# Patient Record
Sex: Female | Born: 1945 | Race: White | Hispanic: No | Marital: Married | Smoking: Never smoker
Health system: Southern US, Community
[De-identification: ages and names within clinical notes are randomized; demographics above are authoritative.]

## PROBLEM LIST (undated history)

## (undated) DIAGNOSIS — N952 Postmenopausal atrophic vaginitis: Secondary | ICD-10-CM

## (undated) DIAGNOSIS — M8580 Other specified disorders of bone density and structure, unspecified site: Secondary | ICD-10-CM

## (undated) DIAGNOSIS — I4729 Other ventricular tachycardia: Secondary | ICD-10-CM

## (undated) DIAGNOSIS — Z889 Allergy status to unspecified drugs, medicaments and biological substances status: Secondary | ICD-10-CM

## (undated) DIAGNOSIS — R87610 Atypical squamous cells of undetermined significance on cytologic smear of cervix (ASC-US): Secondary | ICD-10-CM

## (undated) DIAGNOSIS — B009 Herpesviral infection, unspecified: Secondary | ICD-10-CM

## (undated) DIAGNOSIS — I2510 Atherosclerotic heart disease of native coronary artery without angina pectoris: Secondary | ICD-10-CM

## (undated) DIAGNOSIS — S199XXA Unspecified injury of neck, initial encounter: Secondary | ICD-10-CM

## (undated) DIAGNOSIS — I10 Essential (primary) hypertension: Secondary | ICD-10-CM

## (undated) HISTORY — PX: TONSILLECTOMY: SHX5217

## (undated) HISTORY — DX: Atypical squamous cells of undetermined significance on cytologic smear of cervix (ASC-US): R87.610

## (undated) HISTORY — DX: Essential (primary) hypertension: I10

## (undated) HISTORY — PX: FOOT SURGERY: SHX648

## (undated) HISTORY — DX: Atherosclerotic heart disease of native coronary artery without angina pectoris: I25.10

## (undated) HISTORY — DX: Other ventricular tachycardia: I47.29

## (undated) HISTORY — PX: MOUTH SURGERY: SHX715

## (undated) HISTORY — DX: Unspecified injury of neck, initial encounter: S19.9XXA

## (undated) HISTORY — DX: Other specified disorders of bone density and structure, unspecified site: M85.80

## (undated) HISTORY — DX: Herpesviral infection, unspecified: B00.9

## (undated) HISTORY — DX: Allergy status to unspecified drugs, medicaments and biological substances: Z88.9

## (undated) HISTORY — DX: Postmenopausal atrophic vaginitis: N95.2

---
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
## 2000-09-08 ENCOUNTER — Encounter: Payer: Self-pay | Admitting: Internal Medicine

## 2000-09-08 ENCOUNTER — Encounter: Admission: RE | Admit: 2000-09-08 | Discharge: 2000-09-08 | Payer: Self-pay | Admitting: Internal Medicine

## 2000-10-30 ENCOUNTER — Other Ambulatory Visit: Admission: RE | Admit: 2000-10-30 | Discharge: 2000-10-30 | Payer: Self-pay | Admitting: Obstetrics and Gynecology

## 2000-10-30 ENCOUNTER — Encounter (INDEPENDENT_AMBULATORY_CARE_PROVIDER_SITE_OTHER): Payer: Self-pay | Admitting: Specialist

## 2004-11-01 ENCOUNTER — Ambulatory Visit: Payer: Self-pay | Admitting: Internal Medicine

## 2004-11-14 ENCOUNTER — Ambulatory Visit: Payer: Self-pay | Admitting: Internal Medicine

## 2004-11-21 ENCOUNTER — Ambulatory Visit: Payer: Self-pay | Admitting: Internal Medicine

## 2004-11-28 ENCOUNTER — Ambulatory Visit: Payer: Self-pay | Admitting: Internal Medicine

## 2004-12-12 ENCOUNTER — Ambulatory Visit: Payer: Self-pay | Admitting: Internal Medicine

## 2004-12-19 ENCOUNTER — Ambulatory Visit: Payer: Self-pay | Admitting: Internal Medicine

## 2005-01-07 ENCOUNTER — Ambulatory Visit: Payer: Self-pay | Admitting: Internal Medicine

## 2005-01-22 ENCOUNTER — Ambulatory Visit: Payer: Self-pay | Admitting: Internal Medicine

## 2005-02-06 ENCOUNTER — Ambulatory Visit: Payer: Self-pay | Admitting: Internal Medicine

## 2005-02-20 ENCOUNTER — Ambulatory Visit: Payer: Self-pay | Admitting: Internal Medicine

## 2005-03-06 ENCOUNTER — Ambulatory Visit: Payer: Self-pay | Admitting: Internal Medicine

## 2005-03-20 ENCOUNTER — Ambulatory Visit: Payer: Self-pay | Admitting: Internal Medicine

## 2005-04-03 ENCOUNTER — Ambulatory Visit: Payer: Self-pay | Admitting: Internal Medicine

## 2005-04-25 ENCOUNTER — Ambulatory Visit: Payer: Self-pay | Admitting: Internal Medicine

## 2005-05-10 ENCOUNTER — Ambulatory Visit: Payer: Self-pay | Admitting: Internal Medicine

## 2005-05-22 ENCOUNTER — Ambulatory Visit: Payer: Self-pay | Admitting: Internal Medicine

## 2005-06-14 ENCOUNTER — Ambulatory Visit: Payer: Self-pay | Admitting: Internal Medicine

## 2005-06-25 ENCOUNTER — Ambulatory Visit: Payer: Self-pay | Admitting: Internal Medicine

## 2005-07-10 ENCOUNTER — Ambulatory Visit: Payer: Self-pay | Admitting: Internal Medicine

## 2005-07-31 ENCOUNTER — Ambulatory Visit: Payer: Self-pay | Admitting: Internal Medicine

## 2005-08-14 ENCOUNTER — Ambulatory Visit: Payer: Self-pay | Admitting: Internal Medicine

## 2005-08-29 ENCOUNTER — Ambulatory Visit: Payer: Self-pay | Admitting: Internal Medicine

## 2005-09-04 ENCOUNTER — Ambulatory Visit: Payer: Self-pay | Admitting: Internal Medicine

## 2005-09-10 ENCOUNTER — Ambulatory Visit: Payer: Self-pay | Admitting: Internal Medicine

## 2005-09-16 ENCOUNTER — Ambulatory Visit: Payer: Self-pay | Admitting: Internal Medicine

## 2005-10-02 ENCOUNTER — Ambulatory Visit: Payer: Self-pay | Admitting: Internal Medicine

## 2005-10-16 ENCOUNTER — Ambulatory Visit: Payer: Self-pay | Admitting: Internal Medicine

## 2005-10-23 ENCOUNTER — Ambulatory Visit: Payer: Self-pay | Admitting: Internal Medicine

## 2005-11-11 ENCOUNTER — Ambulatory Visit: Payer: Self-pay | Admitting: Internal Medicine

## 2005-12-10 ENCOUNTER — Ambulatory Visit: Payer: Self-pay | Admitting: Internal Medicine

## 2005-12-19 ENCOUNTER — Ambulatory Visit: Payer: Self-pay | Admitting: Internal Medicine

## 2005-12-27 ENCOUNTER — Ambulatory Visit: Payer: Self-pay | Admitting: Internal Medicine

## 2006-01-06 ENCOUNTER — Ambulatory Visit: Payer: Self-pay | Admitting: Internal Medicine

## 2006-01-15 ENCOUNTER — Ambulatory Visit: Payer: Self-pay | Admitting: Internal Medicine

## 2006-01-21 ENCOUNTER — Ambulatory Visit: Payer: Self-pay | Admitting: Internal Medicine

## 2006-01-29 ENCOUNTER — Ambulatory Visit: Payer: Self-pay | Admitting: Internal Medicine

## 2006-02-12 ENCOUNTER — Ambulatory Visit: Payer: Self-pay | Admitting: Internal Medicine

## 2006-02-28 ENCOUNTER — Ambulatory Visit: Payer: Self-pay | Admitting: Internal Medicine

## 2006-03-17 ENCOUNTER — Ambulatory Visit: Payer: Self-pay | Admitting: Internal Medicine

## 2006-04-02 ENCOUNTER — Ambulatory Visit: Payer: Self-pay | Admitting: Internal Medicine

## 2006-04-16 ENCOUNTER — Ambulatory Visit: Payer: Self-pay | Admitting: Internal Medicine

## 2006-04-29 ENCOUNTER — Ambulatory Visit: Payer: Self-pay | Admitting: Internal Medicine

## 2006-05-14 ENCOUNTER — Ambulatory Visit: Payer: Self-pay | Admitting: Internal Medicine

## 2006-05-28 ENCOUNTER — Ambulatory Visit: Payer: Self-pay | Admitting: Internal Medicine

## 2006-05-29 ENCOUNTER — Ambulatory Visit: Payer: Self-pay | Admitting: Internal Medicine

## 2006-06-06 ENCOUNTER — Ambulatory Visit: Payer: Self-pay | Admitting: Internal Medicine

## 2006-07-04 ENCOUNTER — Ambulatory Visit: Payer: Self-pay | Admitting: Internal Medicine

## 2006-07-07 ENCOUNTER — Ambulatory Visit: Payer: Self-pay | Admitting: Internal Medicine

## 2006-07-22 ENCOUNTER — Ambulatory Visit: Payer: Self-pay | Admitting: Internal Medicine

## 2006-08-28 ENCOUNTER — Ambulatory Visit: Payer: Self-pay | Admitting: Internal Medicine

## 2006-09-12 ENCOUNTER — Ambulatory Visit: Payer: Self-pay | Admitting: Internal Medicine

## 2006-09-16 ENCOUNTER — Ambulatory Visit: Payer: Self-pay | Admitting: Internal Medicine

## 2006-09-23 ENCOUNTER — Ambulatory Visit: Payer: Self-pay | Admitting: Internal Medicine

## 2006-10-01 ENCOUNTER — Ambulatory Visit: Payer: Self-pay | Admitting: Internal Medicine

## 2006-10-10 ENCOUNTER — Ambulatory Visit: Payer: Self-pay | Admitting: Internal Medicine

## 2006-10-23 ENCOUNTER — Ambulatory Visit: Payer: Self-pay | Admitting: Internal Medicine

## 2006-11-19 ENCOUNTER — Encounter: Admission: RE | Admit: 2006-11-19 | Discharge: 2007-02-17 | Payer: Self-pay | Admitting: Emergency Medicine

## 2006-11-25 ENCOUNTER — Ambulatory Visit: Payer: Self-pay | Admitting: Internal Medicine

## 2006-12-03 ENCOUNTER — Ambulatory Visit: Payer: Self-pay | Admitting: Internal Medicine

## 2006-12-19 ENCOUNTER — Ambulatory Visit: Payer: Self-pay | Admitting: Internal Medicine

## 2006-12-25 ENCOUNTER — Ambulatory Visit: Payer: Self-pay | Admitting: Internal Medicine

## 2006-12-30 ENCOUNTER — Ambulatory Visit: Payer: Self-pay | Admitting: Internal Medicine

## 2007-01-12 ENCOUNTER — Ambulatory Visit: Payer: Self-pay | Admitting: Internal Medicine

## 2007-02-03 ENCOUNTER — Ambulatory Visit: Payer: Self-pay | Admitting: Internal Medicine

## 2007-02-18 ENCOUNTER — Ambulatory Visit: Payer: Self-pay | Admitting: Internal Medicine

## 2007-03-06 ENCOUNTER — Ambulatory Visit: Payer: Self-pay | Admitting: Internal Medicine

## 2007-03-17 ENCOUNTER — Ambulatory Visit: Payer: Self-pay | Admitting: Internal Medicine

## 2007-04-09 ENCOUNTER — Ambulatory Visit: Payer: Self-pay | Admitting: Internal Medicine

## 2007-04-24 ENCOUNTER — Ambulatory Visit: Payer: Self-pay | Admitting: Internal Medicine

## 2007-04-28 ENCOUNTER — Ambulatory Visit: Payer: Self-pay | Admitting: Internal Medicine

## 2007-05-11 ENCOUNTER — Ambulatory Visit: Payer: Self-pay | Admitting: Internal Medicine

## 2007-06-10 ENCOUNTER — Ambulatory Visit: Payer: Self-pay | Admitting: Internal Medicine

## 2007-06-24 ENCOUNTER — Ambulatory Visit: Payer: Self-pay | Admitting: Internal Medicine

## 2007-07-16 ENCOUNTER — Ambulatory Visit: Payer: Self-pay | Admitting: Internal Medicine

## 2007-07-21 ENCOUNTER — Ambulatory Visit: Payer: Self-pay | Admitting: Internal Medicine

## 2007-07-22 ENCOUNTER — Ambulatory Visit: Payer: Self-pay | Admitting: Internal Medicine

## 2007-08-05 ENCOUNTER — Ambulatory Visit: Payer: Self-pay | Admitting: Internal Medicine

## 2007-09-15 ENCOUNTER — Ambulatory Visit: Payer: Self-pay | Admitting: Internal Medicine

## 2007-09-25 ENCOUNTER — Ambulatory Visit: Payer: Self-pay | Admitting: Internal Medicine

## 2007-10-05 ENCOUNTER — Ambulatory Visit: Payer: Self-pay | Admitting: Internal Medicine

## 2007-10-07 ENCOUNTER — Ambulatory Visit: Payer: Self-pay | Admitting: Internal Medicine

## 2007-10-27 ENCOUNTER — Ambulatory Visit: Payer: Self-pay | Admitting: Internal Medicine

## 2007-11-03 ENCOUNTER — Ambulatory Visit: Payer: Self-pay | Admitting: Internal Medicine

## 2007-11-16 ENCOUNTER — Ambulatory Visit: Payer: Self-pay | Admitting: Internal Medicine

## 2007-12-02 ENCOUNTER — Ambulatory Visit: Payer: Self-pay | Admitting: Internal Medicine

## 2007-12-09 ENCOUNTER — Ambulatory Visit: Payer: Self-pay | Admitting: Internal Medicine

## 2007-12-11 ENCOUNTER — Encounter: Admission: RE | Admit: 2007-12-11 | Discharge: 2008-01-29 | Payer: Self-pay | Admitting: Emergency Medicine

## 2007-12-18 ENCOUNTER — Ambulatory Visit: Payer: Self-pay | Admitting: Internal Medicine

## 2008-01-01 ENCOUNTER — Ambulatory Visit: Payer: Self-pay | Admitting: Internal Medicine

## 2008-01-15 ENCOUNTER — Ambulatory Visit: Payer: Self-pay | Admitting: Internal Medicine

## 2008-01-18 ENCOUNTER — Ambulatory Visit: Payer: Self-pay | Admitting: Internal Medicine

## 2008-01-20 ENCOUNTER — Ambulatory Visit: Payer: Self-pay | Admitting: Internal Medicine

## 2008-01-27 ENCOUNTER — Ambulatory Visit: Payer: Self-pay | Admitting: Internal Medicine

## 2008-02-12 ENCOUNTER — Telehealth: Payer: Self-pay | Admitting: Internal Medicine

## 2008-02-24 ENCOUNTER — Ambulatory Visit: Payer: Self-pay | Admitting: Internal Medicine

## 2008-02-29 ENCOUNTER — Ambulatory Visit: Payer: Self-pay | Admitting: Internal Medicine

## 2008-03-18 ENCOUNTER — Ambulatory Visit: Payer: Self-pay | Admitting: Internal Medicine

## 2008-03-30 ENCOUNTER — Ambulatory Visit: Payer: Self-pay | Admitting: Internal Medicine

## 2008-04-05 ENCOUNTER — Telehealth: Payer: Self-pay | Admitting: Internal Medicine

## 2008-04-08 ENCOUNTER — Ambulatory Visit: Payer: Self-pay | Admitting: Internal Medicine

## 2008-04-14 DIAGNOSIS — J4520 Mild intermittent asthma, uncomplicated: Secondary | ICD-10-CM

## 2008-04-14 DIAGNOSIS — J309 Allergic rhinitis, unspecified: Secondary | ICD-10-CM | POA: Insufficient documentation

## 2008-04-20 ENCOUNTER — Ambulatory Visit: Payer: Self-pay | Admitting: Internal Medicine

## 2008-05-11 ENCOUNTER — Ambulatory Visit: Payer: Self-pay | Admitting: Internal Medicine

## 2008-06-02 ENCOUNTER — Ambulatory Visit: Payer: Self-pay | Admitting: Internal Medicine

## 2008-06-20 ENCOUNTER — Ambulatory Visit: Payer: Self-pay | Admitting: Internal Medicine

## 2008-07-06 ENCOUNTER — Ambulatory Visit: Payer: Self-pay | Admitting: Internal Medicine

## 2008-07-15 ENCOUNTER — Ambulatory Visit: Payer: Self-pay | Admitting: Internal Medicine

## 2008-07-29 ENCOUNTER — Ambulatory Visit: Payer: Self-pay | Admitting: Internal Medicine

## 2008-08-12 ENCOUNTER — Ambulatory Visit: Payer: Self-pay | Admitting: Internal Medicine

## 2008-08-19 ENCOUNTER — Ambulatory Visit: Payer: Self-pay | Admitting: Internal Medicine

## 2008-08-31 ENCOUNTER — Ambulatory Visit: Payer: Self-pay | Admitting: Internal Medicine

## 2008-09-06 ENCOUNTER — Ambulatory Visit: Payer: Self-pay | Admitting: Internal Medicine

## 2008-09-21 ENCOUNTER — Ambulatory Visit: Payer: Self-pay | Admitting: Internal Medicine

## 2008-09-26 ENCOUNTER — Ambulatory Visit: Payer: Self-pay | Admitting: Internal Medicine

## 2008-10-04 ENCOUNTER — Ambulatory Visit: Payer: Self-pay | Admitting: Internal Medicine

## 2008-10-17 ENCOUNTER — Telehealth (INDEPENDENT_AMBULATORY_CARE_PROVIDER_SITE_OTHER): Payer: Self-pay | Admitting: *Deleted

## 2008-11-14 ENCOUNTER — Telehealth: Payer: Self-pay | Admitting: Internal Medicine

## 2008-11-24 ENCOUNTER — Ambulatory Visit: Payer: Self-pay | Admitting: Internal Medicine

## 2008-11-30 ENCOUNTER — Ambulatory Visit: Payer: Self-pay | Admitting: Internal Medicine

## 2008-12-09 ENCOUNTER — Ambulatory Visit: Payer: Self-pay | Admitting: Internal Medicine

## 2008-12-22 ENCOUNTER — Ambulatory Visit: Payer: Self-pay | Admitting: Internal Medicine

## 2008-12-27 ENCOUNTER — Ambulatory Visit: Payer: Self-pay | Admitting: Internal Medicine

## 2009-01-03 ENCOUNTER — Telehealth (INDEPENDENT_AMBULATORY_CARE_PROVIDER_SITE_OTHER): Payer: Self-pay | Admitting: *Deleted

## 2009-01-13 ENCOUNTER — Ambulatory Visit: Payer: Self-pay | Admitting: Internal Medicine

## 2009-01-23 ENCOUNTER — Ambulatory Visit: Payer: Self-pay | Admitting: Internal Medicine

## 2009-01-31 ENCOUNTER — Ambulatory Visit: Payer: Self-pay | Admitting: Internal Medicine

## 2009-02-02 ENCOUNTER — Ambulatory Visit: Payer: Self-pay | Admitting: Internal Medicine

## 2009-02-06 ENCOUNTER — Ambulatory Visit: Payer: Self-pay | Admitting: Internal Medicine

## 2009-02-28 ENCOUNTER — Ambulatory Visit: Payer: Self-pay | Admitting: Internal Medicine

## 2009-03-09 ENCOUNTER — Ambulatory Visit: Payer: Self-pay | Admitting: Internal Medicine

## 2009-03-14 ENCOUNTER — Ambulatory Visit: Payer: Self-pay | Admitting: Internal Medicine

## 2009-03-21 ENCOUNTER — Ambulatory Visit: Payer: Self-pay | Admitting: Internal Medicine

## 2009-03-28 ENCOUNTER — Ambulatory Visit: Payer: Self-pay | Admitting: Internal Medicine

## 2009-04-04 ENCOUNTER — Ambulatory Visit: Payer: Self-pay | Admitting: Internal Medicine

## 2009-04-13 ENCOUNTER — Ambulatory Visit: Payer: Self-pay | Admitting: Internal Medicine

## 2009-04-19 ENCOUNTER — Ambulatory Visit: Payer: Self-pay | Admitting: Internal Medicine

## 2009-04-25 ENCOUNTER — Ambulatory Visit: Payer: Self-pay | Admitting: Internal Medicine

## 2009-05-05 ENCOUNTER — Ambulatory Visit: Payer: Self-pay | Admitting: Internal Medicine

## 2009-05-10 ENCOUNTER — Ambulatory Visit: Payer: Self-pay | Admitting: Internal Medicine

## 2009-05-23 ENCOUNTER — Ambulatory Visit: Payer: Self-pay | Admitting: Internal Medicine

## 2009-06-07 ENCOUNTER — Ambulatory Visit: Payer: Self-pay | Admitting: Internal Medicine

## 2009-06-16 ENCOUNTER — Ambulatory Visit: Payer: Self-pay | Admitting: Internal Medicine

## 2009-06-21 ENCOUNTER — Ambulatory Visit: Payer: Self-pay | Admitting: Internal Medicine

## 2009-06-28 ENCOUNTER — Ambulatory Visit: Payer: Self-pay | Admitting: Internal Medicine

## 2009-07-04 ENCOUNTER — Ambulatory Visit: Payer: Self-pay | Admitting: Internal Medicine

## 2009-07-24 ENCOUNTER — Ambulatory Visit: Payer: Self-pay | Admitting: Internal Medicine

## 2009-08-08 ENCOUNTER — Encounter: Admission: RE | Admit: 2009-08-08 | Discharge: 2009-10-02 | Payer: Self-pay | Admitting: Emergency Medicine

## 2009-08-14 ENCOUNTER — Ambulatory Visit: Payer: Self-pay | Admitting: Internal Medicine

## 2009-08-21 ENCOUNTER — Ambulatory Visit: Payer: Self-pay | Admitting: Internal Medicine

## 2009-08-31 ENCOUNTER — Ambulatory Visit: Payer: Self-pay | Admitting: Internal Medicine

## 2009-09-11 ENCOUNTER — Ambulatory Visit: Payer: Self-pay | Admitting: Internal Medicine

## 2009-09-27 ENCOUNTER — Ambulatory Visit: Payer: Self-pay | Admitting: Internal Medicine

## 2009-10-05 ENCOUNTER — Ambulatory Visit: Payer: Self-pay | Admitting: Internal Medicine

## 2009-10-17 ENCOUNTER — Ambulatory Visit: Payer: Self-pay | Admitting: Internal Medicine

## 2009-10-19 ENCOUNTER — Ambulatory Visit: Payer: Self-pay | Admitting: Internal Medicine

## 2009-10-25 ENCOUNTER — Ambulatory Visit: Payer: Self-pay | Admitting: Internal Medicine

## 2009-10-31 ENCOUNTER — Ambulatory Visit: Payer: Self-pay | Admitting: Internal Medicine

## 2009-11-13 ENCOUNTER — Ambulatory Visit: Payer: Self-pay | Admitting: Internal Medicine

## 2009-11-23 ENCOUNTER — Ambulatory Visit: Payer: Self-pay | Admitting: Internal Medicine

## 2009-12-04 ENCOUNTER — Ambulatory Visit: Payer: Self-pay | Admitting: Internal Medicine

## 2009-12-13 ENCOUNTER — Telehealth (INDEPENDENT_AMBULATORY_CARE_PROVIDER_SITE_OTHER): Payer: Self-pay | Admitting: *Deleted

## 2009-12-13 ENCOUNTER — Ambulatory Visit: Payer: Self-pay | Admitting: Internal Medicine

## 2009-12-14 ENCOUNTER — Ambulatory Visit: Payer: Self-pay | Admitting: Internal Medicine

## 2009-12-19 ENCOUNTER — Ambulatory Visit: Payer: Self-pay | Admitting: Internal Medicine

## 2009-12-26 ENCOUNTER — Ambulatory Visit: Payer: Self-pay | Admitting: Internal Medicine

## 2010-01-08 ENCOUNTER — Ambulatory Visit: Payer: Self-pay | Admitting: Internal Medicine

## 2010-01-17 ENCOUNTER — Ambulatory Visit: Payer: Self-pay | Admitting: Internal Medicine

## 2010-01-26 ENCOUNTER — Ambulatory Visit: Payer: Self-pay | Admitting: Internal Medicine

## 2010-01-31 ENCOUNTER — Ambulatory Visit: Payer: Self-pay | Admitting: Internal Medicine

## 2010-02-05 ENCOUNTER — Encounter: Admission: RE | Admit: 2010-02-05 | Discharge: 2010-02-05 | Payer: Self-pay | Admitting: Obstetrics and Gynecology

## 2010-02-06 ENCOUNTER — Ambulatory Visit: Payer: Self-pay | Admitting: Internal Medicine

## 2010-02-13 ENCOUNTER — Ambulatory Visit: Payer: Self-pay | Admitting: Internal Medicine

## 2010-02-21 ENCOUNTER — Ambulatory Visit: Payer: Self-pay | Admitting: Internal Medicine

## 2010-02-28 ENCOUNTER — Ambulatory Visit: Payer: Self-pay | Admitting: Internal Medicine

## 2010-03-06 ENCOUNTER — Ambulatory Visit: Payer: Self-pay | Admitting: Internal Medicine

## 2010-03-21 ENCOUNTER — Ambulatory Visit: Payer: Self-pay | Admitting: Internal Medicine

## 2010-03-30 ENCOUNTER — Ambulatory Visit: Payer: Self-pay | Admitting: Internal Medicine

## 2010-04-09 ENCOUNTER — Ambulatory Visit: Payer: Self-pay | Admitting: Internal Medicine

## 2010-04-26 ENCOUNTER — Ambulatory Visit: Payer: Self-pay | Admitting: Internal Medicine

## 2010-05-09 ENCOUNTER — Ambulatory Visit: Payer: Self-pay | Admitting: Internal Medicine

## 2010-05-18 ENCOUNTER — Ambulatory Visit: Payer: Self-pay | Admitting: Internal Medicine

## 2010-05-25 ENCOUNTER — Ambulatory Visit: Payer: Self-pay | Admitting: Internal Medicine

## 2010-05-31 ENCOUNTER — Ambulatory Visit: Payer: Self-pay | Admitting: Internal Medicine

## 2010-06-21 ENCOUNTER — Ambulatory Visit: Payer: Self-pay | Admitting: Internal Medicine

## 2010-06-27 ENCOUNTER — Telehealth (INDEPENDENT_AMBULATORY_CARE_PROVIDER_SITE_OTHER): Payer: Self-pay | Admitting: *Deleted

## 2010-06-29 ENCOUNTER — Ambulatory Visit: Payer: Self-pay | Admitting: Internal Medicine

## 2010-06-29 LAB — CONVERTED CEMR LAB: Tissue Transglutaminase Ab, IgA: 16.3 units (ref <20)

## 2010-07-06 ENCOUNTER — Telehealth: Payer: Self-pay | Admitting: Internal Medicine

## 2010-07-06 ENCOUNTER — Ambulatory Visit: Payer: Self-pay | Admitting: Internal Medicine

## 2010-07-09 ENCOUNTER — Ambulatory Visit: Payer: Self-pay | Admitting: Internal Medicine

## 2010-07-23 ENCOUNTER — Ambulatory Visit: Payer: Self-pay | Admitting: Internal Medicine

## 2010-07-26 ENCOUNTER — Ambulatory Visit: Payer: Self-pay | Admitting: Gynecology

## 2010-07-26 ENCOUNTER — Other Ambulatory Visit: Admission: RE | Admit: 2010-07-26 | Discharge: 2010-07-26 | Payer: Self-pay | Admitting: Gynecology

## 2010-08-08 ENCOUNTER — Ambulatory Visit: Payer: Self-pay | Admitting: Internal Medicine

## 2010-08-23 ENCOUNTER — Ambulatory Visit: Payer: Self-pay | Admitting: Internal Medicine

## 2010-09-04 ENCOUNTER — Ambulatory Visit: Payer: Self-pay | Admitting: Internal Medicine

## 2010-09-17 ENCOUNTER — Ambulatory Visit: Payer: Self-pay | Admitting: Internal Medicine

## 2010-09-27 ENCOUNTER — Ambulatory Visit: Payer: Self-pay | Admitting: Internal Medicine

## 2010-10-15 ENCOUNTER — Ambulatory Visit: Payer: Self-pay | Admitting: Internal Medicine

## 2010-11-12 ENCOUNTER — Ambulatory Visit: Payer: Self-pay | Admitting: Internal Medicine

## 2010-11-16 ENCOUNTER — Encounter: Payer: Self-pay | Admitting: Internal Medicine

## 2010-11-22 ENCOUNTER — Ambulatory Visit: Payer: Self-pay | Admitting: Internal Medicine

## 2010-11-28 DIAGNOSIS — J301 Allergic rhinitis due to pollen: Secondary | ICD-10-CM

## 2010-11-29 NOTE — Assessment & Plan Note (Signed)
Summary: yearly appt/cb   Visit Type:  Follow-up Primary Provider/Referring Provider:  Lajean Manes  CC:  1 year follow up.Carrie Cox  History of Present Illness: October 19, 2009- Allergic rhinits, Asthma, Hx yellow jacket hypersensitivity Had Flu  vax. Has had at least one pneumovax- paper chart. She has done very well. Asthma not active, butr she coughs regularly at work- treated with hot tea. Building is clean. Little cough at home. She continues allergy vaccine. Dog serum is still given separately since we added it on- discussed. She is satisfied with present care. Allergy lab is set to merge dog vaccine with other vials as it reaches same concentration.  October 15, 2010- Allergic rhinits, Asthma, Hx yellow jacket hypersensitivity Nurse-CC: 1 year follow up. Since last here she had called with concern, leading to negative screening labs looking for celiac/ gluten intolerance. She continues to find foods she associates with gluten cause GI upset and that she does better avoiding gluten.  She has done well with allergy vaccinea nd asks now about increasing the interval.  More easily winded on stairs since not exercising after foot surgery.    Asthma History    Initial Asthma Severity Rating:    Age range: 12+ years    Symptoms: 0-2 days/week    Nighttime Awakenings: 0-2/month    Interferes w/ normal activity: no limitations    SABA use (not for EIB): 0-2 days/week    Asthma Severity Assessment: Intermittent   Preventive Screening-Counseling & Management  Alcohol-Tobacco     Smoking Status: never  Current Medications (verified): 1)  Asmanex 120 Metered Doses 220 Mcg/inh Aepb (Mometasone Furoate) .Carrie Cox.. 1 Puff Once Daily 2)  Astelin 137 Mcg/spray  Soln (Azelastine Hcl) .... 2 Sprays Once Daily 3)  Epipen 0.3 Mg/0.33ml (1:1000)  Devi (Epinephrine Hcl (Anaphylaxis)) .... As Needed 4)  Singulair 10 Mg Tabs (Montelukast Sodium) .... Take 1 By Mouth Once Daily 5)  Flonase 50 Mcg/act Susp  (Fluticasone Propionate) .... Use As Directed As Needed 6)  Proair Hfa 108 (90 Base) Mcg/act Aers (Albuterol Sulfate) .... 2 Puffs Four Times A Day As Needed 7)  Allergy Vaccine  G.h. .... Dog: 1:50 Left and Right: 1:10  Allergies (verified): 1)  ! Codeine 2)  ! Erythromycin  Past History:  Past Medical History: Last updated: 10/08/2008  ASTHMA (ICD-493.90) ALLERGIC RHINITIS (ICD-477.9) Insect venom sensitivity- yellow jacket  Family History: Last updated: November 21, 2008 Mother-deceased age 11; CHF, DM, asthma;allergies Father- living age 55; MD, Pulmonary hypertension, CHF;allergies Sibling 1- living age 100; clotting disorder, MVP, Migraines; allergies Sibling 2- living age 62; DM, allergies Sibling 3-living age50  Social History: Last updated: 10/19/2009 Patient never smoked.  Positive history of passive tobacco smoke exposure.  Exercises 5 times week ETOH-occasional Married; no children. 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  Risk Factors: Smoking Status: never (10/15/2010) Passive Smoke Exposure: yes (2008/11/21)  Past Surgical History: Tonsillectomy Foot surgery 2011  Review of Systems      See HPI       The patient complains of shortness of breath with activity.  The patient denies shortness of breath at rest, productive cough, non-productive cough, coughing up blood, chest pain, irregular heartbeats, acid heartburn, indigestion, loss of appetite, weight change, abdominal pain, difficulty swallowing, sore throat, tooth/dental problems, headaches, nasal congestion/difficulty breathing through nose, sneezing, itching, ear ache, anxiety, rash, change in color of mucus, and fever.    Vital Signs:  Patient profile:   65 year old  female Height:      64 inches Weight:      142 pounds BMI:     24.46 O2 Sat:      96 % on Room air Pulse rate:   78 / minute BP sitting:   134 / 76  (left arm) Cuff size:   regular  Vitals  Entered By: Carver Fila (October 15, 2010 9:16 AM)  O2 Flow:  Room air  Physical Exam  Additional Exam:  General: A/Ox3; pleasant and cooperative, NAD, fit-appearing SKIN: no rash, lesions NODES: no lymphadenopathy HEENT: [place]/AT, EOM- WNL, Conjuctivae- clear, PERRLA, TM-WNL, Nose- clear, Throat- clear and wnl, Mallampati  II NECK: Supple w/ fair ROM, JVD- none, normal carotid impulses w/o bruits Thyroid-  CHEST: Clear to P&A HEART: RRR, no m/g/r heard ABDOMEN: Soft and nl; ZOX:WRUE, nl pulses, no edema  NEURO: Grossly intact to observation      Impression & Recommendations:  Problem # 1:  ALLERGIC RHINITIS (ICD-477.9)  She has done very well at 1:10 on her vaccine. We wil have her increase the interval to every 2 weeks now.  Her updated medication list for this problem includes:    Astelin 137 Mcg/spray Soln (Azelastine hcl) .Carrie Cox... 2 sprays once daily    Flonase 50 Mcg/act Susp (Fluticasone propionate) ..... Use as directed as needed  Problem # 2:  ASTHMA (ICD-493.90) Good control She is noting a little deconditioning effect but that should clear as she is able to resume dancing.   Medications Added to Medication List This Visit: 1)  Asmanex 120 Metered Doses 220 Mcg/inh Aepb (Mometasone furoate) .Carrie Cox.. 1 puff once daily 2)  Asmanex 30 Metered Doses 110 Mcg/inh Aepb (Mometasone furoate) .Carrie Cox.. 1 puff and rinse mouth once daily or as directed. 3)  Astelin 137 Mcg/spray Soln (Azelastine hcl) .... 2 sprays once daily 4)  Zithromax Z-pak 250 Mg Tabs (Azithromycin) .... 2 today then one daily  Other Orders: Est. Patient Level III (45409)   Patient Instructions: 1)  Please schedule a follow-up appointment in 1 year.  Please call sooner as needed.  2)  OK to increase your allergy vaccine interval to every 2 weeks.  3)  Ok to try taking Asmanex overy other day, especially in "quiet" seasons. Be quick to go back up to twice daily if you feel you are losing control.   Prescriptions: ASMANEX 30 METERED DOSES 110 MCG/INH AEPB (MOMETASONE FUROATE) 1 puff and rinse mouth once daily or as directed.  #3 x 3   Entered and Authorized by:   Waymon Budge MD   Signed by:   Waymon Budge MD on 10/15/2010   Method used:   Print then Give to Patient   RxID:   [id number] ZITHROMAX Z-PAK 250 MG TABS (AZITHROMYCIN) 2 today then one daily  #1 pak x 3   Entered and Authorized by:   Waymon Budge MD   Signed by:   Waymon Budge MD on 10/15/2010   Method used:   Print then Give to Patient   RxID:   [id number] PROAIR HFA 108 (90 BASE) MCG/ACT AERS (ALBUTEROL SULFATE) 2 puffs four times a day as needed  #1 x prn   Entered and Authorized by:   Waymon Budge MD   Signed by:   Waymon Budge MD on 10/15/2010   Method used:   Electronically to        [facility]* (retail)             ,  Ph: [phone]       Fax: [phone]   RxID:   [id number] FLONASE 50 MCG/ACT SUSP (FLUTICASONE PROPIONATE) Use as directed as needed  #3 x 3   Entered and Authorized by:   Waymon Budge MD   Signed by:   Waymon Budge MD on 10/15/2010   Method used:   Electronically to        [facility]* (retail)             ,          Ph: [phone]       Fax: [phone]   RxID:   [id number] SINGULAIR 10 MG TABS (MONTELUKAST SODIUM) take 1 by mouth once daily  #90 x 3   Entered and Authorized by:   Waymon Budge MD   Signed by:   Waymon Budge MD on 10/15/2010   Method used:   Electronically to        [facility]* (retail)             ,          Ph: [phone]       Fax: [phone]   RxID:   [id number] EPIPEN 0.3 MG/0.3ML (1:1000)  DEVI (EPINEPHRINE HCL (ANAPHYLAXIS)) as needed  #1 x prn   Entered and Authorized by:   Waymon Budge MD   Signed by:   Waymon Budge MD on 10/15/2010   Method used:   Electronically to        [facility]* (retail)             ,          Ph: [phone]       Fax: [phone]    RxID:   [id number] ASTELIN 137 MCG/SPRAY  SOLN (AZELASTINE HCL) 2 sprays once daily  #3 x 3   Entered and Authorized by:   Waymon Budge MD   Signed by:   Waymon Budge MD on 10/15/2010   Method used:   Electronically to        [facility]* (retail)             ,          Ph: [phone]       Fax: [phone]   RxID:   [id number]    Immunization History:  Influenza Immunization History:    Influenza:  historical (06/28/2010)

## 2010-11-29 NOTE — Progress Notes (Signed)
Summary: gluten allergy testing - Pt to come in 06/29/10  Phone Note Call from Patient Call back at Work Phone [phone]   Caller: Patient Call For: YOUNG Summary of Call: does dr young do a blood test for "gluten intollerance"?  Initial call taken by: Tivis Ringer, CNA,  June 27, 2010 11:58 AM  Follow-up for Phone Call        Spoke with pt.  She states that her nutritionist told her that she should be tested to gluten allergy.  She states that she thinks this may have already been done when she had blood test for food allergies.  This was done approx 10 yrs ago- nothing was scanned in EMR regarding this so requested paper chart Vernie Murders  June 27, 2010 12:04 PM   paper chart received.  i see no clear indication that a gluten allergy was tested.  will forward to CDY.   Boone Master CNA/MA  June 27, 2010 4:57 PM   Additional Follow-up for Phone Call Additional follow up Details #1::        Very old skin tests were positive for several foods, including wheat, but that is not the same as Celiac disease or gluten sensitivity.  We can send blood work if she would like [place] to. LMK and i will order. Additional Follow-up by: Waymon Budge MD,  June 28, 2010 8:47 AM    Additional Follow-up for Phone Call Additional follow up Details #2::    LMTCBx1. Carron Curie CMA  June 28, 2010 9:21 AM  Spoke with pt and advised of the above recs per CDY.  She states that she is interested in having blood work done for this.  Would like to come to lab 06/29/10 am.  Pls advise what test to order exactly and I can put order in.  Thank you! Follow-up by: Vernie Murders,  June 28, 2010 3:03 PM  Additional Follow-up for Phone Call Additional follow up Details #3:: Details for Additional Follow-up Action Taken: Left message on named Machine that pt may come in any time tomorrow for blood work. Labs already put in emr and idx  [facility]  June 28, 2010 3:26 PM

## 2010-11-29 NOTE — Miscellaneous (Signed)
Summary: Injection Record / [facility]    Injection Record /  Allergy    Imported By: Lennie Odor 07/03/2010 15:55:13  _____________________________________________________________________  External Attachment:    Type:   Image     Comment:   External Document

## 2010-11-29 NOTE — Miscellaneous (Signed)
Summary: Injection Record/[facility]  Injection Record/[facility]   Imported By: Sherian Rein 03/06/2010 07:42:04  _____________________________________________________________________  External Attachment:    Type:   Image     Comment:   External Document

## 2010-11-29 NOTE — Progress Notes (Signed)
Summary: ALLERGY Merging Dog into maintenance vaccine mix.  Phone Note Other Incoming   Caller: Pt. came in for all.shot. Details for Reason: Time to combine dog. Summary of Call: We are ready to combine dog into Mrs. Punches's vac.Marland KitchenWhich vial do you want it in?  Vial A:grass,ragweed,oak,hickory,cat. Vial B: dust,dust mite,cedar,sweet gum,sycamore.  Initial call taken by: Dimas Millin,  December 13, 2009 1:53 PM  Follow-up for Phone Call        Put dog into B vial Follow-up by: Waymon Budge MD,  December 13, 2009 1:55 PM

## 2010-11-29 NOTE — Miscellaneous (Signed)
Summary: Injection Record/[facility]  Injection Record/[facility]   Imported By: Sherian Rein 03/20/2010 12:45:00  _____________________________________________________________________  External Attachment:    Type:   Image     Comment:   External Document

## 2010-11-29 NOTE — Miscellaneous (Signed)
Summary: Injection record/[facility]  Injection record/[facility]   Imported By: Sherian Rein 03/20/2010 12:10:38  _____________________________________________________________________  External Attachment:    Type:   Image     Comment:   External Document

## 2010-11-29 NOTE — Miscellaneous (Signed)
Summary: Injection Record/[facility]  Injection Record/ Allergy   Imported By: Sherian Rein 03/20/2010 07:37:18  _____________________________________________________________________  External Attachment:    Type:   Image     Comment:   External Document

## 2010-11-29 NOTE — Miscellaneous (Signed)
Summary: Injection record/[facility]  Injection record/[facility]   Imported By: Lester [place] 05/23/2010 09:34:43  _____________________________________________________________________  External Attachment:    Type:   Image     Comment:   External Document

## 2010-11-29 NOTE — Progress Notes (Signed)
Summary: results  Phone Note Call from Patient Call back at Work Phone [phone]   Caller: Patient Call For: young Reason for Call: Talk to Nurse Summary of Call: pt would like results of bloodwork done on 09/02 called to her and also a copy sent to her. Initial call taken by: Eugene Gavia,  July 06, 2010 1:08 PM  Follow-up for Phone Call        Dr. Maple Hudson, please advise results of labs from 06/29/10.  I have printed these for your review. Follow-up by: Vernie Murders,  July 06, 2010 1:11 PM     Appended Document: results Lab results signed by CY -- LMOMTCB to inform pt of these results.   Appended Document: results Pt aware of results.

## 2010-12-12 ENCOUNTER — Ambulatory Visit (INDEPENDENT_AMBULATORY_CARE_PROVIDER_SITE_OTHER): Payer: 59

## 2010-12-12 DIAGNOSIS — J301 Allergic rhinitis due to pollen: Secondary | ICD-10-CM

## 2010-12-19 NOTE — Miscellaneous (Signed)
Summary: Injection Financial risk analyst   Imported By: Sherian Rein 12/12/2010 14:59:52  _____________________________________________________________________  External Attachment:    Type:   Image     Comment:   External Document

## 2010-12-26 ENCOUNTER — Ambulatory Visit (INDEPENDENT_AMBULATORY_CARE_PROVIDER_SITE_OTHER): Payer: 59

## 2010-12-26 DIAGNOSIS — J301 Allergic rhinitis due to pollen: Secondary | ICD-10-CM

## 2011-01-09 ENCOUNTER — Encounter: Payer: Self-pay | Admitting: Internal Medicine

## 2011-01-09 ENCOUNTER — Ambulatory Visit (INDEPENDENT_AMBULATORY_CARE_PROVIDER_SITE_OTHER): Payer: 59

## 2011-01-09 DIAGNOSIS — J301 Allergic rhinitis due to pollen: Secondary | ICD-10-CM

## 2011-01-09 DIAGNOSIS — J302 Other seasonal allergic rhinitis: Secondary | ICD-10-CM

## 2011-01-09 DIAGNOSIS — J3089 Other allergic rhinitis: Secondary | ICD-10-CM | POA: Insufficient documentation

## 2011-01-15 NOTE — Assessment & Plan Note (Signed)
Summary: allergy/cb  Nurse Visit   Allergies: 1)  ! Codeine 2)  ! Erythromycin  Orders Added: 1)  Allergy Injection (1) [16109]

## 2011-01-25 ENCOUNTER — Ambulatory Visit (INDEPENDENT_AMBULATORY_CARE_PROVIDER_SITE_OTHER): Payer: 59

## 2011-01-25 DIAGNOSIS — J301 Allergic rhinitis due to pollen: Secondary | ICD-10-CM

## 2011-02-07 ENCOUNTER — Ambulatory Visit (INDEPENDENT_AMBULATORY_CARE_PROVIDER_SITE_OTHER): Payer: 59

## 2011-02-07 DIAGNOSIS — J309 Allergic rhinitis, unspecified: Secondary | ICD-10-CM

## 2011-02-07 DIAGNOSIS — M62838 Other muscle spasm: Secondary | ICD-10-CM

## 2011-02-13 MED ORDER — CARISOPRODOL 350 MG PO TABS: 350.0000 mg | ORAL_TABLET | Freq: Four times a day (QID) | ORAL | Status: AC | PRN

## 2011-02-15 ENCOUNTER — Ambulatory Visit (INDEPENDENT_AMBULATORY_CARE_PROVIDER_SITE_OTHER): Payer: 59

## 2011-02-15 DIAGNOSIS — J309 Allergic rhinitis, unspecified: Secondary | ICD-10-CM

## 2011-03-05 ENCOUNTER — Ambulatory Visit (INDEPENDENT_AMBULATORY_CARE_PROVIDER_SITE_OTHER): Payer: 59

## 2011-03-05 DIAGNOSIS — J309 Allergic rhinitis, unspecified: Secondary | ICD-10-CM

## 2011-03-12 NOTE — Assessment & Plan Note (Signed)
[facility]                             PULMONARY OFFICE NOTE   NAME:Carrie Cox, Carrie Cox                      MRN:          [id number]  DATE:09/25/2007                            DOB:          [birth year]    PROBLEM:  1. Allergic rhinitis.  2. Asthma.  3. History of yellow jacket hypersensitivity.   HISTORY:  A 1-year followup.  Has been doing very well.  Notices Foradil  once a day still causes some rapid heartbeat, and she uses it at  intervals p.r.n.  Got a new dog and feels it is aggravating her  rhinitis, although she tolerates her other dogs.  She asks to have dog  built into her vaccine, and I discussed that process, environmental  precautions and standard recommendations.   MEDICATIONS:  1. Allergy vaccine.  2. Flonase.  3. Singulair.  4. Astelin.  5. Lexapro 25 mg.  6. Foradil.  7. Asmanex 110 one puff daily.  8. Albuterol rescue inhaler.  9. Epipen.   She asks Z-Pak to hold for the winter.   Drug intolerant to CODEINE and ERYTHROMYCIN.   OBJECTIVE:  Weight 131 pounds, BP 120/72, pulse 72, room air saturation  98%.  Conjunctivae are clear.  Nasal mucosa looks normal with some clear  mucous bridging.  Pharynx is not irritated.  Breathing is unlabored.  Chest sounds clear.  Heart sounds are regular without murmur or gallop.  No evident rash.   IMPRESSION:  Allergic rhinitis, minimal intermittent asthma, remote  history of yellow jacket sensitivity.  She has had flu shot.   PLAN:  1. Discussed Foradil use, may continue as she is doing.  2. Pneumococcal vaccine booster.  3. Refill Epipen and her other meds with discussion.  4. Z-Pak to hold.  5. We will build dog vaccine as a separate injection until it can be      merged with her current      myocardial infarction.  6. Schedule return 1 year, earlier p.r.n.     Clinton D. Maple Hudson, MD, Tonny Bollman, FACP  Electronically Signed    CDY/MedQ  DD: 09/26/2007  DT: 09/26/2007  Job #:  [id number]   cc:   Oley Balm. Georgina Pillion, M.D.

## 2011-03-15 NOTE — Assessment & Plan Note (Signed)
 [facility]                               PULMONARY OFFICE NOTE   NAME:Carrie Cox, Carrie Cox                      MRN:          [id number]  DATE:09/16/2006                            DOB:          [birth year]    PROBLEMS:  1. Allergic rhinitis.  2. Asthma.  3. History of yellow jacket hypersensitivity.   HISTORY:  This has been a good year.  She finds she does need to stay on her  Advair, because without it she begins to wheeze.  She continues all of her  routine meds, and uses occasional Sudafed appropriately.  She credits an  effect on her brain by her Lexapro for improving her immune system and  reducing the frequency of infections.  I did not try to argue this point.  She has had flu vaccine.  She continues allergy vaccine at 1:10, and has  noted much better toleration with incidental cat exposure.   MEDICATIONS:  1. Allergy vaccine.  2. Flonase.  3. Singulair.  4. Astelin.  5. Advair 100/50.  6. Lexapro 25 mg.  7. Albuterol rescue inhaler.  8. EpiPen.  9. Sudafed.  10.She keeps a Z-pack on hand for trips.   ALLERGIES:  DRUG INTOLERANT OF CODEINE AND ERYTHROMYCIN.   OBJECTIVE:  Weight 130 pounds, BP 126/62, pulse regular 71, room air  saturation 96%.  Eyes and nose are clear.  Lungs are clear to P&A.  Heart sounds regular without murmur.   IMPRESSION:  Allergic rhinitis and mild asthma are well controlled.   PLAN:  1. She wanted pneumococcal vaccine booster, and this was discussed.  She      had an original years ago.  2. She is going to try reducing Advair to once daily.  3. Schedule return in 1 year, earlier p.r.n.     Clinton D. Maple Hudson, MD, Carrie Cox, FACP  Electronically Signed    CDY/MedQ  DD: 09/17/2006  DT: 09/18/2006  Job #: [id number]   cc:   Oley Balm. Georgina Pillion, M.D.

## 2011-03-18 ENCOUNTER — Ambulatory Visit (INDEPENDENT_AMBULATORY_CARE_PROVIDER_SITE_OTHER): Payer: 59

## 2011-03-18 DIAGNOSIS — J309 Allergic rhinitis, unspecified: Secondary | ICD-10-CM

## 2011-04-02 ENCOUNTER — Ambulatory Visit (INDEPENDENT_AMBULATORY_CARE_PROVIDER_SITE_OTHER): Payer: 59

## 2011-04-02 DIAGNOSIS — J309 Allergic rhinitis, unspecified: Secondary | ICD-10-CM

## 2011-04-19 ENCOUNTER — Ambulatory Visit (INDEPENDENT_AMBULATORY_CARE_PROVIDER_SITE_OTHER): Payer: 59

## 2011-04-19 DIAGNOSIS — J309 Allergic rhinitis, unspecified: Secondary | ICD-10-CM

## 2011-04-29 ENCOUNTER — Ambulatory Visit (INDEPENDENT_AMBULATORY_CARE_PROVIDER_SITE_OTHER): Payer: 59

## 2011-04-29 DIAGNOSIS — J309 Allergic rhinitis, unspecified: Secondary | ICD-10-CM

## 2011-05-15 ENCOUNTER — Ambulatory Visit (INDEPENDENT_AMBULATORY_CARE_PROVIDER_SITE_OTHER): Payer: 59

## 2011-05-15 DIAGNOSIS — J309 Allergic rhinitis, unspecified: Secondary | ICD-10-CM

## 2011-05-31 ENCOUNTER — Ambulatory Visit (INDEPENDENT_AMBULATORY_CARE_PROVIDER_SITE_OTHER): Payer: 59

## 2011-05-31 DIAGNOSIS — J309 Allergic rhinitis, unspecified: Secondary | ICD-10-CM

## 2011-06-11 ENCOUNTER — Ambulatory Visit (INDEPENDENT_AMBULATORY_CARE_PROVIDER_SITE_OTHER): Payer: 59

## 2011-06-11 DIAGNOSIS — J309 Allergic rhinitis, unspecified: Secondary | ICD-10-CM

## 2011-06-18 ENCOUNTER — Telehealth: Payer: Self-pay | Admitting: Internal Medicine

## 2011-06-18 MED ORDER — FLUTICASONE PROPIONATE 50 MCG/ACT NA SUSP: 2.0000 | Freq: Every day | NASAL | Status: DC

## 2011-06-18 NOTE — Telephone Encounter (Signed)
PT RETURNED CALL FROM TRIAGE.  °

## 2011-06-18 NOTE — Telephone Encounter (Signed)
rx printed and mailed to pt. Carron Curie, CMA

## 2011-06-18 NOTE — Telephone Encounter (Signed)
Pt is requesting generic Flonase to be mailed to her home for a 3 month supply. Address in our system verified with pt. I will print phone note to side B and will need for someone to print out for CDY to sign and mail to pt. ATC side B and triage to make aware at [facility] office, no answer.

## 2011-06-18 NOTE — Telephone Encounter (Signed)
lmomtcb x1 

## 2011-06-27 ENCOUNTER — Ambulatory Visit (INDEPENDENT_AMBULATORY_CARE_PROVIDER_SITE_OTHER): Payer: 59

## 2011-06-27 DIAGNOSIS — J309 Allergic rhinitis, unspecified: Secondary | ICD-10-CM

## 2011-06-28 ENCOUNTER — Other Ambulatory Visit: Payer: Self-pay | Admitting: Gynecology

## 2011-07-02 DIAGNOSIS — B009 Herpesviral infection, unspecified: Secondary | ICD-10-CM | POA: Insufficient documentation

## 2011-07-02 DIAGNOSIS — J45909 Unspecified asthma, uncomplicated: Secondary | ICD-10-CM | POA: Insufficient documentation

## 2011-07-02 DIAGNOSIS — Z889 Allergy status to unspecified drugs, medicaments and biological substances status: Secondary | ICD-10-CM | POA: Insufficient documentation

## 2011-07-08 ENCOUNTER — Encounter: Payer: Self-pay | Admitting: Internal Medicine

## 2011-07-17 ENCOUNTER — Ambulatory Visit (INDEPENDENT_AMBULATORY_CARE_PROVIDER_SITE_OTHER): Payer: 59

## 2011-07-17 DIAGNOSIS — J309 Allergic rhinitis, unspecified: Secondary | ICD-10-CM

## 2011-07-29 ENCOUNTER — Ambulatory Visit (INDEPENDENT_AMBULATORY_CARE_PROVIDER_SITE_OTHER): Payer: 59

## 2011-07-29 ENCOUNTER — Other Ambulatory Visit (HOSPITAL_COMMUNITY)
Admission: RE | Admit: 2011-07-29 | Discharge: 2011-07-29 | Disposition: A | Discharge status: Home / Self Care | Payer: 59 | Source: Ambulatory Visit | Attending: Gynecology | Admitting: Gynecology

## 2011-07-29 ENCOUNTER — Telehealth: Payer: Self-pay | Admitting: *Deleted

## 2011-07-29 ENCOUNTER — Ambulatory Visit (INDEPENDENT_AMBULATORY_CARE_PROVIDER_SITE_OTHER): Payer: 59 | Admitting: Gynecology

## 2011-07-29 ENCOUNTER — Encounter: Payer: Self-pay | Admitting: Gynecology

## 2011-07-29 VITALS — BP 124/74 | Ht 64.0 in | Wt 137.0 lb

## 2011-07-29 DIAGNOSIS — B373 Candidiasis of vulva and vagina: Secondary | ICD-10-CM

## 2011-07-29 DIAGNOSIS — B3731 Acute candidiasis of vulva and vagina: Secondary | ICD-10-CM

## 2011-07-29 DIAGNOSIS — Z01419 Encounter for gynecological examination (general) (routine) without abnormal findings: Secondary | ICD-10-CM | POA: Insufficient documentation

## 2011-07-29 DIAGNOSIS — N898 Other specified noninflammatory disorders of vagina: Secondary | ICD-10-CM

## 2011-07-29 DIAGNOSIS — N952 Postmenopausal atrophic vaginitis: Secondary | ICD-10-CM | POA: Insufficient documentation

## 2011-07-29 DIAGNOSIS — J309 Allergic rhinitis, unspecified: Secondary | ICD-10-CM

## 2011-07-29 NOTE — Telephone Encounter (Signed)
Pt was seen today called wanting prescription wrote for her valtrex and vagifem,. Pt would like a hand written prescription mailed to her house rather than Rx sent to the pharmacy so she wont have to make a co pay. She has some medication left over from before. Please advise.

## 2011-07-29 NOTE — Progress Notes (Addendum)
Carrie Cox 1946/09/28 [id number]        65 y.o.  for annual exam.  Doing well without complaints recently had checkup at her primary where they did her blood work.  Past medical history,surgical history, medications, allergies, family history and social history were all reviewed and documented in the EPIC chart. ROS:  Was performed and pertinent positives and negatives are included in the history.  Exam: chaperone present Filed Vitals:   07/29/11 1131  BP: 124/74   General appearance  Normal Skin grossly normal Head/Neck normal with no cervical or supraclavicular adenopathy thyroid normal Lungs  clear Cardiac RR, without RMG Abdominal  soft, nontender, without masses, organomegaly or hernia Breasts  examined lying and sitting without masses, retractions, discharge or axillary adenopathy. Pelvic  Ext/BUS/vagina  normal with atrophic changes noted, white discharge KOH wet prep done  Cervix  normal  Pap done  Uterus  anteverted, normal size, shape and contour, midline and mobile nontender   Adnexa  Without masses or tenderness    Anus and perineum  normal   Rectovaginal  normal sphincter tone without palpated masses or tenderness.    Assessment/Plan:  65 y.o. female for annual exam.    1. White discharge. KOH wet prep is positive for yeast. Patient actually has Diflucan at home and I asked her to take 150 mg dose follow up if symptoms persist or recur. 2. Health maintenance. Self breast exams, the bases discussed of hers. She had her mammogram in June we'll continue with annual mammography. DEXA approximately 2 years ago through her primary and she'll follow up with them reference to this. Colonoscopy was 3 years ago and again she'll follow up with them in reference to this. No blood work was done is done to her primary. I did order a urinalysis is a historically this was not done. Assuming she continues well from a gynecologic standpoint she'll see [place] in a year sooner as  needed. 3. Vagifem/Valtrex. After patient left I failed to discuss the Vagifem and Valtrex refills. I called her on the phone and discussed.  She continues to use Vagifem 3 times a week for vaginal support. She wants to continue. I again discussed with her the risks of absorption, achieving estrogen levels and the issues of stroke heart attack DVT and uterine stimulation. She's comfortable with the risks wants to continue and I wrote her a written refill that she wanted to have mailed to her house of Vagifem 10 mcg #36 one per vagina 3 times a week with 4 refills. She does use Valtrex 500 mg twice a day for 10 days when she has buttocks HSV outbreaks this seems to work for her and I wrote her for Valtrex 500 #60 one by mouth twice a day x10 days when necessary outbreak with 3 refills.    Dara Lords MD, 12:06 PM 07/29/2011

## 2011-07-30 NOTE — Telephone Encounter (Signed)
I called the patient and the prescritions were mailed.

## 2011-08-07 ENCOUNTER — Telehealth: Payer: Self-pay | Admitting: *Deleted

## 2011-08-07 NOTE — Telephone Encounter (Signed)
Pap and u/a results given to pt as requested.

## 2011-08-07 NOTE — Telephone Encounter (Signed)
Pt called wanting pap and u/a results from last office visit.

## 2011-08-08 ENCOUNTER — Ambulatory Visit (INDEPENDENT_AMBULATORY_CARE_PROVIDER_SITE_OTHER): Payer: 59

## 2011-08-08 DIAGNOSIS — J309 Allergic rhinitis, unspecified: Secondary | ICD-10-CM

## 2011-08-21 ENCOUNTER — Ambulatory Visit (INDEPENDENT_AMBULATORY_CARE_PROVIDER_SITE_OTHER): Payer: 59

## 2011-08-21 DIAGNOSIS — J309 Allergic rhinitis, unspecified: Secondary | ICD-10-CM

## 2011-08-30 ENCOUNTER — Ambulatory Visit (INDEPENDENT_AMBULATORY_CARE_PROVIDER_SITE_OTHER): Payer: 59

## 2011-08-30 DIAGNOSIS — J309 Allergic rhinitis, unspecified: Secondary | ICD-10-CM

## 2011-09-17 ENCOUNTER — Other Ambulatory Visit: Payer: Self-pay | Admitting: Gynecology

## 2011-09-17 ENCOUNTER — Ambulatory Visit (INDEPENDENT_AMBULATORY_CARE_PROVIDER_SITE_OTHER): Payer: 59

## 2011-09-17 DIAGNOSIS — J309 Allergic rhinitis, unspecified: Secondary | ICD-10-CM

## 2011-09-30 ENCOUNTER — Ambulatory Visit (INDEPENDENT_AMBULATORY_CARE_PROVIDER_SITE_OTHER): Payer: 59

## 2011-09-30 DIAGNOSIS — J309 Allergic rhinitis, unspecified: Secondary | ICD-10-CM

## 2011-10-16 ENCOUNTER — Encounter: Payer: Self-pay | Admitting: Internal Medicine

## 2011-10-16 ENCOUNTER — Ambulatory Visit (INDEPENDENT_AMBULATORY_CARE_PROVIDER_SITE_OTHER): Payer: 59

## 2011-10-16 ENCOUNTER — Other Ambulatory Visit (INDEPENDENT_AMBULATORY_CARE_PROVIDER_SITE_OTHER): Payer: 59

## 2011-10-16 ENCOUNTER — Ambulatory Visit (INDEPENDENT_AMBULATORY_CARE_PROVIDER_SITE_OTHER): Payer: 59 | Admitting: Internal Medicine

## 2011-10-16 VITALS — BP 138/72 | HR 112 | Ht 64.0 in | Wt 138.0 lb

## 2011-10-16 DIAGNOSIS — J301 Allergic rhinitis due to pollen: Secondary | ICD-10-CM

## 2011-10-16 DIAGNOSIS — H109 Unspecified conjunctivitis: Secondary | ICD-10-CM

## 2011-10-16 DIAGNOSIS — J45909 Unspecified asthma, uncomplicated: Secondary | ICD-10-CM

## 2011-10-16 DIAGNOSIS — J309 Allergic rhinitis, unspecified: Secondary | ICD-10-CM

## 2011-10-16 DIAGNOSIS — H1030 Unspecified acute conjunctivitis, unspecified eye: Secondary | ICD-10-CM

## 2011-10-16 DIAGNOSIS — H1032 Unspecified acute conjunctivitis, left eye: Secondary | ICD-10-CM

## 2011-10-16 LAB — IGE: IgE (Immunoglobulin E), Serum: 13.4 IU/mL (ref 0.0–180.0)

## 2011-10-16 LAB — CBC WITH DIFFERENTIAL/PLATELET
Basophils Relative: 0.3 % (ref 0.0–3.0)
Eosinophils Relative: 2.1 % (ref 0.0–5.0)
Lymphocytes Relative: 35.6 % (ref 12.0–46.0)
MCV: 92.8 fl (ref 78.0–100.0)
Neutrophils Relative %: 52.3 % (ref 43.0–77.0)
RBC: 4.43 Mil/uL (ref 3.87–5.11)
WBC: 5.1 10*3/uL (ref 4.5–10.5)

## 2011-10-16 MED ORDER — AZELASTINE HCL 0.1 % NA SOLN: 1.0000 | Freq: Two times a day (BID) | NASAL | Status: DC

## 2011-10-16 MED ORDER — AZITHROMYCIN 250 MG PO TABS: ORAL_TABLET | ORAL | Status: AC

## 2011-10-16 MED ORDER — MONTELUKAST SODIUM 10 MG PO TABS: 10.0000 mg | ORAL_TABLET | Freq: Every day | ORAL | Status: DC

## 2011-10-16 MED ORDER — ALBUTEROL SULFATE HFA 108 (90 BASE) MCG/ACT IN AERS: 2.0000 | INHALATION_SPRAY | Freq: Four times a day (QID) | RESPIRATORY_TRACT | Status: DC | PRN

## 2011-10-16 MED ORDER — FLUTICASONE PROPIONATE 50 MCG/ACT NA SUSP: 2.0000 | Freq: Every day | NASAL | Status: DC

## 2011-10-16 NOTE — Progress Notes (Signed)
10/16/11- 65 yoF never smoker, followed for allergic rhinitis, mild intermittent asthma. LOV-10/15/10 Has had flu vax. Scratched her left conjunctiva w/ metal handle of a make-up brush in August. Persistent conjuctivitis since. Her opthalmologist is treating w/ Tobra/ dexa drops. Seemed to clear transiently w/ valtrex, but not sustained. Eye itches and tears. Now on keterolac drops.  Much stress, can't sleep, may need xanax.  Rhinitis and wheezing controled w/ flonase, astelin, singulair, recent Zpak and rescue inhaler. Needs refills- discussed. Continues allergy vaccine.   ROS-see HPI Constitutional:   No-   weight loss, night sweats, fevers, chills, fatigue, lassitude. HEENT:   No-  headaches, difficulty swallowing, tooth/dental problems, sore throat,       Controlled sneezing, itching, ear ache, nasal congestion, post nasal drip,  CV:  No-   chest pain, orthopnea, PND, swelling in lower extremities, anasarca, dizziness, palpitations Resp: No-   shortness of breath with exertion or at rest.              No-   productive cough,  No non-productive cough,  No- coughing up of blood.              No-   change in color of mucus.  No- wheezing.   Skin: No-   rash or lesions. GI:  No-   heartburn, indigestion, abdominal pain, nausea, vomiting, diarrhea,                 change in bowel habits, loss of appetite GU:  MS:  No-   joint pain or swelling.  No- decreased range of motion.  No- back pain. Neuro-     nothing unusual Psych:  No- change in mood or affect. No depression or anxiety.  No memory loss.  OBJ General- Alert, Oriented, Affect-appropriate, Distress- none acute Skin- rash-none, lesions- none, excoriation- none Lymphadenopathy- none Head- atraumatic            Eyes- Gross vision intact, PERRLA, Left conjunctiva is red, sparing anterior chamber            Ears- Hearing, canals-normal            Nose- Clear, no-Septal dev, mucus, polyps, erosion, perforation             Throat-  Mallampati II , mucosa clear , drainage- none, tonsils- atrophic Neck- flexible , trachea midline, no stridor , thyroid nl, carotid no bruit Chest - symmetrical excursion , unlabored           Heart/CV- RRR , no murmur , no gallop  , no rub, nl s1 s2                           - JVD- none , edema- none, stasis changes- none, varices- none           Lung- clear to P&A, wheeze- none, cough- none , dullness-none, rub- none           Chest wall-  Abd- tender-no, distended-no, bowel sounds-present, HSM- no Br/ Gen/ Rectal- Not done, not indicated Extrem- cyanosis- none, clubbing, none, atrophy- none, strength- nl Neuro- grossly intact to observation

## 2011-10-16 NOTE — Patient Instructions (Addendum)
Order lab CBC w/ diff, sed rate, ANA, IgE     Dx conjunctivitis  Continue routine treatment  meds refilled  Please call as needed

## 2011-10-17 ENCOUNTER — Telehealth: Payer: Self-pay | Admitting: Internal Medicine

## 2011-10-17 LAB — ANA: Anti Nuclear Antibody(ANA): NEGATIVE

## 2011-10-17 NOTE — Telephone Encounter (Signed)
Called and spoke with pt.  Pt aware of lab results.  

## 2011-10-20 DIAGNOSIS — H1032 Unspecified acute conjunctivitis, left eye: Secondary | ICD-10-CM | POA: Insufficient documentation

## 2011-10-20 NOTE — Assessment & Plan Note (Signed)
Rhinitis has been controlled

## 2011-10-20 NOTE — Assessment & Plan Note (Signed)
Controlled adequately. Meds discussed.

## 2011-10-20 NOTE — Assessment & Plan Note (Signed)
Likely there is some embedded foreign material in the conjunctiva. I can check some labs looking for a systemic inflammatory process, but she needs to have this managed by her eye doctor. I don't think this is related to her allergic rhinitis. Plan- IgE, ANA, Sed rate

## 2011-10-25 ENCOUNTER — Ambulatory Visit (INDEPENDENT_AMBULATORY_CARE_PROVIDER_SITE_OTHER): Payer: 59

## 2011-10-25 DIAGNOSIS — J309 Allergic rhinitis, unspecified: Secondary | ICD-10-CM

## 2011-10-28 ENCOUNTER — Ambulatory Visit (INDEPENDENT_AMBULATORY_CARE_PROVIDER_SITE_OTHER): Payer: 59

## 2011-10-28 DIAGNOSIS — J309 Allergic rhinitis, unspecified: Secondary | ICD-10-CM

## 2011-11-04 ENCOUNTER — Telehealth: Payer: Self-pay | Admitting: Internal Medicine

## 2011-11-04 DIAGNOSIS — Z72820 Sleep deprivation: Secondary | ICD-10-CM

## 2011-11-04 MED ORDER — CLARITHROMYCIN 500 MG PO TABS: ORAL_TABLET | ORAL | Status: AC

## 2011-11-04 NOTE — Telephone Encounter (Signed)
I spoke with pt and is aware of directions for biaxin. She voiced her understanding and rx was sent [facility] [address]

## 2011-11-04 NOTE — Telephone Encounter (Signed)
Per CY-okay to give Biaxin 500 mg #14 take 1 po bid after meal no refills.

## 2011-11-04 NOTE — Telephone Encounter (Signed)
Has completed zpak yesterday. C/o wheezing, productive cough with thick yellow/green mucus, low grade fever, x 6 days worse x 4 days. Has tried Biaxin in the past with relief. Allergies  Allergen Reactions  . Codeine   . Doxycycline     Stomach pain  . Erythromycin    [facility]

## 2011-11-18 ENCOUNTER — Ambulatory Visit (INDEPENDENT_AMBULATORY_CARE_PROVIDER_SITE_OTHER): Payer: 59

## 2011-11-18 DIAGNOSIS — J309 Allergic rhinitis, unspecified: Secondary | ICD-10-CM

## 2011-11-27 DIAGNOSIS — IMO0002 Reserved for concepts with insufficient information to code with codable children: Secondary | ICD-10-CM | POA: Insufficient documentation

## 2011-11-27 DIAGNOSIS — H04229 Epiphora due to insufficient drainage, unspecified lacrimal gland: Secondary | ICD-10-CM | POA: Insufficient documentation

## 2011-12-04 ENCOUNTER — Ambulatory Visit (INDEPENDENT_AMBULATORY_CARE_PROVIDER_SITE_OTHER): Payer: 59

## 2011-12-04 DIAGNOSIS — J309 Allergic rhinitis, unspecified: Secondary | ICD-10-CM

## 2011-12-06 ENCOUNTER — Other Ambulatory Visit: Payer: Self-pay | Admitting: Internal Medicine

## 2011-12-16 ENCOUNTER — Ambulatory Visit (INDEPENDENT_AMBULATORY_CARE_PROVIDER_SITE_OTHER): Payer: 59

## 2011-12-16 DIAGNOSIS — J309 Allergic rhinitis, unspecified: Secondary | ICD-10-CM

## 2011-12-19 ENCOUNTER — Telehealth: Payer: Self-pay | Admitting: Internal Medicine

## 2011-12-19 MED ORDER — OSELTAMIVIR PHOSPHATE 75 MG PO CAPS: 75.0000 mg | ORAL_CAPSULE | Freq: Every day | ORAL | Status: AC

## 2011-12-19 NOTE — Telephone Encounter (Signed)
Tamiflu 75 mg   # 14,   1 daily for prevention.

## 2011-12-19 NOTE — Telephone Encounter (Signed)
Pt aware of RX for Tamiflu and to take once daily for 14 days for prevention. RX [facility] to [facility] [address]

## 2011-12-19 NOTE — Telephone Encounter (Signed)
Spoke with pt. She states that she will be traveling soon, and is going to be around people who have recently had the flu despite having the flu shot. She had her flu shot, but fears getting sick while on her trip and she read where tamiflu can be taken for preventative measures, so now she wants rx called in for this so she can go ahead and start on it before she leaves. Please advise if this is okay, thanks! Allergies  Allergen Reactions  . Codeine   . Doxycycline     Stomach pain  . Erythromycin

## 2011-12-31 ENCOUNTER — Ambulatory Visit (INDEPENDENT_AMBULATORY_CARE_PROVIDER_SITE_OTHER): Payer: 59

## 2011-12-31 DIAGNOSIS — J309 Allergic rhinitis, unspecified: Secondary | ICD-10-CM

## 2012-01-13 ENCOUNTER — Ambulatory Visit (INDEPENDENT_AMBULATORY_CARE_PROVIDER_SITE_OTHER): Payer: 59

## 2012-01-13 DIAGNOSIS — J309 Allergic rhinitis, unspecified: Secondary | ICD-10-CM

## 2012-01-14 ENCOUNTER — Encounter: Payer: Self-pay | Admitting: Internal Medicine

## 2012-01-23 ENCOUNTER — Ambulatory Visit (INDEPENDENT_AMBULATORY_CARE_PROVIDER_SITE_OTHER): Payer: 59

## 2012-01-23 DIAGNOSIS — J309 Allergic rhinitis, unspecified: Secondary | ICD-10-CM

## 2012-02-03 ENCOUNTER — Ambulatory Visit (INDEPENDENT_AMBULATORY_CARE_PROVIDER_SITE_OTHER): Payer: 59 | Admitting: Internal Medicine

## 2012-02-03 ENCOUNTER — Telehealth: Payer: Self-pay | Admitting: Internal Medicine

## 2012-02-03 ENCOUNTER — Encounter: Payer: Self-pay | Admitting: Internal Medicine

## 2012-02-03 VITALS — BP 150/62 | HR 112 | Temp 98.7°F | Ht 64.0 in | Wt 139.2 lb

## 2012-02-03 DIAGNOSIS — J45909 Unspecified asthma, uncomplicated: Secondary | ICD-10-CM

## 2012-02-03 DIAGNOSIS — J069 Acute upper respiratory infection, unspecified: Secondary | ICD-10-CM

## 2012-02-03 MED ORDER — AMOXICILLIN-POT CLAVULANATE 875-125 MG PO TABS: 1.0000 | ORAL_TABLET | Freq: Two times a day (BID) | ORAL | Status: AC

## 2012-02-03 MED ORDER — METHYLPREDNISOLONE ACETATE 80 MG/ML IJ SUSP
120.0000 mg | Freq: Once | INTRAMUSCULAR | Status: AC
  Administered 2012-02-03: 120 mg via INTRAMUSCULAR

## 2012-02-03 NOTE — Telephone Encounter (Signed)
I spoke with the pt and she states Dr. Maple Hudson had given her a zpak to have on hold if she gets sick. She states she took this last week and her symptoms have not improved at all. She is c/o sore throat. Ear pain, productive cough with green phlegm and overall feels bad. She requests an appt. Appt set today at 10:15 with Dr. Sherene Sires. Carron Curie, CMA

## 2012-02-03 NOTE — Patient Instructions (Addendum)
Mucinex dm 1200mg  every 12 hours as needed for cough  Depomedrol 120 mg injection today and hold asthmanex until the cough is gone  Augmentin 875 mg twice daily x 10 days  Try prilosec 20mg   Take 30-60 min before first meal of the day and Pepcid 20 mg one bedtime until cough is completely gone for at least a week without the need for cough suppression  I think of reflux for chronic cough like I do oxygen for fire (doesn't cause the fire but once you get the oxygen suppressed it usually goes away regardless of the exact cause).   GERD (REFLUX)  is an extremely common cause of respiratory symptoms, many times with no significant heartburn at all.    It can be treated with medication, but also with lifestyle changes including avoidance of late meals, excessive alcohol, smoking cessation, and avoid fatty foods, chocolate, peppermint, colas, red wine, and acidic juices such as orange juice.  NO MINT OR MENTHOL PRODUCTS SO NO COUGH DROPS  USE SUGARLESS CANDY INSTEAD (jolley ranchers or Stover's)  NO OIL BASED VITAMINS - use powdered substitutes.  Call us if not improving by end of week

## 2012-02-03 NOTE — Progress Notes (Signed)
Subjective:     Patient ID: Carrie Cox, female   DOB: 05-Nov-1945, 66 y.o.   MRN: [id number]  HPI  67 yobm never smoker followed by Dr Maple Hudson on asthmanex for chronic rhinitis and asthma on allergy vaccines  4/8/2013acute w/in ov/Draper Gallon cc Pt prod cough with minimal amount of green to yellow sputum x 1 wk. Also has had increased SOB with exertion and wheeze for 1 wk.  Denies problems with chronic or recurrent cough. Can't take any codeine derivatives, can't take pred but ok with shot. Min improvement p saba   In general Sleeping ok without nocturnal  or early am exacerbation  of respiratory  c/o's or need for noct saba. Also denies any obvious fluctuation of symptoms with weather or environmental changes or other aggravating or alleviating factors except as outlined above   ROS  At present neg for  any significant sore throat, dysphagia, dental problems, itching, sneezing,  nasal congestion or excess/ purulent secretions, ear ache,   fever, chills, sweats, unintended wt loss, pleuritic or exertional cp, hemoptysis, palpitations, orthopnea pnd or leg swelling.  Also denies presyncope, palpitations, heartburn, abdominal pain, anorexia, nausea, vomiting, diarrhea  or change in bowel or urinary habits, change in stools or urine, dysuria,hematuria,  rash, arthralgias, visual complaints, headache, numbness weakness or ataxia or problems with walking or coordination. No noted change in mood/affect or memory.                   Review of Systems     Objective:   Physical Exam    amb wf with classic pseudowheeze and severe tracheal cough No increased wob HEENT: nl dentition, turbinates, and orophanx. Nl external ear canals without cough reflex   NECK :  without JVD/Nodes/TM/ nl carotid upstrokes bilaterally   LUNGS: no acc muscle use, clear to A and P bilaterally without cough on insp or exp maneuvers   CV:  RRR  no s3 or murmur or increase in P2, no edema   ABD:  soft and nontender  with nl excursion in the supine position. No bruits or organomegaly, bowel sounds nl  MS:  warm without deformities, calf tenderness, cyanosis or clubbing  SKIN: warm and dry without lesions    NEURO:  alert, approp, no deficits  . Assessment:          Plan:

## 2012-02-03 NOTE — Progress Notes (Deleted)
10/16/11- 65 yoF never smoker, followed for allergic rhinitis, mild intermittent asthma. LOV-10/15/10 Has had flu vax. Scratched her left conjunctiva w/ metal handle of a make-up brush in August. Persistent conjuctivitis since. Her opthalmologist is treating w/ Tobra/ dexa drops. Seemed to clear transiently w/ valtrex, but not sustained. Eye itches and tears. Now on keterolac drops.  Much stress, can't sleep, may need xanax.  Rhinitis and wheezing controled w/ flonase, astelin, singulair, recent Zpak and rescue inhaler. Needs refills- discussed. Continues allergy vaccine.   ROS-see HPI Constitutional:   No-   weight loss, night sweats, fevers, chills, fatigue, lassitude. HEENT:   No-  headaches, difficulty swallowing, tooth/dental problems, sore throat,       Controlled sneezing, itching, ear ache, nasal congestion, post nasal drip,  CV:  No-   chest pain, orthopnea, PND, swelling in lower extremities, anasarca, dizziness, palpitations Resp: No-   shortness of breath with exertion or at rest.              No-   productive cough,  No non-productive cough,  No- coughing up of blood.              No-   change in color of mucus.  No- wheezing.   Skin: No-   rash or lesions. GI:  No-   heartburn, indigestion, abdominal pain, nausea, vomiting, diarrhea,                 change in bowel habits, loss of appetite GU:  MS:  No-   joint pain or swelling.  No- decreased range of motion.  No- back pain. Neuro-     nothing unusual Psych:  No- change in mood or affect. No depression or anxiety.  No memory loss.  OBJ General- Alert, Oriented, Affect-appropriate, Distress- none acute Skin- rash-none, lesions- none, excoriation- none Lymphadenopathy- none Head- atraumatic            Eyes- Gross vision intact, PERRLA, Left conjunctiva is red, sparing anterior chamber            Ears- Hearing, canals-normal            Nose- Clear, no-Septal dev, mucus, polyps, erosion, perforation             Throat-  Mallampati II , mucosa clear , drainage- none, tonsils- atrophic Neck- flexible , trachea midline, no stridor , thyroid nl, carotid no bruit Chest - symmetrical excursion , unlabored           Heart/CV- RRR , no murmur , no gallop  , no rub, nl s1 s2                           - JVD- none , edema- none, stasis changes- none, varices- none           Lung- clear to P&A, wheeze- none, cough- none , dullness-none, rub- none           Chest wall-  Abd- tender-no, distended-no, bowel sounds-present, HSM- no Br/ Gen/ Rectal- Not done, not indicated Extrem- cyanosis- none, clubbing, none, atrophy- none, strength- nl Neuro- grossly intact to observation       

## 2012-02-06 DIAGNOSIS — J069 Acute upper respiratory infection, unspecified: Secondary | ICD-10-CM | POA: Insufficient documentation

## 2012-02-06 NOTE — Assessment & Plan Note (Signed)
No wheezing at all on exam today  And  Symptoms are markedly disproportionate to objective findings and not clear this is a lung problem but pt does appear to have difficult airway management issues acutely - will ask her to hold the asthmanex until cough better then resume.

## 2012-02-06 NOTE — Assessment & Plan Note (Signed)
Apparent viral uri  Explained natural history of uri and why it's necessary in patients at risk to treat GERD empirically  at least  short term   to reduce risk of evolving cyclical cough initially  triggered by epithelial injury and a heightened sensitivty to the effects of any upper airway irritants,  most importantly acid - related.  That is, the more sensitive the epithelium damaged for virus, the more the cough, the more the secondary reflux (especially in those prone to reflux) the more the irritation of the sensitive mucosa and so on in a cyclical pattern.   See instructions for specific recommendations which were reviewed directly with the patient who was given a copy with highlighter outlining the key components.

## 2012-02-07 ENCOUNTER — Ambulatory Visit (INDEPENDENT_AMBULATORY_CARE_PROVIDER_SITE_OTHER): Payer: 59

## 2012-02-07 DIAGNOSIS — J309 Allergic rhinitis, unspecified: Secondary | ICD-10-CM

## 2012-02-17 DIAGNOSIS — IMO0002 Reserved for concepts with insufficient information to code with codable children: Secondary | ICD-10-CM | POA: Insufficient documentation

## 2012-02-28 ENCOUNTER — Ambulatory Visit (INDEPENDENT_AMBULATORY_CARE_PROVIDER_SITE_OTHER): Payer: 59

## 2012-02-28 DIAGNOSIS — J309 Allergic rhinitis, unspecified: Secondary | ICD-10-CM

## 2012-03-13 ENCOUNTER — Ambulatory Visit (INDEPENDENT_AMBULATORY_CARE_PROVIDER_SITE_OTHER): Payer: 59

## 2012-03-13 DIAGNOSIS — J309 Allergic rhinitis, unspecified: Secondary | ICD-10-CM

## 2012-03-26 ENCOUNTER — Ambulatory Visit (INDEPENDENT_AMBULATORY_CARE_PROVIDER_SITE_OTHER): Payer: 59

## 2012-03-26 DIAGNOSIS — J309 Allergic rhinitis, unspecified: Secondary | ICD-10-CM

## 2012-04-13 ENCOUNTER — Encounter: Payer: Self-pay | Admitting: Gynecology

## 2012-04-17 ENCOUNTER — Ambulatory Visit (INDEPENDENT_AMBULATORY_CARE_PROVIDER_SITE_OTHER): Payer: 59

## 2012-04-17 DIAGNOSIS — J309 Allergic rhinitis, unspecified: Secondary | ICD-10-CM

## 2012-05-05 ENCOUNTER — Ambulatory Visit (INDEPENDENT_AMBULATORY_CARE_PROVIDER_SITE_OTHER): Payer: 59

## 2012-05-05 DIAGNOSIS — J309 Allergic rhinitis, unspecified: Secondary | ICD-10-CM

## 2012-05-20 ENCOUNTER — Ambulatory Visit (INDEPENDENT_AMBULATORY_CARE_PROVIDER_SITE_OTHER): Payer: 59

## 2012-05-20 DIAGNOSIS — J309 Allergic rhinitis, unspecified: Secondary | ICD-10-CM

## 2012-06-03 ENCOUNTER — Ambulatory Visit (INDEPENDENT_AMBULATORY_CARE_PROVIDER_SITE_OTHER): Payer: 59

## 2012-06-03 DIAGNOSIS — J309 Allergic rhinitis, unspecified: Secondary | ICD-10-CM

## 2012-06-30 ENCOUNTER — Ambulatory Visit (INDEPENDENT_AMBULATORY_CARE_PROVIDER_SITE_OTHER): Payer: 59

## 2012-06-30 DIAGNOSIS — J309 Allergic rhinitis, unspecified: Secondary | ICD-10-CM

## 2012-07-03 ENCOUNTER — Telehealth: Payer: Self-pay | Admitting: Internal Medicine

## 2012-07-03 MED ORDER — MONTELUKAST SODIUM 10 MG PO TABS: 10.0000 mg | ORAL_TABLET | Freq: Every day | ORAL | Status: DC

## 2012-07-03 NOTE — Telephone Encounter (Signed)
i spoke with pt and is [facility] i have done so. Nothing further was needed

## 2012-07-20 ENCOUNTER — Telehealth: Payer: Self-pay | Admitting: Internal Medicine

## 2012-07-20 ENCOUNTER — Encounter: Payer: Self-pay | Admitting: Internal Medicine

## 2012-07-20 ENCOUNTER — Ambulatory Visit (INDEPENDENT_AMBULATORY_CARE_PROVIDER_SITE_OTHER): Payer: 59 | Admitting: Internal Medicine

## 2012-07-20 VITALS — BP 132/78 | HR 88 | Ht 64.0 in | Wt 139.0 lb

## 2012-07-20 DIAGNOSIS — J069 Acute upper respiratory infection, unspecified: Secondary | ICD-10-CM

## 2012-07-20 DIAGNOSIS — J329 Chronic sinusitis, unspecified: Secondary | ICD-10-CM

## 2012-07-20 MED ORDER — AMOXICILLIN 500 MG PO CAPS: 500.0000 mg | ORAL_CAPSULE | Freq: Three times a day (TID) | ORAL | Status: DC

## 2012-07-20 MED ORDER — METHYLPREDNISOLONE ACETATE 80 MG/ML IJ SUSP
80.0000 mg | Freq: Once | INTRAMUSCULAR | Status: AC
  Administered 2012-07-20: 80 mg via INTRAMUSCULAR

## 2012-07-20 MED ORDER — PHENYLEPHRINE HCL 1 % NA SOLN
3.0000 [drp] | Freq: Once | NASAL | Status: AC
  Administered 2012-07-20: 3 [drp] via NASAL

## 2012-07-20 MED ORDER — PREDNISONE 10 MG PO TABS: ORAL_TABLET | ORAL | Status: DC

## 2012-07-20 NOTE — Telephone Encounter (Signed)
Returning call can be reached at [phone].Carrie Cox

## 2012-07-20 NOTE — Telephone Encounter (Signed)
LMOMTCB x 1 

## 2012-07-20 NOTE — Progress Notes (Signed)
Subjective:     Patient ID: Carrie Cox, female   DOB: [birth year], 66 y.o.   MRN: [id number]  HPI  F never smoker followed by Dr Maple Hudson on asthmanex for chronic rhinitis and asthma on allergy vaccines  02/03/2012-acute w/in ov/Wert cc Pt prod cough with minimal amount of green to yellow sputum x 1 wk. Also has had increased SOB with exertion and wheeze for 1 wk.  Denies problems with chronic or recurrent cough. Can't take any codeine derivatives, can't take pred but ok with shot. Min improvement p saba  In general Sleeping ok without nocturnal  or early am exacerbation  of respiratory  c/o's or need for noct saba. Also denies any obvious fluctuation of symptoms with weather or environmental changes or other aggravating or alleviating factors except as outlined above   07/20/12- 66 yoF never smoker followed for allergic rhinitis, asthma/ allergy vaccine ACUTE VISIT: unable to sleep due to cough, congestion, sinus pressure, Right ear pain; Finished Zpak yesterday and no better. Nonproductive cough lying down. Chest feels clear but SOB so hasn't tried rescue inhaler.. No sore throat or fever.  ROS-see HPI Constitutional:   No-   weight loss, night sweats, fevers, chills, fatigue, lassitude. HEENT:   No-  headaches, difficulty swallowing, tooth/dental problems, sore throat,       No-  sneezing, itching, ear ache, +nasal congestion, post nasal drip,  CV:  No-   chest pain, orthopnea, PND, swelling in lower extremities, anasarca, dizziness, palpitations Resp: + shortness of breath with exertion or at rest.              No-   productive cough,  + non-productive cough,  No- coughing up of blood.              No-   change in color of mucus.  No- wheezing.   Skin: No-   rash or lesions. GI:  No-   heartburn, indigestion, abdominal pain, nausea, vomiting,  GU: . MS:  No-   joint pain or swelling.   Neuro-     nothing unusual Psych:  No- change in mood or affect. No depression or anxiety.  No memory  loss.  OBJ- Physical Exam General- Alert, Oriented, Affect-appropriate, Distress- none acute. Wearing dust mask.  Skin- rash-none, lesions- none, excoriation- none Lymphadenopathy- none Head- atraumatic            Eyes- Gross vision intact, PERRLA, conjunctivae and secretions clear            Ears- +R canal red, also cerumen            Nose- Clear, no-Septal dev, mucus, polyps, erosion, perforation             Throat- Mallampati II , +mucosa red, +tongue coated , drainage- none, tonsils- atrophic Neck- flexible , trachea midline, no stridor , thyroid nl, carotid no bruit Chest - symmetrical excursion , unlabored           Heart/CV- RRR , no murmur , no gallop  , no rub, nl s1 s2                           - JVD- none , edema- none, stasis changes- none, varices- none           Lung- clear to P&A, wheeze- none, +barking cough , dullness-none, rub- none           Chest wall-  Abd-  Br/ Gen/ Rectal- Not done, not indicated Extrem- cyanosis- none, clubbing, none, atrophy- none, strength- nl Neuro- grossly intact to observation                          Assessment:          Plan:

## 2012-07-20 NOTE — Telephone Encounter (Signed)
Pt worked in on [employer] schedule today and will be here by 1130am.

## 2012-07-20 NOTE — Patient Instructions (Addendum)
Script sent for amoxacillin  Script printed for prednisone taper to hold  Depo 80  Neb neo nasal  Extra fluids   Flu vaccine-  May return after acute illness improves

## 2012-07-20 NOTE — Telephone Encounter (Signed)
Pt c/o cough,congestion, sob and occasional wheezing since last week. She did finsh a Zpak yesterday an dsays her mucus in now yellow. She feels very tight in her chest and wants to come in for a Depo shot if possible and be seen. Pls advise if the pt can be seen today or give recs. Pt was last seen in April 2013. amd has a pending appr for Dec 2013. {ls advise. Allergies  Allergen Reactions  . Codeine   . Doxycycline     Stomach pain  . Erythromycin

## 2012-07-27 NOTE — Assessment & Plan Note (Signed)
URI with tracheobronchitis.  Plan- neb neo nasal, depomedrol, amoxacillin.

## 2012-07-28 ENCOUNTER — Telehealth: Payer: Self-pay | Admitting: Internal Medicine

## 2012-07-28 NOTE — Telephone Encounter (Signed)
Ok to get the flu vax offered by her job.

## 2012-07-28 NOTE — Telephone Encounter (Signed)
I spoke with pt and is aware we do not have high dose flu vaccine. She stated her job is offering the flu shots on Friday and they may get the high dose flu vaccine. She is wanting to know if Dr. Maple Hudson thinks it is okay for high dose vaccine for her. Also she stated she is on her last day of prednisone bc she was sick. She is feeling much better no is having no fever. Is it okay to get the flu shot or should she wait. Pt stated to give her a call back tomorrow. Please advise Dr. Maple Hudson thanks

## 2012-07-29 NOTE — Telephone Encounter (Signed)
Called pt at # provided above - [email or web address] 

## 2012-07-29 NOTE — Telephone Encounter (Signed)
Pt returned call. She is aware that she may get the shot offered by her job. "Thanks" and nothing further is needed at this time per pt. Carrie Cox

## 2012-08-05 ENCOUNTER — Encounter: Payer: Self-pay | Admitting: Gynecology

## 2012-08-05 ENCOUNTER — Ambulatory Visit (INDEPENDENT_AMBULATORY_CARE_PROVIDER_SITE_OTHER): Payer: 59

## 2012-08-05 ENCOUNTER — Ambulatory Visit (INDEPENDENT_AMBULATORY_CARE_PROVIDER_SITE_OTHER): Payer: 59 | Admitting: Gynecology

## 2012-08-05 VITALS — BP 132/88 | Ht 64.0 in | Wt 137.0 lb

## 2012-08-05 DIAGNOSIS — N8111 Cystocele, midline: Secondary | ICD-10-CM

## 2012-08-05 DIAGNOSIS — Z780 Asymptomatic menopausal state: Secondary | ICD-10-CM

## 2012-08-05 DIAGNOSIS — Z01419 Encounter for gynecological examination (general) (routine) without abnormal findings: Secondary | ICD-10-CM

## 2012-08-05 DIAGNOSIS — J309 Allergic rhinitis, unspecified: Secondary | ICD-10-CM

## 2012-08-05 DIAGNOSIS — N952 Postmenopausal atrophic vaginitis: Secondary | ICD-10-CM

## 2012-08-05 MED ORDER — ESTRADIOL 10 MCG VA TABS: 10.0000 ug | ORAL_TABLET | VAGINAL | Status: DC

## 2012-08-05 MED ORDER — VALACYCLOVIR HCL 500 MG PO TABS: 500.0000 mg | ORAL_TABLET | Freq: Two times a day (BID) | ORAL | Status: DC

## 2012-08-05 MED ORDER — FLUCONAZOLE 150 MG PO TABS: 150.0000 mg | ORAL_TABLET | Freq: Once | ORAL | Status: DC

## 2012-08-05 NOTE — Patient Instructions (Signed)
follow up for bone density as scheduled. 

## 2012-08-05 NOTE — Progress Notes (Signed)
Carrie Cox Dec 19, 1945 [id number]        66 y.o.  G1P1001 for annual exam.  Doing well without complaints.  Past medical history,surgical history, medications, allergies, family history and social history were all reviewed and documented in the EPIC chart. ROS:  Was performed and pertinent positives and negatives are included in the history.  Exam: Carrie Cox assistant Filed Vitals:   08/05/12 0858  BP: 132/88  Height: 5\' 4"  (1.626 m)  Weight: 137 lb (62.143 kg)   General appearance  Normal Skin grossly normal Head/Neck normal with no cervical or supraclavicular adenopathy thyroid normal Lungs  clear Cardiac RR, without RMG Abdominal  soft, nontender, without masses, organomegaly or hernia Breasts  examined lying and sitting without masses, retractions, discharge or axillary adenopathy. Pelvic  Ext/BUS/vagina  normal with atrophic genital changes, mild cystocele  Cervix  normal   Uterus  axial, normal size, shape and contour, midline and mobile nontender   Adnexa  Without masses or tenderness    Anus and perineum  normal   Rectovaginal  normal sphincter tone without palpated masses or tenderness.    Assessment/Plan:  66 y.o. G73P1001 female for annual exam.   1. Atrophic vaginal changes. Patient used Vagifem 10 mcg 3 times weekly doing well wants to continue. We again discussed the absorption issue and risks of estrogen to include stroke heart attack DVT, breast cancer and endometrial stimulation. She's done no bleeding and knows to report this.  Vagifem 10 mcg 3 times weekly refill x1 year. 2. Buttocks HSV outbreaks. Patient uses Valtrex 500 mg twice a day x10 days for outbreaks. I refilled her times a year. 3. Recurrent yeast infections. Patient sensitive with yeast outbreaks after antibiotic treatments. I gave her a prescription for Diflucan 150 mg #5 with one refill to use when necessary. 4. Mild cystocele. Asymptomatic. We'll continue to monitor. 5. Mammography. Patient up to  date and will continue with annual mammography. SBE monthly reviewed. 6. Pap smear. No Pap smear done today. Last Pap smear 2012. No history of abnormal Pap smears before. Options to stop altogether if she is 66 versus less frequent screening reviewed and we will readdress this on an annual basis. 7. DEXA. Patient had bone density approximately 5 years ago for an office heel check. We'll schedule DEXA now. Increase calcium vitamin D reviewed. 8. Colonoscopy. Patient is up-to-date with screening. 9. Health maintenance. Mild elevated blood pressure noted. No history of this before. Patient will have repeated at her primary physician's office at her visit this month.  No blood work done today as it's all done through her primary physician's office and she has an appointment to see her this coming month. Follow up for DEXA, otherwise in one year.    Carrie Lords MD, 10:02 AM 08/05/2012

## 2012-08-06 LAB — URINALYSIS W MICROSCOPIC + REFLEX CULTURE
Glucose, UA: NEGATIVE mg/dL
Hgb urine dipstick: NEGATIVE
Leukocytes, UA: NEGATIVE
Nitrite: NEGATIVE
Protein, ur: NEGATIVE mg/dL

## 2012-08-26 ENCOUNTER — Ambulatory Visit (INDEPENDENT_AMBULATORY_CARE_PROVIDER_SITE_OTHER): Payer: 59

## 2012-08-26 DIAGNOSIS — J309 Allergic rhinitis, unspecified: Secondary | ICD-10-CM

## 2012-09-03 ENCOUNTER — Ambulatory Visit (INDEPENDENT_AMBULATORY_CARE_PROVIDER_SITE_OTHER): Payer: 59

## 2012-09-03 DIAGNOSIS — M8580 Other specified disorders of bone density and structure, unspecified site: Secondary | ICD-10-CM

## 2012-09-03 DIAGNOSIS — Z780 Asymptomatic menopausal state: Secondary | ICD-10-CM

## 2012-09-03 DIAGNOSIS — M899 Disorder of bone, unspecified: Secondary | ICD-10-CM

## 2012-09-04 ENCOUNTER — Encounter: Payer: Self-pay | Admitting: Gynecology

## 2012-09-04 ENCOUNTER — Telehealth: Payer: Self-pay | Admitting: Gynecology

## 2012-09-04 NOTE — Telephone Encounter (Signed)
Pt informed with the below note. 

## 2012-09-04 NOTE — Telephone Encounter (Signed)
Left message for pt to call.

## 2012-09-04 NOTE — Telephone Encounter (Signed)
Tell patient that her bone density does show some mild bone loss. Recommend calcium 500 mg a day supplement and vitamin D 1000 units daily. Increase weight-bearing exercise such as walking and we'll recheck the bone density in 2 years

## 2012-09-10 ENCOUNTER — Ambulatory Visit (INDEPENDENT_AMBULATORY_CARE_PROVIDER_SITE_OTHER): Payer: 59

## 2012-09-10 DIAGNOSIS — J309 Allergic rhinitis, unspecified: Secondary | ICD-10-CM

## 2012-09-22 ENCOUNTER — Ambulatory Visit (INDEPENDENT_AMBULATORY_CARE_PROVIDER_SITE_OTHER): Payer: 59

## 2012-09-22 DIAGNOSIS — J309 Allergic rhinitis, unspecified: Secondary | ICD-10-CM

## 2012-09-25 ENCOUNTER — Encounter: Payer: Self-pay | Admitting: Internal Medicine

## 2012-10-14 ENCOUNTER — Ambulatory Visit: Payer: 59 | Admitting: Internal Medicine

## 2012-10-16 ENCOUNTER — Ambulatory Visit (INDEPENDENT_AMBULATORY_CARE_PROVIDER_SITE_OTHER): Payer: 59 | Admitting: Internal Medicine

## 2012-10-16 ENCOUNTER — Ambulatory Visit (INDEPENDENT_AMBULATORY_CARE_PROVIDER_SITE_OTHER): Payer: 59

## 2012-10-16 ENCOUNTER — Encounter: Payer: Self-pay | Admitting: Internal Medicine

## 2012-10-16 VITALS — BP 132/90 | HR 84 | Ht 64.0 in | Wt 142.0 lb

## 2012-10-16 DIAGNOSIS — J301 Allergic rhinitis due to pollen: Secondary | ICD-10-CM

## 2012-10-16 DIAGNOSIS — J45909 Unspecified asthma, uncomplicated: Secondary | ICD-10-CM

## 2012-10-16 DIAGNOSIS — J309 Allergic rhinitis, unspecified: Secondary | ICD-10-CM

## 2012-10-16 DIAGNOSIS — Z889 Allergy status to unspecified drugs, medicaments and biological substances status: Secondary | ICD-10-CM

## 2012-10-16 DIAGNOSIS — Z9109 Other allergy status, other than to drugs and biological substances: Secondary | ICD-10-CM

## 2012-10-16 DIAGNOSIS — J45998 Other asthma: Secondary | ICD-10-CM

## 2012-10-16 MED ORDER — ALBUTEROL SULFATE HFA 108 (90 BASE) MCG/ACT IN AERS: 2.0000 | INHALATION_SPRAY | Freq: Four times a day (QID) | RESPIRATORY_TRACT | Status: DC | PRN

## 2012-10-16 MED ORDER — MONTELUKAST SODIUM 10 MG PO TABS: 10.0000 mg | ORAL_TABLET | Freq: Every day | ORAL | Status: DC

## 2012-10-16 MED ORDER — AZITHROMYCIN 250 MG PO TABS: ORAL_TABLET | ORAL | Status: DC

## 2012-10-16 MED ORDER — EPINEPHRINE 0.3 MG/0.3ML IJ DEVI: 0.3000 mg | Freq: Once | INTRAMUSCULAR | Status: DC

## 2012-10-16 MED ORDER — FLUTICASONE PROPIONATE 50 MCG/ACT NA SUSP: 2.0000 | Freq: Every day | NASAL | Status: DC

## 2012-10-16 MED ORDER — HALOBETASOL PROPIONATE 0.05 % EX CREA: TOPICAL_CREAM | Freq: Two times a day (BID) | CUTANEOUS | Status: DC

## 2012-10-16 MED ORDER — AZELASTINE HCL 0.1 % NA SOLN: 1.0000 | Freq: Two times a day (BID) | NASAL | Status: DC

## 2012-10-16 NOTE — Progress Notes (Signed)
Subjective:     Patient ID: Carrie Cox, female   DOB: 20-Apr-1946, 66 y.o.   MRN: [id number]  HPI  F never smoker followed by Dr Maple Hudson on asthmanex for chronic rhinitis and asthma on allergy vaccines  02/03/2012-acute w/in ov/Wert cc Pt prod cough with minimal amount of green to yellow sputum x 1 wk. Also has had increased SOB with exertion and wheeze for 1 wk.  Denies problems with chronic or recurrent cough. Can't take any codeine derivatives, can't take pred but ok with shot. Min improvement p saba  In general Sleeping ok without nocturnal  or early am exacerbation  of respiratory  c/o's or need for noct saba. Also denies any obvious fluctuation of symptoms with weather or environmental changes or other aggravating or alleviating factors except as outlined above   07/20/12- 66 yoF never smoker followed for allergic rhinitis, asthma/ allergy vaccine ACUTE VISIT: unable to sleep due to cough, congestion, sinus pressure, Right ear pain; Finished Zpak yesterday and no better. Nonproductive cough lying down. Chest feels clear but SOB so hasn't tried rescue inhaler.. No sore throat or fever.  10/16/12- 66 yoF never smoker followed for allergic rhinitis, asthma/ allergy vaccine FOLLOWS FOR: doing good and still on allergy vaccine 1:10 GH, adding dog antigen gradually. . Some daily cough and awareness of postnasal drip but overall says she is "great". Had an upper respiratory infection in September but that resolved.  ROS-see HPI Constitutional:   No-   weight loss, night sweats, fevers, chills, fatigue, lassitude. HEENT:   No-  headaches, difficulty swallowing, tooth/dental problems, sore throat,       No-  sneezing, itching, ear ache, +nasal congestion, post nasal drip,  CV:  No-   chest pain, orthopnea, PND, swelling in lower extremities, anasarca, dizziness, palpitations Resp: + shortness of breath with exertion or at rest.              No-   productive cough,  + non-productive cough,  No-  coughing up of blood.              No-   change in color of mucus.  No- wheezing.   Skin: No-   rash or lesions. GI:  No-   heartburn, indigestion, abdominal pain, nausea, vomiting,  GU: . MS:  No-   joint pain or swelling.   Neuro-     nothing unusual Psych:  No- change in mood or affect. No depression or anxiety.  No memory loss.  OBJ- Physical Exam General- Alert, Oriented, Affect-appropriate, Distress- none acute. Wearing dust mask.  Skin- rash-none, lesions- none, excoriation- none Lymphadenopathy- none Head- atraumatic            Eyes- Gross vision intact, PERRLA, conjunctivae and secretions clear            Ears- +R canal red, also cerumen            Nose- Clear, no-Septal dev, mucus, polyps, erosion, perforation             Throat- Mallampati II , mucosa-clear , drainage- none, tonsils- atrophic Neck- flexible , trachea midline, no stridor , thyroid nl, carotid no bruit Chest - symmetrical excursion , unlabored           Heart/CV- RRR , no murmur , no gallop  , no rub, nl s1 s2                           -  JVD- none , edema- none, stasis changes- none, varices- none           Lung- clear to P&A, wheeze- none, no- cough , dullness-none, rub- none           Chest wall-  Abd-  Br/ Gen/ Rectal- Not done, not indicated Extrem- cyanosis- none, clubbing, none, atrophy- none, strength- nl Neuro- grossly intact to observation  Assessment:          Plan:

## 2012-10-28 NOTE — Patient Instructions (Signed)
We can continue allergy vaccine 1:10

## 2012-10-28 NOTE — Assessment & Plan Note (Signed)
We have discussed goals, risks, expectations of allergy vaccine. She wishes to continue.

## 2012-10-28 NOTE — Assessment & Plan Note (Signed)
Good control through viral infection this fall

## 2012-11-11 ENCOUNTER — Ambulatory Visit (INDEPENDENT_AMBULATORY_CARE_PROVIDER_SITE_OTHER): Payer: 59

## 2012-11-11 DIAGNOSIS — J309 Allergic rhinitis, unspecified: Secondary | ICD-10-CM

## 2012-11-16 ENCOUNTER — Ambulatory Visit (INDEPENDENT_AMBULATORY_CARE_PROVIDER_SITE_OTHER): Payer: 59

## 2012-11-16 DIAGNOSIS — J309 Allergic rhinitis, unspecified: Secondary | ICD-10-CM

## 2012-11-18 ENCOUNTER — Ambulatory Visit: Payer: 59

## 2012-11-25 ENCOUNTER — Ambulatory Visit (INDEPENDENT_AMBULATORY_CARE_PROVIDER_SITE_OTHER): Payer: 59

## 2012-11-25 DIAGNOSIS — J309 Allergic rhinitis, unspecified: Secondary | ICD-10-CM

## 2012-12-02 ENCOUNTER — Ambulatory Visit: Payer: 59

## 2012-12-17 ENCOUNTER — Ambulatory Visit (INDEPENDENT_AMBULATORY_CARE_PROVIDER_SITE_OTHER): Payer: 59

## 2012-12-23 ENCOUNTER — Ambulatory Visit: Payer: 59

## 2012-12-31 ENCOUNTER — Ambulatory Visit (INDEPENDENT_AMBULATORY_CARE_PROVIDER_SITE_OTHER): Payer: 59

## 2012-12-31 ENCOUNTER — Telehealth: Payer: Self-pay | Admitting: Internal Medicine

## 2012-12-31 DIAGNOSIS — J309 Allergic rhinitis, unspecified: Secondary | ICD-10-CM

## 2012-12-31 MED ORDER — FLUTICASONE PROPIONATE 50 MCG/ACT NA SUSP: 2.0000 | Freq: Every day | NASAL | Status: DC

## 2012-12-31 NOTE — Telephone Encounter (Signed)
Per CDY: We have no samples to give but we can give RX if needed. Will send to [facility] as pt is coming in to get allergy shot and she can document once pt is told this per Florentina Addison. I called and made Selena Batten T aware of this

## 2012-12-31 NOTE — Telephone Encounter (Signed)
Advised pt we didn't have any samples and she requested we send a 30 day supply  to her local Pharmacy.  flonase  nasal spray <> 2 sprays daily. #1. Pt aware nothing further needed.

## 2013-01-06 ENCOUNTER — Ambulatory Visit: Payer: 59

## 2013-01-08 ENCOUNTER — Other Ambulatory Visit: Payer: Self-pay | Admitting: Internal Medicine

## 2013-01-08 MED ORDER — FLUTICASONE PROPIONATE 50 MCG/ACT NA SUSP: 2.0000 | Freq: Every day | NASAL | Status: DC

## 2013-01-08 MED ORDER — AZELASTINE HCL 0.1 % NA SOLN: 1.0000 | Freq: Two times a day (BID) | NASAL | Status: DC

## 2013-01-19 ENCOUNTER — Ambulatory Visit (INDEPENDENT_AMBULATORY_CARE_PROVIDER_SITE_OTHER): Payer: 59

## 2013-01-19 DIAGNOSIS — J309 Allergic rhinitis, unspecified: Secondary | ICD-10-CM

## 2013-01-27 ENCOUNTER — Ambulatory Visit: Payer: 59

## 2013-01-28 ENCOUNTER — Ambulatory Visit (INDEPENDENT_AMBULATORY_CARE_PROVIDER_SITE_OTHER): Payer: 59

## 2013-01-28 DIAGNOSIS — J309 Allergic rhinitis, unspecified: Secondary | ICD-10-CM

## 2013-02-03 ENCOUNTER — Ambulatory Visit (INDEPENDENT_AMBULATORY_CARE_PROVIDER_SITE_OTHER): Payer: 59

## 2013-02-03 DIAGNOSIS — J309 Allergic rhinitis, unspecified: Secondary | ICD-10-CM

## 2013-02-10 ENCOUNTER — Ambulatory Visit: Payer: 59

## 2013-02-16 ENCOUNTER — Ambulatory Visit (INDEPENDENT_AMBULATORY_CARE_PROVIDER_SITE_OTHER): Payer: 59

## 2013-02-16 DIAGNOSIS — J309 Allergic rhinitis, unspecified: Secondary | ICD-10-CM

## 2013-02-24 ENCOUNTER — Ambulatory Visit (INDEPENDENT_AMBULATORY_CARE_PROVIDER_SITE_OTHER): Payer: 59

## 2013-02-24 DIAGNOSIS — J309 Allergic rhinitis, unspecified: Secondary | ICD-10-CM

## 2013-03-04 ENCOUNTER — Observation Stay (HOSPITAL_COMMUNITY)
Admission: EM | Admit: 2013-03-04 | Discharge: 2013-03-05 | Disposition: A | Discharge status: Home / Self Care | Payer: 59 | Attending: Surgery | Admitting: Surgery

## 2013-03-04 ENCOUNTER — Emergency Department (HOSPITAL_COMMUNITY): Payer: 59

## 2013-03-04 ENCOUNTER — Encounter (HOSPITAL_COMMUNITY): Payer: Self-pay | Admitting: Certified Registered"

## 2013-03-04 ENCOUNTER — Encounter (HOSPITAL_COMMUNITY)
Admission: EM | Disposition: A | Discharge status: Home / Self Care | Payer: Self-pay | Source: Home / Self Care | Attending: Emergency Medicine

## 2013-03-04 ENCOUNTER — Encounter (HOSPITAL_COMMUNITY): Payer: Self-pay

## 2013-03-04 ENCOUNTER — Emergency Department (HOSPITAL_COMMUNITY): Payer: 59 | Admitting: Certified Registered"

## 2013-03-04 DIAGNOSIS — J45909 Unspecified asthma, uncomplicated: Secondary | ICD-10-CM | POA: Insufficient documentation

## 2013-03-04 DIAGNOSIS — Z79899 Other long term (current) drug therapy: Secondary | ICD-10-CM | POA: Insufficient documentation

## 2013-03-04 DIAGNOSIS — K3580 Unspecified acute appendicitis: Secondary | ICD-10-CM

## 2013-03-04 HISTORY — PX: LAPAROSCOPIC APPENDECTOMY: SHX408

## 2013-03-04 LAB — CBC WITH DIFFERENTIAL/PLATELET
Basophils Absolute: 0 10*3/uL (ref 0.0–0.1)
Eosinophils Relative: 0 % (ref 0–5)
HCT: 40.4 % (ref 36.0–46.0)
Lymphocytes Relative: 12 % (ref 12–46)
MCH: 33.2 pg (ref 26.0–34.0)
MCV: 94.4 fL (ref 78.0–100.0)
Monocytes Absolute: 1.4 10*3/uL — ABNORMAL HIGH (ref 0.1–1.0)
RDW: 12.5 % (ref 11.5–15.5)
WBC: 13.7 10*3/uL — ABNORMAL HIGH (ref 4.0–10.5)

## 2013-03-04 LAB — URINALYSIS, ROUTINE W REFLEX MICROSCOPIC
Bilirubin Urine: NEGATIVE
Glucose, UA: NEGATIVE mg/dL
Hgb urine dipstick: NEGATIVE
Ketones, ur: NEGATIVE mg/dL
Protein, ur: NEGATIVE mg/dL

## 2013-03-04 LAB — COMPREHENSIVE METABOLIC PANEL
CO2: 26 mEq/L (ref 19–32)
Calcium: 9.6 mg/dL (ref 8.4–10.5)
Creatinine, Ser: 0.77 mg/dL (ref 0.50–1.10)
GFR calc Af Amer: >90 mL/min (ref >90)
GFR calc non Af Amer: 86 mL/min — ABNORMAL LOW (ref >90)
Glucose, Bld: 119 mg/dL — ABNORMAL HIGH (ref 70–99)

## 2013-03-04 SURGERY — APPENDECTOMY, LAPAROSCOPIC
Anesthesia: General | Site: Abdomen | Wound class: Dirty or Infected

## 2013-03-04 MED ORDER — SODIUM CHLORIDE 0.9 % IV SOLN
1000.0000 mL | Freq: Once | INTRAVENOUS | Status: AC
  Administered 2013-03-04: 1000 mL via INTRAVENOUS

## 2013-03-04 MED ORDER — IOHEXOL 300 MG/ML  SOLN
50.0000 mL | Freq: Once | INTRAMUSCULAR | Status: AC | PRN
  Administered 2013-03-04: 50 mL via ORAL

## 2013-03-04 MED ORDER — IOHEXOL 300 MG/ML  SOLN
100.0000 mL | Freq: Once | INTRAMUSCULAR | Status: AC | PRN
  Administered 2013-03-04: 100 mL via INTRAVENOUS

## 2013-03-04 MED ORDER — SODIUM CHLORIDE 0.9 % IV SOLN
1000.0000 mL | INTRAVENOUS | Status: DC
  Administered 2013-03-04: 1000 mL via INTRAVENOUS

## 2013-03-04 MED ORDER — ACETAMINOPHEN 10 MG/ML IV SOLN
INTRAVENOUS | Status: AC
  Filled 2013-03-04: qty 100

## 2013-03-04 MED ORDER — ACETAMINOPHEN 10 MG/ML IV SOLN
INTRAVENOUS | Status: DC | PRN
  Administered 2013-03-04: 1000 mg via INTRAVENOUS

## 2013-03-04 MED ORDER — BUPIVACAINE HCL (PF) 0.25 % IJ SOLN
INTRAMUSCULAR | Status: AC
  Filled 2013-03-04: qty 30

## 2013-03-04 MED ORDER — CEFOXITIN SODIUM-DEXTROSE 1-4 GM-% IV SOLR (PREMIX)
INTRAVENOUS | Status: AC
  Filled 2013-03-04: qty 100

## 2013-03-04 MED ORDER — MORPHINE SULFATE 4 MG/ML IJ SOLN
4.0000 mg | Freq: Once | INTRAMUSCULAR | Status: AC
  Administered 2013-03-04: 4 mg via INTRAVENOUS
  Filled 2013-03-04: qty 1

## 2013-03-04 MED ORDER — ONDANSETRON HCL 4 MG/2ML IJ SOLN
4.0000 mg | Freq: Once | INTRAMUSCULAR | Status: AC
  Administered 2013-03-04: 4 mg via INTRAVENOUS
  Filled 2013-03-04: qty 2

## 2013-03-04 SURGICAL SUPPLY — 36 items
APPLIER CLIP ROT 10 11.4 M/L (STAPLE)
BENZOIN TINCTURE PRP APPL 2/3 (GAUZE/BANDAGES/DRESSINGS) IMPLANT
CANISTER SUCTION 2500CC (MISCELLANEOUS) ×2 IMPLANT
CLIP APPLIE ROT 10 11.4 M/L (STAPLE) IMPLANT
CLOTH BEACON ORANGE TIMEOUT ST (SAFETY) ×2 IMPLANT
CUTTER FLEX LINEAR 45M (STAPLE) ×2 IMPLANT
DECANTER SPIKE VIAL GLASS SM (MISCELLANEOUS) IMPLANT
DERMABOND ADVANCED (GAUZE/BANDAGES/DRESSINGS) ×1
DERMABOND ADVANCED .7 DNX12 (GAUZE/BANDAGES/DRESSINGS) ×1 IMPLANT
DRAPE LAPAROSCOPIC ABDOMINAL (DRAPES) ×2 IMPLANT
ELECT REM PT RETURN 9FT ADLT (ELECTROSURGICAL) ×2
ELECTRODE REM PT RTRN 9FT ADLT (ELECTROSURGICAL) ×1 IMPLANT
ENDOLOOP SUT PDS II  0 18 (SUTURE)
ENDOLOOP SUT PDS II 0 18 (SUTURE) IMPLANT
GLOVE BIOGEL PI IND STRL 7.0 (GLOVE) ×1 IMPLANT
GLOVE BIOGEL PI INDICATOR 7.0 (GLOVE) ×1
GLOVE SURG SIGNA 7.5 PF LTX (GLOVE) ×2 IMPLANT
GOWN STRL NON-REIN LRG LVL3 (GOWN DISPOSABLE) ×2 IMPLANT
GOWN STRL REIN XL XLG (GOWN DISPOSABLE) ×4 IMPLANT
KIT BASIN OR (CUSTOM PROCEDURE TRAY) ×2 IMPLANT
PENCIL BUTTON HOLSTER BLD 10FT (ELECTRODE) ×2 IMPLANT
POUCH SPECIMEN RETRIEVAL 10MM (ENDOMECHANICALS) ×2 IMPLANT
RELOAD 45 VASCULAR/THIN (ENDOMECHANICALS) IMPLANT
RELOAD STAPLE TA45 3.5 REG BLU (ENDOMECHANICALS) ×2 IMPLANT
SCALPEL HARMONIC ACE (MISCELLANEOUS) ×2 IMPLANT
SET IRRIG TUBING LAPAROSCOPIC (IRRIGATION / IRRIGATOR) ×2 IMPLANT
SOLUTION ANTI FOG 6CC (MISCELLANEOUS) ×2 IMPLANT
STRIP CLOSURE SKIN 1/4X3 (GAUZE/BANDAGES/DRESSINGS) IMPLANT
SUT MON AB 5-0 PS2 18 (SUTURE) ×2 IMPLANT
TOWEL OR 17X26 10 PK STRL BLUE (TOWEL DISPOSABLE) ×2 IMPLANT
TRAY FOLEY CATH 14FRSI W/METER (CATHETERS) ×2 IMPLANT
TRAY LAP CHOLE (CUSTOM PROCEDURE TRAY) ×2 IMPLANT
TROCAR XCEL BLUNT TIP 100MML (ENDOMECHANICALS) ×2 IMPLANT
TROCAR Z-THREAD FIOS 11X100 BL (TROCAR) ×2 IMPLANT
TROCAR Z-THREAD FIOS 5X100MM (TROCAR) ×2 IMPLANT
TUBING INSUFFLATION 10FT LAP (TUBING) ×2 IMPLANT

## 2013-03-04 NOTE — Anesthesia Preprocedure Evaluation (Addendum)
Anesthesia Evaluation  Patient identified by MRN, date of birth, ID band Patient awake    Reviewed: Allergy & Precautions, H&P , NPO status , Patient's Chart, lab work & pertinent test results  Airway Mallampati: I TM Distance: >3 FB Neck ROM: Full    Dental  (+) Dental Advisory Given, Teeth Intact and Caps   Pulmonary asthma ,  breath sounds clear to auscultation        Cardiovascular negative cardio ROS  Rhythm:Regular Rate:Normal     Neuro/Psych negative neurological ROS  negative psych ROS   GI/Hepatic negative GI ROS, Neg liver ROS,   Endo/Other  negative endocrine ROS  Renal/GU negative Renal ROS     Musculoskeletal negative musculoskeletal ROS (+)   Abdominal   Peds  Hematology negative hematology ROS (+)   Anesthesia Other Findings   Reproductive/Obstetrics negative OB ROS                          Anesthesia Physical Anesthesia Plan  ASA: II and emergent  Anesthesia Plan: General   Post-op Pain Management:    Induction: Intravenous and Rapid sequence  Airway Management Planned: Oral ETT  Additional Equipment:   Intra-op Plan:   Post-operative Plan: Extubation in OR  Informed Consent: I have reviewed the patients History and Physical, chart, labs and discussed the procedure including the risks, benefits and alternatives for the proposed anesthesia with the patient or authorized representative who has indicated his/her understanding and acceptance.   Dental advisory given  Plan Discussed with: CRNA  Anesthesia Plan Comments:         Anesthesia Quick Evaluation

## 2013-03-04 NOTE — ED Provider Notes (Signed)
History     CSN: [id number]  Arrival date & time 03/04/13  1622   First MD Initiated Contact with Patient 03/04/13 1650      Chief Complaint  Patient presents with  . Abdominal Pain    Patient is a 67 y.o. female presenting with abdominal pain. The history is provided by the patient.  Abdominal Pain Pain location:  RLQ Pain quality: aching   Pain radiates to:  Does not radiate Pain severity:  Moderate Onset quality:  Gradual Timing:  Constant Progression:  Worsening Worsened by:  Movement and palpation Ineffective treatments:  None tried Associated symptoms: anorexia, nausea and vomiting   Associated symptoms: no cough, no dysuria and no fever   Pt saw her doctor in the office today and was sent to the ED for further testing.  Past Medical History  Diagnosis Date  . Multiple allergies   . Asthma   . HSV infection     BUTTOCK  . Neck injury   . Atrophic vaginitis     uses vagifem 3 X week to  . Osteopenia 09/03/2012    T score -1.9 FRAX 9.8%/1.4%    Past Surgical History  Procedure Laterality Date  . Tonsillectomy      and adenoids  . Foot surgery      BILATERAL ORTHO    Family History  Problem Relation Age of Onset  . Diabetes Mother   . Heart disease Mother   . Diabetes Brother     History  Substance Use Topics  . Smoking status: Never Smoker   . Smokeless tobacco: Never Used  . Alcohol Use: Yes     Comment: occas    OB History   Grav Para Term Preterm Abortions TAB SAB Ect Mult Living   1 1 1       1       Review of Systems  Constitutional: Negative for fever.  Respiratory: Negative for cough.   Gastrointestinal: Positive for nausea, vomiting, abdominal pain and anorexia.  Genitourinary: Negative for dysuria.  All other systems reviewed and are negative.    Allergies  Bee venom; Shellfish allergy; Codeine; Doxycycline; and Erythromycin  Home Medications   Current Outpatient Rx  Name  Route  Sig  Dispense  Refill  . albuterol  (PROVENTIL HFA;VENTOLIN HFA) 108 (90 BASE) MCG/ACT inhaler   Inhalation   Inhale 2 puffs into the lungs every 6 (six) hours as needed for wheezing.         Marland Kitchen ALPRAZolam (XANAX) 0.25 MG tablet   Oral   Take 0.25 mg by mouth at bedtime as needed for anxiety.          Marland Kitchen azelastine (ASTELIN) 137 MCG/SPRAY nasal spray   Nasal   Place 1 spray into the nose 2 (two) times daily. Use in each nostril as directed         . Calcium Carbonate (CALCIUM 500 PO)   Oral   Take 1 tablet by mouth daily.          . carisoprodol (SOMA) 350 MG tablet   Oral   Take 350 mg by mouth 4 (four) times daily as needed for muscle spasms.         . cholecalciferol (VITAMIN D) 1000 UNITS tablet   Oral   Take 1,000 Units by mouth daily.           Marland Kitchen EPINEPHrine (EPI-PEN) 0.3 mg/0.3 mL DEVI   Intramuscular   Inject 0.3 mLs (0.3 mg total) into  the muscle once.   1 Device   prn   . escitalopram (LEXAPRO) 10 MG tablet   Oral   Take 10 mg by mouth daily.         . Estradiol 10 MCG TABS   Vaginal   Place 10 mcg vaginally 2 (two) times a week. Tuesday and Thursday         . fluticasone (FLONASE) 50 MCG/ACT nasal spray   Nasal   Place 2 sprays into the nose daily.   48 g   1   . montelukast (SINGULAIR) 10 MG tablet   Oral   Take 10 mg by mouth daily.         . valACYclovir (VALTREX) 500 MG tablet   Oral   Take 1 tablet (500 mg total) by mouth 2 (two) times daily. As needed for outbreak   30 tablet   3     BP 127/65  Pulse 86  Temp(Src) 98.7 F (37.1 C) (Oral)  Resp 18  SpO2 99%  Physical Exam  Nursing note and vitals reviewed. Constitutional: She appears well-developed and well-nourished. No distress.  HENT:  Head: Normocephalic and atraumatic.  Right Ear: External ear normal.  Left Ear: External ear normal.  Eyes: Conjunctivae are normal. Right eye exhibits no discharge. Left eye exhibits no discharge. No scleral icterus.  Neck: Neck supple. No tracheal deviation  present.  Cardiovascular: Normal rate, regular rhythm and intact distal pulses.   Pulmonary/Chest: Effort normal and breath sounds normal. No stridor. No respiratory distress. She has no wheezes. She has no rales.  Abdominal: Soft. Bowel sounds are normal. She exhibits no distension. There is tenderness in the right lower quadrant, suprapubic area and left lower quadrant. There is guarding. There is no rigidity and no rebound. No hernia.  Musculoskeletal: She exhibits no edema and no tenderness.  Neurological: She is alert. She has normal strength. No sensory deficit. Cranial nerve deficit:  no gross defecits noted. She exhibits normal muscle tone. She displays no seizure activity. Coordination normal.  Skin: Skin is warm and dry. No rash noted.  Psychiatric: She has a normal mood and affect.    ED Course  Procedures (including critical care time)  Labs Reviewed  URINALYSIS, ROUTINE W REFLEX MICROSCOPIC - Abnormal; Notable for the following:    APPearance CLOUDY (*)    All other components within normal limits  CBC WITH DIFFERENTIAL - Abnormal; Notable for the following:    WBC 13.7 (*)    Neutrophils Relative 78 (*)    Neutro Abs 10.6 (*)    Monocytes Absolute 1.4 (*)    All other components within normal limits  COMPREHENSIVE METABOLIC PANEL - Abnormal; Notable for the following:    Glucose, Bld 119 (*)    Total Bilirubin 1.3 (*)    GFR calc non Af Amer 86 (*)    All other components within normal limits  LIPASE, BLOOD   Ct Abdomen Pelvis W Contrast  03/04/2013  *RADIOLOGY REPORT*  Clinical Data: 67 year old with abdominal pain.  CT ABDOMEN AND PELVIS WITH CONTRAST  Technique:  Multidetector CT imaging of the abdomen and pelvis was performed following the standard protocol during bolus administration of intravenous contrast.  Contrast: OMNIPAQUE IOHEXOL 300 MG/ML  SOLN  Comparison: None.  Findings: Lung bases are clear.  There is no evidence for free intraperitoneal air.   Decreased attenuation of the liver could be associated with hepatic steatosis.  There is a round low density structure in the  central left hepatic lobe that measures 1 cm and suggestive for a cyst. Normal appearance of the gallbladder and portal venous system. Normal appearance of the pancreas, spleen, adrenal glands and both kidneys.  1.4 cm cortical low density structure in the right kidney is suggestive for a cyst.  Atherosclerotic calcifications in the abdominal aorta without aneurysm.  The appendix is distended with surrounding inflammation.  Appendix measures up to 1 cm.  Findings are compatible with acute appendicitis.  There may be a small amount of fluid around the appendix.  No significant free fluid in the cul-de-sac.  No gross abnormality to the uterus or adnexal structures.  Normal appearance of the urinary bladder.  A few prominent lymph nodes along the right side of the abdominal mesentery.  No acute bony abnormality.  IMPRESSION: Acute appendicitis.  Inflammation around the appendix with a small amount of fluid.  An appendix perforation cannot be excluded.  No evidence for an abscess formation.  Low attenuation of the liver raises concern for hepatic steatosis.  Critical Value/emergent results were called by telephone at the time of interpretation on 03/04/2013 at 7:25 p.m. to Dr. Lynelle Doctor, who verbally acknowledged these results.   Original Report Authenticated By: Richarda Overlie, M.D.      1. Acute appendicitis       MDM  The pulmonary CT scan reading suggests an acute appendicitis. There is concern for potential rupture although there is no discrete abscess. Will consult with general surgery. Continue IV fluid resuscitation and plan on admission for further treatment        Celene Kras, MD 03/04/13 2254

## 2013-03-04 NOTE — H&P (Signed)
Re:   Carrie Cox DOB:   [birth year] MRN:   [id number]  ASSESSMENT AND PLAN: 1.  Acute appendicitis  I discussed with the patient the indications and risks of appendiceal surgery.  The primary risks of appendiceal surgery include, but are not limited to, bleeding, infection, bowel surgery, and open surgery.  There is also the risk that the patient may have continued symptoms after surgery.  However, the likelihood of improvement in symptoms and return to the patient's normal status is good. We discussed the typical post-operative recovery course. I tried to answer the patient's questions.  2.  Asthma  Allergy induced.  She sees Dr. Melba Coon.  Gets allergy shots. 3.  History of neck injury as a teenager.  Gets PT for the neck monthly. 4.  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   Chief Complaint  Patient presents with  . Abdominal Pain   REFERRING PHYSICIAN:  Dr. Iantha Fallen, [facility]  HISTORY OF PRESENT ILLNESS: Carrie Cox is a 67 y.o. (DOB: 1946-06-25)  white  female whose primary care physician is Hollice Espy, MD and comes to the [facility] today for abdominal pain.  She started feeling poorly Sunday, 01/29/2013.  She was hot, nauseated, and had a poor appetite.  She did okay through the week, but last night felt worse and started vomiting.  She saw Dr. Kevan Ny today, who sent her to the ER for further evaluation.  She has not GI history.  She had a colonoscopy in 2009, but cannot remember the name of the GI doctor. CT scan consistent with appendicitis.    WBC - 13,700 - 02/02/2013    Past Medical History  Diagnosis Date  . Multiple allergies   . Asthma   . HSV infection     BUTTOCK  . Neck injury   . Atrophic vaginitis     uses vagifem 3 X week to  . Osteopenia 09/03/2012    T score -1.9 FRAX 9.8%/1.4%      Past Surgical History  Procedure Laterality Date  . Tonsillectomy    . Foot surgery      BILATERAL ORTHO      Current Facility-Administered Medications  Medication Dose  Route Frequency Provider Last Rate Last Dose  . 0.9 %  sodium chloride infusion  1,000 mL Intravenous Continuous Celene Kras, MD 125 mL/hr at 03/04/13 1827 1,000 mL at 03/04/13 1827   Current Outpatient Prescriptions  Medication Sig Dispense Refill  . albuterol (PROVENTIL HFA;VENTOLIN HFA) 108 (90 BASE) MCG/ACT inhaler Inhale 2 puffs into the lungs every 6 (six) hours as needed for wheezing.      Marland Kitchen ALPRAZolam (XANAX) 0.25 MG tablet Take 0.25 mg by mouth at bedtime as needed for anxiety.       Marland Kitchen azelastine (ASTELIN) 137 MCG/SPRAY nasal spray Place 1 spray into the nose 2 (two) times daily. Use in each nostril as directed      . Calcium Carbonate (CALCIUM 500 PO) Take 1 tablet by mouth daily.       . carisoprodol (SOMA) 350 MG tablet Take 350 mg by mouth 4 (four) times daily as needed for muscle spasms.      . cholecalciferol (VITAMIN D) 1000 UNITS tablet Take 1,000 Units by mouth daily.        Marland Kitchen EPINEPHrine (EPI-PEN) 0.3 mg/0.3 mL DEVI Inject 0.3 mLs (0.3 mg total) into the muscle once.  1 Device  prn  . escitalopram (LEXAPRO) 10 MG tablet Take 10 mg by  mouth daily.      . Estradiol 10 MCG TABS Place 10 mcg vaginally 2 (two) times a week. Tuesday and Thursday      . fluticasone (FLONASE) 50 MCG/ACT nasal spray Place 2 sprays into the nose daily.  48 g  1  . montelukast (SINGULAIR) 10 MG tablet Take 10 mg by mouth daily.      . valACYclovir (VALTREX) 500 MG tablet Take 1 tablet (500 mg total) by mouth 2 (two) times daily. As needed for outbreak  30 tablet  3      Allergies  Allergen Reactions  . Codeine   . Doxycycline     Stomach pain  . Erythromycin     REVIEW OF SYSTEMS: Skin:  No history of rash.  No history of abnormal moles. Infection:  No history of hepatitis or HIV.  No history of MRSA. Neurologic:  No history of stroke.  No history of seizure.  No history of headaches. Cardiac:  No history of hypertension. No history of heart disease.   No history of seeing a cardiologist.   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 Pulmonary:  Allergy induces asthma.  Sees Dr. Melba Coon.  On inhalers.  Gets allergy shots.  Endocrine:  No diabetes. No thyroid disease. Gastrointestinal:  No history of stomach disease.  No history of liver disease.  No history of gall bladder disease.  No history of pancreas disease.  No history of colon disease. Urologic:  No history of kidney stones.  No history of bladder infections. Musculoskeletal:  Neck injury at age 79, now does PT monthly. Hematologic:  She says that she tends to bleed easily, but no known disease. Psycho-social:  The patient is oriented.   The patient has no obvious psychologic or social impairment to understanding our conversation and plan.  SOCIAL and FAMILY HISTORY: Husband, [name].  Phone - [phone]. 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 No children  PHYSICAL EXAM: There were no vitals taken for this visit.  General: WN WF who is alert and generally healthy appearing.  HEENT: Normal. Pupils equal. Neck: Supple. No mass.  No thyroid mass. Lymph Nodes:  No supraclavicular or cervical nodes. Lungs: Clear to auscultation and symmetric breath sounds. Heart:  RRR. No murmur or rub.  Abdomen: Soft. No mass. No hernia.  No abdominal scars.  Tender with guarding in the RLQ.  Mild rebound. Rectal: Not done. Extremities:  Good strength and ROM  in upper and lower extremities. Neurologic:  Grossly intact to motor and sensory function. Psychiatric: Has normal mood and affect. Behavior is normal.   DATA REVIEWED: CT scan  Ovidio Kin, MD,  [facility] [address] [place]  [address]   [place], [place]    [address] Phone:  [phone] FAX:  [phone]

## 2013-03-04 NOTE — ED Notes (Addendum)
Pt presents with abdominal pain that began about 3 days ago. Pt was throwing up last night. No diarrhea but is having frequent bowel movements. Sent by PCP to rule out diverticulitis and appendicitis. Pt is having pain in her right lower quadrant.

## 2013-03-04 NOTE — ED Notes (Signed)
MD at bedside speaking with pt and family.  Stated Dr. Ezzard Standing will be in to see pt soon.

## 2013-03-05 ENCOUNTER — Encounter (HOSPITAL_COMMUNITY): Payer: Self-pay | Admitting: Surgery

## 2013-03-05 MED ORDER — NEOSTIGMINE METHYLSULFATE 1 MG/ML IJ SOLN
INTRAMUSCULAR | Status: DC | PRN
  Administered 2013-03-05: 4 mg via INTRAVENOUS

## 2013-03-05 MED ORDER — LACTATED RINGERS IR SOLN
Status: DC | PRN
  Administered 2013-03-05: 1000 mL

## 2013-03-05 MED ORDER — DEXAMETHASONE SODIUM PHOSPHATE 10 MG/ML IJ SOLN
INTRAMUSCULAR | Status: DC | PRN
  Administered 2013-03-05: 10 mg via INTRAVENOUS

## 2013-03-05 MED ORDER — HYDROMORPHONE HCL PF 1 MG/ML IJ SOLN: 0.2500 mg | INTRAMUSCULAR | Status: DC | PRN

## 2013-03-05 MED ORDER — ACETAMINOPHEN 10 MG/ML IV SOLN: 1000.0000 mg | Freq: Once | INTRAVENOUS | Status: DC | PRN

## 2013-03-05 MED ORDER — ONDANSETRON HCL 4 MG/2ML IJ SOLN
4.0000 mg | Freq: Four times a day (QID) | INTRAMUSCULAR | Status: DC | PRN
  Administered 2013-03-05: 4 mg via INTRAVENOUS
  Filled 2013-03-05: qty 2

## 2013-03-05 MED ORDER — FLUTICASONE PROPIONATE 50 MCG/ACT NA SUSP
2.0000 | Freq: Every day | NASAL | Status: DC
  Administered 2013-03-05: 2 via NASAL
  Filled 2013-03-05: qty 16

## 2013-03-05 MED ORDER — MEPERIDINE HCL 50 MG/ML IJ SOLN: 6.2500 mg | INTRAMUSCULAR | Status: DC | PRN

## 2013-03-05 MED ORDER — ALPRAZOLAM 0.25 MG PO TABS: 0.2500 mg | ORAL_TABLET | Freq: Every evening | ORAL | Status: DC | PRN

## 2013-03-05 MED ORDER — HEPARIN SODIUM (PORCINE) 5000 UNIT/ML IJ SOLN
5000.0000 [IU] | Freq: Three times a day (TID) | INTRAMUSCULAR | Status: DC
  Administered 2013-03-05: 5000 [IU] via SUBCUTANEOUS
  Filled 2013-03-05 (×4): qty 1

## 2013-03-05 MED ORDER — ONDANSETRON HCL 4 MG/2ML IJ SOLN
INTRAMUSCULAR | Status: DC | PRN
  Administered 2013-03-05: 4 mg via INTRAVENOUS

## 2013-03-05 MED ORDER — ONDANSETRON HCL 4 MG PO TABS: 4.0000 mg | ORAL_TABLET | Freq: Four times a day (QID) | ORAL | Status: DC | PRN

## 2013-03-05 MED ORDER — DEXTROSE 5 % IV SOLN
1.0000 g | Freq: Four times a day (QID) | INTRAVENOUS | Status: DC
  Administered 2013-03-05 (×2): 1 g via INTRAVENOUS
  Filled 2013-03-05 (×4): qty 1

## 2013-03-05 MED ORDER — ALBUTEROL SULFATE HFA 108 (90 BASE) MCG/ACT IN AERS: 2.0000 | INHALATION_SPRAY | Freq: Four times a day (QID) | RESPIRATORY_TRACT | Status: DC | PRN

## 2013-03-05 MED ORDER — 0.9 % SODIUM CHLORIDE (POUR BTL) OPTIME
TOPICAL | Status: DC | PRN
  Administered 2013-03-05: 1000 mL

## 2013-03-05 MED ORDER — BUPIVACAINE HCL (PF) 0.25 % IJ SOLN
INTRAMUSCULAR | Status: DC | PRN
  Administered 2013-03-05: 20 mL

## 2013-03-05 MED ORDER — AMOXICILLIN-POT CLAVULANATE 875-125 MG PO TABS: 1.0000 | ORAL_TABLET | Freq: Two times a day (BID) | ORAL | Status: DC

## 2013-03-05 MED ORDER — PROPOFOL 10 MG/ML IV BOLUS
INTRAVENOUS | Status: DC | PRN
  Administered 2013-03-04: 160 mg via INTRAVENOUS

## 2013-03-05 MED ORDER — MIDAZOLAM HCL 5 MG/5ML IJ SOLN
INTRAMUSCULAR | Status: DC | PRN
  Administered 2013-03-04: 2 mg via INTRAVENOUS

## 2013-03-05 MED ORDER — MONTELUKAST SODIUM 10 MG PO TABS
10.0000 mg | ORAL_TABLET | Freq: Every day | ORAL | Status: DC
  Administered 2013-03-05: 10 mg via ORAL
  Filled 2013-03-05: qty 1

## 2013-03-05 MED ORDER — LACTATED RINGERS IV SOLN
INTRAVENOUS | Status: DC | PRN
  Administered 2013-03-05: 01:00:00 via INTRAVENOUS

## 2013-03-05 MED ORDER — ESCITALOPRAM OXALATE 10 MG PO TABS
10.0000 mg | ORAL_TABLET | Freq: Every day | ORAL | Status: DC
  Administered 2013-03-05: 10 mg via ORAL
  Filled 2013-03-05: qty 1

## 2013-03-05 MED ORDER — MORPHINE SULFATE 2 MG/ML IJ SOLN
1.0000 mg | INTRAMUSCULAR | Status: DC | PRN
  Administered 2013-03-05 (×2): 2 mg via INTRAVENOUS
  Filled 2013-03-05 (×2): qty 1

## 2013-03-05 MED ORDER — GLYCOPYRROLATE 0.2 MG/ML IJ SOLN
INTRAMUSCULAR | Status: DC | PRN
Start: 2013-03-05 — End: 2013-03-05
  Administered 2013-03-05: 0.6 mg via INTRAVENOUS

## 2013-03-05 MED ORDER — SUCCINYLCHOLINE CHLORIDE 20 MG/ML IJ SOLN
INTRAMUSCULAR | Status: DC | PRN
  Administered 2013-03-04: 100 mg via INTRAVENOUS

## 2013-03-05 MED ORDER — HYDROCODONE-ACETAMINOPHEN 5-325 MG PO TABS: 1.0000 | ORAL_TABLET | ORAL | Status: DC | PRN

## 2013-03-05 MED ORDER — PROMETHAZINE HCL 25 MG/ML IJ SOLN: 6.2500 mg | INTRAMUSCULAR | Status: DC | PRN

## 2013-03-05 MED ORDER — IBUPROFEN 600 MG PO TABS
600.0000 mg | ORAL_TABLET | Freq: Four times a day (QID) | ORAL | Status: DC | PRN
  Filled 2013-03-05: qty 1

## 2013-03-05 MED ORDER — LIDOCAINE HCL (CARDIAC) 20 MG/ML IV SOLN
INTRAVENOUS | Status: DC | PRN
  Administered 2013-03-04: 50 mg via INTRAVENOUS

## 2013-03-05 MED ORDER — AZELASTINE HCL 0.1 % NA SOLN
1.0000 | Freq: Two times a day (BID) | NASAL | Status: DC
  Administered 2013-03-05: 1 via NASAL
  Filled 2013-03-05: qty 30

## 2013-03-05 MED ORDER — FENTANYL CITRATE 0.05 MG/ML IJ SOLN
INTRAMUSCULAR | Status: DC | PRN
  Administered 2013-03-04: 100 ug via INTRAVENOUS
  Administered 2013-03-05 (×3): 50 ug via INTRAVENOUS

## 2013-03-05 MED ORDER — LACTATED RINGERS IV SOLN: INTRAVENOUS | Status: DC

## 2013-03-05 MED ORDER — ROCURONIUM BROMIDE 100 MG/10ML IV SOLN
INTRAVENOUS | Status: DC | PRN
  Administered 2013-03-04: 30 mg via INTRAVENOUS

## 2013-03-05 MED ORDER — KCL IN DEXTROSE-NACL 20-5-0.45 MEQ/L-%-% IV SOLN
INTRAVENOUS | Status: DC
  Administered 2013-03-05: 02:00:00 via INTRAVENOUS
  Filled 2013-03-05 (×3): qty 1000

## 2013-03-05 NOTE — Care Management Note (Signed)
    Page 1 of 1   03/05/2013     11:09:56 AM   CARE MANAGEMENT NOTE 03/05/2013  Patient:  Carrie Cox, Carrie Cox   Account Number:  [id number]  Date Initiated:  03/05/2013  Documentation initiated by:  Lorenda Ishihara  Subjective/Objective Assessment:   67 yo female admitted s/p lap appy. PTA lived at home spouse.     Action/Plan:   Home when stable   Anticipated DC Date:  03/05/2013   Anticipated DC Plan:  HOME/SELF CARE      DC Planning Services  CM consult      Choice offered to / List presented to:             Status of service:  Completed, signed off Medicare Important Message given?   (If response is "NO", the following Medicare IM given date fields will be blank) Date Medicare IM given:   Date Additional Medicare IM given:    Discharge Disposition:  HOME/SELF CARE  Per UR Regulation:  Reviewed for med. necessity/level of care/duration of stay  If discussed at Long Length of Stay Meetings, dates discussed:    Comments:

## 2013-03-05 NOTE — Discharge Summary (Signed)
Patient ID: Carrie Cox MRN: [id number] DOB/AGE: [birth year] 67 y.o.  Admit date: 03/04/2013 Discharge date: 03/05/2013  Procedures: laparoscopic appendectomy  Consults: None  Reason for Admission:Carrie Cox is a 67 y.o. (DOB: Jul 27, 1946) white female whose primary care physician is Carrie Espy, MD and comes to the [facility] today for abdominal pain.  She started feeling poorly Sunday, 01/29/2013. She was hot, nauseated, and had a poor appetite. She did okay through the week, but last night felt worse and started vomiting. She saw Dr. Kevan Cox today, who sent her to the ER for further evaluation.  She has not GI history. She had a colonoscopy in 2009, but cannot remember the name of the GI doctor.  CT scan consistent with appendicitis. WBC - 13,700 - 02/02/2013    Admission Diagnoses:  1. Acute appendicitis  Hospital Course: The patient was admitted and taken to the operating room where she underwent a lap appy.  She was found to have suppurative appendicitis.  She tolerated the procedure well and on POD0 she was tolerating a regular diet and pain was well controlled.  She was stable for dc home.  PE; Abd: soft, appropriately tender, +BS, ND, incisions c/d/i with ecchymosis  Discharge Diagnoses:  1. Acute suppurative appendicitis, s/p lap appy  Discharge Medications:   Medication List    TAKE these medications       albuterol 108 (90 BASE) MCG/ACT inhaler  Commonly known as:  PROVENTIL HFA;VENTOLIN HFA  Inhale 2 puffs into the lungs every 6 (six) hours as needed for wheezing.     ALPRAZolam 0.25 MG tablet  Commonly known as:  XANAX  Take 0.25 mg by mouth at bedtime as needed for anxiety.     amoxicillin-clavulanate 875-125 MG per tablet  Commonly known as:  AUGMENTIN  Take 1 tablet by mouth 2 (two) times daily.     azelastine 137 MCG/SPRAY nasal spray  Commonly known as:  ASTELIN  Place 1 spray into the nose 2 (two) times daily. Use in each nostril as directed     CALCIUM  500 PO  Take 1 tablet by mouth daily.     carisoprodol 350 MG tablet  Commonly known as:  SOMA  Take 350 mg by mouth 4 (four) times daily as needed for muscle spasms.     cholecalciferol 1000 UNITS tablet  Commonly known as:  VITAMIN D  Take 1,000 Units by mouth daily.     EPINEPHrine 0.3 mg/0.3 mL Devi  Commonly known as:  EPI-PEN  Inject 0.3 mLs (0.3 mg total) into the muscle once.     escitalopram 10 MG tablet  Commonly known as:  LEXAPRO  Take 10 mg by mouth daily.     Estradiol 10 MCG Tabs  Place 10 mcg vaginally 2 (two) times a week. Tuesday and Thursday     fluticasone 50 MCG/ACT nasal spray  Commonly known as:  FLONASE  Place 2 sprays into the nose daily.     HYDROcodone-acetaminophen 5-325 MG per tablet  Commonly known as:  NORCO/VICODIN  Take 1-2 tablets by mouth every 4 (four) hours as needed.     montelukast 10 MG tablet  Commonly known as:  SINGULAIR  Take 10 mg by mouth daily.     ondansetron 4 MG tablet  Commonly known as:  ZOFRAN  Take 1 tablet (4 mg total) by mouth every 6 (six) hours as needed for nausea.     valACYclovir 500 MG tablet  Commonly known as:  VALTREX  Take 1 tablet (500 mg total) by mouth 2 (two) times daily. As needed for outbreak        Discharge Instructions:     Follow-up Information   Follow up with [facility] On 03/23/2013. (11:30am, arrive at 11:00am)    Contact information:   [address]   [place] [address] [phone]       Signed: Letha Cape 03/05/2013, 10:57 AM

## 2013-03-05 NOTE — Op Note (Signed)
Re:   Carrie Cox DOB:   [birth year] MRN:   [id number]                   FACILITY:  [facility]  DATE OF PROCEDURE: 03/05/2013                              OPERATIVE REPORT  PREOPERATIVE DIAGNOSIS:  Appendicitis  POSTOPERATIVE DIAGNOSIS:  Acute suppurative appendicitis (not ruptured)  PROCEDURE:  Laparoscopic appendectomy.  SURGEON:  Sandria Bales. Ezzard Standing, MD  ASSISTANT:  No first assistant.  ANESTHESIA:  General endotracheal.  Anesthesiologist: Gaylan Gerold, MD CRNA: Enriqueta Shutter  ESTIMATED BLOOD LOSS:  Minimal.  DRAINS: none   SPECIMEN:   Appendix  COUNTS CORRECT:  YES  INDICATIONS FOR PROCEDURE: Carrie Cox is a 67 y.o. (DOB: 26-May-1946) white  female whose primary care doctor is Carrie Espy, MD and comes to the OR for an appendectomy.   I discussed with the patient, the indications and potential complications of appendiceal surgery.  The potential complications include, but are not limited to, bleeding, open surgery, bowel resection, and the possibility of another diagnosis.  OPERATIVE NOTE:  The patient underwent a general endotracheal anesthetic as supervised by Anesthesiologist: Gaylan Gerold, MD CRNA: Enriqueta Shutter, General, in room #1.  The patient was given 2 g of cefoxitin at the beginning of the procedure and the abdomen was prepped with ChloraPrep.  The patient had a foley catheter placed at the beginning of the procedure.  A time-out was held and surgical checklist run.  An infraumbilical incision was made with sharp dissection carried down to the abdominal cavity.  An 12 mm Hasson trocar was inserted through the infraumbilical incision and into the peritoneal cavity.  A 0 degree 10 mm laparoscope was inserted through a 12 mm Hasson trocar and the Hasson trocar secured with a 0 Vicryl suture.  I placed a 5 mm trocar in the right upper quadrant and 5 mm torcar in left lower quadrant and did abdominal exploration.    The right and left lobes of liver  unremarkable.  Stomach was unremarkable.  The pelvic organs were unremarkable.  I saw no other intra-abdominal abnormality.  The patient had appendicitis with the appendix located at the right pelvic brim.  It was wrapped in the terminal ileum and the antimesenteric fat of the terminal ileum.  There was gross purulence, but no evidence of perforation.  The mesentery of the appendix was divided with a Harmonic scalpel.  I got to the base of the appendix.  I then used a blue load 45 mm Ethicon Endo-GIA stapler and fired this across the base of the appendix.  I placed the appendix in EndoCatch bag and delivered the bag through the umbilical incision.  I irrigated the abdomen with 800 cc of saline.  After irrigating the abdomen, I then removed the trocars, in turn.  The umbilical port fascia was closed with 0 Vicryl suture.   I closed the skin each site with a 5-0 Monocryl suture and painted the wounds with Dermabond.  I then injected a total of 20 mL of 0.25% Marcaine at the incisions.  Sponge and needle count were correct at the end of the case.  The foley catheter was removed in the OR.  The patient was transferred to the recovery room in good condition.  The patient tolerated the procedure well and it depends on the patient's post op  clinical course as to when she could be discharged.  She should probably be sent home on antibiotics for a total of 5 to 7 days from surgery.   Ovidio Kin, MD, [facility] Pager: [phone] Office phone:  [phone]

## 2013-03-05 NOTE — Discharge Summary (Signed)
General surgery attending note:  I have interviewed and examined this patient this afternoon. I have discussed her outpatient care and followup with her and her husband. Her abdominal exam is unremarkable for postop lap appendectomy. I agree with the assessment and discharge plans outlined by Carrie Cox.  Carrie Cox. Derrell Lolling, M.D., [facility] General and Minimally invasive Surgery Breast and Colorectal Surgery Office:   [phone] Pager:   [phone]

## 2013-03-05 NOTE — Transfer of Care (Signed)
Immediate Anesthesia Transfer of Care Note  Patient: Carrie Cox  Procedure(s) Performed: Procedure(s): APPENDECTOMY LAPAROSCOPIC (N/A)  Patient Location: PACU  Anesthesia Type:General  Level of Consciousness: awake, alert  and oriented  Airway & Oxygen Therapy: Patient Spontanous Breathing and Patient connected to face mask oxygen  Post-op Assessment: Report given to PACU RN and Post -op Vital signs reviewed and stable  Post vital signs: Reviewed and stable  Complications: No apparent anesthesia complications

## 2013-03-05 NOTE — Anesthesia Postprocedure Evaluation (Signed)
Anesthesia Post Note  Patient: Carrie Cox  Procedure(s) Performed: Procedure(s) (LRB): APPENDECTOMY LAPAROSCOPIC (N/A)  Anesthesia type: General  Patient location: PACU  Post pain: Pain level controlled  Post assessment: Post-op Vital signs reviewed  Last Vitals: BP 108/66  Pulse 72  Temp(Src) 36.7 C (Oral)  Resp 16  SpO2 95%  Post vital signs: Reviewed  Level of consciousness: sedated  Complications: No apparent anesthesia complications

## 2013-03-05 NOTE — Progress Notes (Signed)
Report to ariel rn 5 west,  abd dsgs remain clean dry and intact

## 2013-03-16 ENCOUNTER — Other Ambulatory Visit: Payer: Self-pay | Admitting: Internal Medicine

## 2013-03-19 ENCOUNTER — Ambulatory Visit (INDEPENDENT_AMBULATORY_CARE_PROVIDER_SITE_OTHER): Payer: 59

## 2013-03-19 DIAGNOSIS — J309 Allergic rhinitis, unspecified: Secondary | ICD-10-CM

## 2013-03-23 ENCOUNTER — Ambulatory Visit (INDEPENDENT_AMBULATORY_CARE_PROVIDER_SITE_OTHER): Payer: 59 | Admitting: Internal Medicine

## 2013-03-23 ENCOUNTER — Encounter (INDEPENDENT_AMBULATORY_CARE_PROVIDER_SITE_OTHER): Payer: Self-pay

## 2013-03-23 VITALS — BP 134/84 | HR 68 | Temp 98.4°F | Resp 16 | Ht 64.0 in | Wt 140.0 lb

## 2013-03-23 DIAGNOSIS — K3580 Unspecified acute appendicitis: Secondary | ICD-10-CM

## 2013-03-23 NOTE — Patient Instructions (Signed)
May resume regular activity without restrictions. Follow up as needed. Call with questions or concerns.  

## 2013-03-23 NOTE — Progress Notes (Signed)
  Subjective: Pt returns to the clinic today after undergoing laparoscopic appendectomy on 03/05/13 by Dr. Ezzard Standing.  The patient is tolerating their diet well and is having no severe pain.  Bowel function is good.  No problems with the wounds.  Objective: Vital signs in last 24 hours: Reviewed  PE: Abd: soft, non-tender, +bs, incisions well healed  Lab Results:  No results found for this basename: WBC, HGB, HCT, PLT,  in the last 72 hours BMET No results found for this basename: NA, K, CL, CO2, GLUCOSE, BUN, CREATININE, CALCIUM,  in the last 72 hours PT/INR No results found for this basename: LABPROT, INR,  in the last 72 hours CMP     Component Value Date/Time   NA 137 03/04/2013 1720   K 4.0 03/04/2013 1720   CL 101 03/04/2013 1720   CO2 26 03/04/2013 1720   GLUCOSE 119* 03/04/2013 1720   BUN 10 03/04/2013 1720   CREATININE 0.77 03/04/2013 1720   CALCIUM 9.6 03/04/2013 1720   PROT 7.3 03/04/2013 1720   ALBUMIN 4.1 03/04/2013 1720   AST 24 03/04/2013 1720   ALT 26 03/04/2013 1720   ALKPHOS 75 03/04/2013 1720   BILITOT 1.3* 03/04/2013 1720   GFRNONAA 86* 03/04/2013 1720   GFRAA >90 03/04/2013 1720   Lipase     Component Value Date/Time   LIPASE 12 03/04/2013 1720       Studies/Results: No results found.  Anti-infectives: Anti-infectives   None       Assessment/Plan  1.  S/P Laparoscopic Appendectomy: doing well, may resume regular activity without restrictions, Pt will follow up with [place] PRN and knows to call with questions or concerns.     WHITE, ELIZABETH 03/23/2013

## 2013-03-31 ENCOUNTER — Ambulatory Visit (INDEPENDENT_AMBULATORY_CARE_PROVIDER_SITE_OTHER): Payer: 59

## 2013-03-31 DIAGNOSIS — J309 Allergic rhinitis, unspecified: Secondary | ICD-10-CM

## 2013-04-12 ENCOUNTER — Encounter: Payer: Self-pay | Admitting: Gynecology

## 2013-04-14 ENCOUNTER — Ambulatory Visit: Payer: 59

## 2013-04-16 ENCOUNTER — Ambulatory Visit (INDEPENDENT_AMBULATORY_CARE_PROVIDER_SITE_OTHER): Payer: 59

## 2013-04-16 DIAGNOSIS — J309 Allergic rhinitis, unspecified: Secondary | ICD-10-CM

## 2013-04-23 NOTE — Progress Notes (Signed)
error 

## 2013-04-29 ENCOUNTER — Ambulatory Visit (INDEPENDENT_AMBULATORY_CARE_PROVIDER_SITE_OTHER): Payer: 59

## 2013-04-29 DIAGNOSIS — J309 Allergic rhinitis, unspecified: Secondary | ICD-10-CM

## 2013-05-10 ENCOUNTER — Ambulatory Visit (INDEPENDENT_AMBULATORY_CARE_PROVIDER_SITE_OTHER): Payer: 59

## 2013-05-10 DIAGNOSIS — J309 Allergic rhinitis, unspecified: Secondary | ICD-10-CM

## 2013-05-21 ENCOUNTER — Ambulatory Visit (INDEPENDENT_AMBULATORY_CARE_PROVIDER_SITE_OTHER): Payer: 59

## 2013-05-21 DIAGNOSIS — J309 Allergic rhinitis, unspecified: Secondary | ICD-10-CM

## 2013-05-28 ENCOUNTER — Ambulatory Visit: Payer: 59

## 2013-06-02 ENCOUNTER — Other Ambulatory Visit: Payer: Self-pay

## 2013-06-04 ENCOUNTER — Ambulatory Visit (INDEPENDENT_AMBULATORY_CARE_PROVIDER_SITE_OTHER): Payer: 59

## 2013-06-04 DIAGNOSIS — J309 Allergic rhinitis, unspecified: Secondary | ICD-10-CM

## 2013-06-11 ENCOUNTER — Ambulatory Visit: Payer: 59

## 2013-06-14 ENCOUNTER — Other Ambulatory Visit: Payer: Self-pay | Admitting: Internal Medicine

## 2013-06-23 ENCOUNTER — Ambulatory Visit (INDEPENDENT_AMBULATORY_CARE_PROVIDER_SITE_OTHER): Payer: 59

## 2013-06-23 DIAGNOSIS — J309 Allergic rhinitis, unspecified: Secondary | ICD-10-CM

## 2013-07-08 ENCOUNTER — Ambulatory Visit (INDEPENDENT_AMBULATORY_CARE_PROVIDER_SITE_OTHER): Payer: 59

## 2013-07-08 DIAGNOSIS — J309 Allergic rhinitis, unspecified: Secondary | ICD-10-CM

## 2013-07-23 ENCOUNTER — Ambulatory Visit (INDEPENDENT_AMBULATORY_CARE_PROVIDER_SITE_OTHER): Payer: 59

## 2013-07-23 DIAGNOSIS — J309 Allergic rhinitis, unspecified: Secondary | ICD-10-CM

## 2013-07-30 ENCOUNTER — Ambulatory Visit: Payer: 59

## 2013-08-04 ENCOUNTER — Ambulatory Visit (INDEPENDENT_AMBULATORY_CARE_PROVIDER_SITE_OTHER): Payer: 59

## 2013-08-04 DIAGNOSIS — J309 Allergic rhinitis, unspecified: Secondary | ICD-10-CM

## 2013-08-11 ENCOUNTER — Other Ambulatory Visit (HOSPITAL_COMMUNITY)
Admission: RE | Admit: 2013-08-11 | Discharge: 2013-08-11 | Disposition: A | Discharge status: Home / Self Care | Payer: 59 | Source: Ambulatory Visit | Attending: Gynecology | Admitting: Gynecology

## 2013-08-11 ENCOUNTER — Ambulatory Visit (INDEPENDENT_AMBULATORY_CARE_PROVIDER_SITE_OTHER): Payer: 59 | Admitting: Gynecology

## 2013-08-11 ENCOUNTER — Encounter: Payer: Self-pay | Admitting: Gynecology

## 2013-08-11 VITALS — BP 120/78 | Ht 64.0 in | Wt 140.0 lb

## 2013-08-11 DIAGNOSIS — B009 Herpesviral infection, unspecified: Secondary | ICD-10-CM

## 2013-08-11 DIAGNOSIS — N8111 Cystocele, midline: Secondary | ICD-10-CM

## 2013-08-11 DIAGNOSIS — A609 Anogenital herpesviral infection, unspecified: Secondary | ICD-10-CM

## 2013-08-11 DIAGNOSIS — Z01419 Encounter for gynecological examination (general) (routine) without abnormal findings: Secondary | ICD-10-CM

## 2013-08-11 DIAGNOSIS — Z124 Encounter for screening for malignant neoplasm of cervix: Secondary | ICD-10-CM | POA: Insufficient documentation

## 2013-08-11 DIAGNOSIS — M8580 Other specified disorders of bone density and structure, unspecified site: Secondary | ICD-10-CM

## 2013-08-11 DIAGNOSIS — M899 Disorder of bone, unspecified: Secondary | ICD-10-CM

## 2013-08-11 DIAGNOSIS — N952 Postmenopausal atrophic vaginitis: Secondary | ICD-10-CM

## 2013-08-11 NOTE — Progress Notes (Signed)
Carrie Cox 1946/01/01 [id number]        67 y.o.  G1P1001 for annual exam.  Several issues noted below.  Past medical history,surgical history, medications, allergies, family history and social history were all reviewed and documented in the EPIC chart.  ROS:  Performed and pertinent positives and negatives are included in the history, assessment and plan .  Exam: Kim assistant Filed Vitals:   08/11/13 0907  BP: 120/78  Height: 5\' 4"  (1.626 m)  Weight: 140 lb (63.504 kg)   General appearance  Normal Skin grossly normal Head/Neck normal with no cervical or supraclavicular adenopathy thyroid normal Lungs  clear Cardiac RR, without RMG Abdominal  soft, nontender, without masses, organomegaly or hernia Breasts  examined lying and sitting without masses, retractions, discharge or axillary adenopathy. Pelvic  Ext/BUS/vagina  Mild cystocele with atrophic changes  Cervix  Normal with atrophic changes. Pap done  Uterus  anteverted, normal size, shape and contour, midline and mobile nontender   Adnexa  Without masses or tenderness    Anus and perineum  normal   Rectovaginal  normal sphincter tone without palpated masses or tenderness.    Assessment/Plan:  67 y.o. G15P1001 female for annual exam.   1. Atrophic vaginal changes. Patient used Vagifem 10 mcg 3 times weekly doing well wants to continue. We again discussed the absorption issue and risks of estrogen to include stroke heart attack DVT, breast cancer and endometrial stimulation. She's done no bleeding and knows to report this.  Patient currently has refills but will call when she needs more. 2. Buttocks HSV outbreaks. Patient uses Valtrex 500 mg twice a day x10 days for outbreaks. Patient currently has refills and will call when she needs more 3. Recurrent yeast infections. Patient sensitive with yeast outbreaks after antibiotic treatments. Will call if she needs Diflucan refill. 4. Mild cystocele. Asymptomatic. Stable exam.  We'll  continue to monitor. 5. Mammography 03/2013. Continue with annual mammography. SBE monthly reviewed. 6. Pap smear. Pap done today at her request. Last Pap smear 2012. No history of abnormal Pap smears before. Options to stop altogether as she is 66 versus less frequent screening reviewed and we will readdress this on an annual basis. 7. Osteopenia.  DEXA 08/2012 with T score -1.9 FRAX 9.8%/1.4%. Increase calcium vitamin D reviewed. Recheck DEXA next year if he or interval 8. Colonoscopy. Patient is up-to-date with screening 5 years ago.  Reports recommended interval at 10 years 9. Health maintenance.  No blood work done today as it's all done through her primary physician's office.  Follow up in one year, sooner if any issues.   Note: This document was prepared with digital dictation and possible smart phrase technology. Any transcriptional errors that result from this process are unintentional.   Dara Lords MD, 9:40 AM 08/11/2013

## 2013-08-11 NOTE — Addendum Note (Signed)
Addended by: Dayna Barker on: 08/11/2013 09:58 AM   Modules accepted: Orders

## 2013-08-11 NOTE — Patient Instructions (Signed)
followup in one year, sooner as needed. 

## 2013-08-17 ENCOUNTER — Ambulatory Visit (INDEPENDENT_AMBULATORY_CARE_PROVIDER_SITE_OTHER): Payer: 59

## 2013-08-17 DIAGNOSIS — J309 Allergic rhinitis, unspecified: Secondary | ICD-10-CM

## 2013-08-31 ENCOUNTER — Ambulatory Visit: Payer: 59

## 2013-09-02 ENCOUNTER — Other Ambulatory Visit: Payer: Self-pay

## 2013-09-02 ENCOUNTER — Ambulatory Visit (INDEPENDENT_AMBULATORY_CARE_PROVIDER_SITE_OTHER): Payer: 59

## 2013-09-02 DIAGNOSIS — J309 Allergic rhinitis, unspecified: Secondary | ICD-10-CM

## 2013-09-03 ENCOUNTER — Ambulatory Visit (INDEPENDENT_AMBULATORY_CARE_PROVIDER_SITE_OTHER): Payer: 59

## 2013-09-03 DIAGNOSIS — J309 Allergic rhinitis, unspecified: Secondary | ICD-10-CM

## 2013-09-26 ENCOUNTER — Other Ambulatory Visit: Payer: Self-pay | Admitting: Gynecology

## 2013-09-30 ENCOUNTER — Ambulatory Visit (INDEPENDENT_AMBULATORY_CARE_PROVIDER_SITE_OTHER): Payer: 59

## 2013-09-30 DIAGNOSIS — J309 Allergic rhinitis, unspecified: Secondary | ICD-10-CM

## 2013-10-02 ENCOUNTER — Encounter: Payer: Self-pay | Admitting: Gynecology

## 2013-10-06 ENCOUNTER — Ambulatory Visit: Payer: 59 | Admitting: Internal Medicine

## 2013-10-08 ENCOUNTER — Encounter: Payer: Self-pay | Admitting: Internal Medicine

## 2013-10-08 ENCOUNTER — Ambulatory Visit (INDEPENDENT_AMBULATORY_CARE_PROVIDER_SITE_OTHER): Payer: 59 | Admitting: Internal Medicine

## 2013-10-08 VITALS — BP 112/66 | HR 83 | Ht 64.0 in | Wt 135.0 lb

## 2013-10-08 DIAGNOSIS — J45998 Other asthma: Secondary | ICD-10-CM

## 2013-10-08 DIAGNOSIS — J301 Allergic rhinitis due to pollen: Secondary | ICD-10-CM

## 2013-10-08 DIAGNOSIS — J45909 Unspecified asthma, uncomplicated: Secondary | ICD-10-CM

## 2013-10-08 MED ORDER — ALBUTEROL SULFATE HFA 108 (90 BASE) MCG/ACT IN AERS: 2.0000 | INHALATION_SPRAY | Freq: Four times a day (QID) | RESPIRATORY_TRACT | Status: DC | PRN

## 2013-10-08 MED ORDER — FLUTICASONE PROPIONATE 50 MCG/ACT NA SUSP: 2.0000 | Freq: Every day | NASAL | Status: DC

## 2013-10-08 MED ORDER — EPINEPHRINE 0.3 MG/0.3ML IJ SOAJ: INTRAMUSCULAR | Status: DC

## 2013-10-08 MED ORDER — AZITHROMYCIN 250 MG PO TABS: ORAL_TABLET | ORAL | Status: DC

## 2013-10-08 MED ORDER — AZELASTINE HCL 0.1 % NA SOLN: 1.0000 | Freq: Two times a day (BID) | NASAL | Status: DC

## 2013-10-08 MED ORDER — MONTELUKAST SODIUM 10 MG PO TABS: 10.0000 mg | ORAL_TABLET | Freq: Every day | ORAL | Status: DC

## 2013-10-08 NOTE — Patient Instructions (Signed)
Scripts sent for Zpak and [employer] for flonase, Astelin and Singulair  We can continue allergy vaccine   Please call as needed

## 2013-10-08 NOTE — Progress Notes (Signed)
Subjective:     Patient ID: Carrie Cox, female   DOB: [birth year], 67 y.o.   MRN: [id number]  HPI  F never smoker followed by Dr Maple Hudson on asthmanex for chronic rhinitis and asthma on allergy vaccines  02/03/2012-acute w/in ov/Wert cc Pt prod cough with minimal amount of green to yellow sputum x 1 wk. Also has had increased SOB with exertion and wheeze for 1 wk.  Denies problems with chronic or recurrent cough. Can't take any codeine derivatives, can't take pred but ok with shot. Min improvement p saba  In general Sleeping ok without nocturnal  or early am exacerbation  of respiratory  c/o's or need for noct saba. Also denies any obvious fluctuation of symptoms with weather or environmental changes or other aggravating or alleviating factors except as outlined above   07/20/12- 66 yoF never smoker followed for allergic rhinitis, asthma/ allergy vaccine ACUTE VISIT: unable to sleep due to cough, congestion, sinus pressure, Right ear pain; Finished Zpak yesterday and no better. Nonproductive cough lying down. Chest feels clear but SOB so hasn't tried rescue inhaler.. No sore throat or fever.  10/16/12- 66 yoF never smoker followed for allergic rhinitis, asthma/ allergy vaccine FOLLOWS FOR: doing good and still on allergy vaccine 1:10 GH, adding dog antigen gradually. . Some daily cough and awareness of postnasal drip but overall says she is "great". Had an upper respiratory infection in September but that resolved.   10/08/13- 67 yoF never smoker followed for allergic rhinitis, asthma/ allergy vaccine FOLLOWS FOR: Continues to get allergy vaccine injections every other week and doing well; denies any flare ups at this time. Tolerated appendectomy in may with no respiratory problems. Upper respiratory infection a few weeks ago now resolved. 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 Allergy vaccine 1:10 GH every other week  Assessment:    ROS-see HPI Constitutional:   No-    weight loss, night sweats, fevers, chills, fatigue, lassitude. HEENT:   No-  headaches, difficulty swallowing, tooth/dental problems, sore throat,       No-  sneezing, itching, ear ache, +nasal congestion, post nasal drip,  CV:  No-   chest pain, orthopnea, PND, swelling in lower extremities, anasarca, dizziness, palpitations Resp:no- shortness of breath with exertion or at rest.              No-   productive cough, no- non-productive cough,  No- coughing up of blood.              No-   change in color of mucus.  No- wheezing.   Skin: No-   rash or lesions. GI:  No-   heartburn, indigestion, abdominal pain, nausea, vomiting,  GU: . MS:  No-   joint pain or swelling.   Neuro-     nothing unusual Psych:  No- change in mood or affect. No depression or anxiety.  No memory loss.  OBJ- Physical Exam General- Alert, Oriented, Affect-appropriate, Distress- none acute.  Skin- rash-none, lesions- none, excoriation- none Lymphadenopathy- none Head- atraumatic            Eyes- Gross vision intact, PERRLA, conjunctivae and secretions clear            Ears- +R canal red, also cerumen            Nose- Clear, no-Septal dev, mucus, polyps, erosion, perforation             Throat- Mallampati II , mucosa-clear , drainage- none, tonsils- atrophic Neck-  flexible , trachea midline, no stridor , thyroid nl, carotid no bruit Chest - symmetrical excursion , unlabored           Heart/CV- RRR , no murmur , no gallop  , no rub, nl s1 s2                           - JVD- none , edema- none, stasis changes- none, varices- none           Lung- clear to P&A, wheeze- none, no- cough , dullness-none, rub- none           Chest wall-  Abd-  Br/ Gen/ Rectal- Not done, not indicated Extrem- cyanosis- none, clubbing, none, atrophy- none, strength- nl Neuro- grossly intact to observation      Plan:

## 2013-10-12 ENCOUNTER — Ambulatory Visit (INDEPENDENT_AMBULATORY_CARE_PROVIDER_SITE_OTHER): Payer: 59

## 2013-10-12 DIAGNOSIS — J309 Allergic rhinitis, unspecified: Secondary | ICD-10-CM

## 2013-10-16 ENCOUNTER — Other Ambulatory Visit: Payer: Self-pay | Admitting: Internal Medicine

## 2013-10-22 ENCOUNTER — Ambulatory Visit (INDEPENDENT_AMBULATORY_CARE_PROVIDER_SITE_OTHER): Payer: 59

## 2013-10-22 DIAGNOSIS — J309 Allergic rhinitis, unspecified: Secondary | ICD-10-CM

## 2013-10-29 ENCOUNTER — Encounter: Payer: Self-pay | Admitting: Internal Medicine

## 2013-10-31 NOTE — Assessment & Plan Note (Signed)
Okay to continue allergy vaccine 1:10 GH every other week

## 2013-10-31 NOTE — Assessment & Plan Note (Signed)
Mild intermittent uncomplicated 

## 2013-11-03 ENCOUNTER — Ambulatory Visit (INDEPENDENT_AMBULATORY_CARE_PROVIDER_SITE_OTHER): Payer: 59

## 2013-11-03 DIAGNOSIS — J309 Allergic rhinitis, unspecified: Secondary | ICD-10-CM

## 2013-11-19 ENCOUNTER — Ambulatory Visit (INDEPENDENT_AMBULATORY_CARE_PROVIDER_SITE_OTHER): Payer: 59

## 2013-11-19 DIAGNOSIS — J309 Allergic rhinitis, unspecified: Secondary | ICD-10-CM

## 2013-12-01 ENCOUNTER — Ambulatory Visit: Payer: 59

## 2013-12-08 ENCOUNTER — Ambulatory Visit (INDEPENDENT_AMBULATORY_CARE_PROVIDER_SITE_OTHER): Payer: 59

## 2013-12-08 DIAGNOSIS — J309 Allergic rhinitis, unspecified: Secondary | ICD-10-CM

## 2013-12-27 ENCOUNTER — Ambulatory Visit (INDEPENDENT_AMBULATORY_CARE_PROVIDER_SITE_OTHER): Payer: 59

## 2013-12-27 DIAGNOSIS — J309 Allergic rhinitis, unspecified: Secondary | ICD-10-CM

## 2014-01-12 ENCOUNTER — Ambulatory Visit (INDEPENDENT_AMBULATORY_CARE_PROVIDER_SITE_OTHER): Payer: 59

## 2014-01-12 DIAGNOSIS — J309 Allergic rhinitis, unspecified: Secondary | ICD-10-CM

## 2014-02-04 ENCOUNTER — Ambulatory Visit (INDEPENDENT_AMBULATORY_CARE_PROVIDER_SITE_OTHER): Payer: 59

## 2014-02-04 DIAGNOSIS — J309 Allergic rhinitis, unspecified: Secondary | ICD-10-CM

## 2014-02-16 ENCOUNTER — Ambulatory Visit (INDEPENDENT_AMBULATORY_CARE_PROVIDER_SITE_OTHER): Payer: 59

## 2014-02-16 DIAGNOSIS — J309 Allergic rhinitis, unspecified: Secondary | ICD-10-CM

## 2014-03-02 ENCOUNTER — Ambulatory Visit (INDEPENDENT_AMBULATORY_CARE_PROVIDER_SITE_OTHER): Payer: 59

## 2014-03-02 DIAGNOSIS — J309 Allergic rhinitis, unspecified: Secondary | ICD-10-CM

## 2014-03-18 ENCOUNTER — Other Ambulatory Visit: Payer: Self-pay | Admitting: Internal Medicine

## 2014-03-22 ENCOUNTER — Ambulatory Visit (INDEPENDENT_AMBULATORY_CARE_PROVIDER_SITE_OTHER): Payer: 59

## 2014-03-22 DIAGNOSIS — J309 Allergic rhinitis, unspecified: Secondary | ICD-10-CM

## 2014-04-11 ENCOUNTER — Ambulatory Visit (INDEPENDENT_AMBULATORY_CARE_PROVIDER_SITE_OTHER): Payer: 59

## 2014-04-11 DIAGNOSIS — J309 Allergic rhinitis, unspecified: Secondary | ICD-10-CM

## 2014-04-15 ENCOUNTER — Encounter: Payer: Self-pay | Admitting: Gynecology

## 2014-04-20 ENCOUNTER — Ambulatory Visit (INDEPENDENT_AMBULATORY_CARE_PROVIDER_SITE_OTHER): Payer: 59

## 2014-04-20 DIAGNOSIS — J309 Allergic rhinitis, unspecified: Secondary | ICD-10-CM

## 2014-04-27 ENCOUNTER — Ambulatory Visit: Payer: 59

## 2014-04-28 ENCOUNTER — Ambulatory Visit (INDEPENDENT_AMBULATORY_CARE_PROVIDER_SITE_OTHER): Payer: 59

## 2014-04-28 DIAGNOSIS — J309 Allergic rhinitis, unspecified: Secondary | ICD-10-CM

## 2014-05-04 ENCOUNTER — Ambulatory Visit: Payer: 59

## 2014-05-11 ENCOUNTER — Ambulatory Visit (INDEPENDENT_AMBULATORY_CARE_PROVIDER_SITE_OTHER): Payer: 59

## 2014-05-11 DIAGNOSIS — J309 Allergic rhinitis, unspecified: Secondary | ICD-10-CM

## 2014-05-25 ENCOUNTER — Ambulatory Visit: Payer: 59

## 2014-05-30 ENCOUNTER — Ambulatory Visit (INDEPENDENT_AMBULATORY_CARE_PROVIDER_SITE_OTHER): Payer: 59

## 2014-05-30 DIAGNOSIS — J309 Allergic rhinitis, unspecified: Secondary | ICD-10-CM

## 2014-06-06 ENCOUNTER — Ambulatory Visit (INDEPENDENT_AMBULATORY_CARE_PROVIDER_SITE_OTHER): Payer: 59

## 2014-06-06 DIAGNOSIS — J309 Allergic rhinitis, unspecified: Secondary | ICD-10-CM

## 2014-06-07 ENCOUNTER — Encounter: Payer: Self-pay | Admitting: Internal Medicine

## 2014-06-10 ENCOUNTER — Other Ambulatory Visit: Payer: Self-pay | Admitting: Gynecology

## 2014-06-15 ENCOUNTER — Ambulatory Visit (INDEPENDENT_AMBULATORY_CARE_PROVIDER_SITE_OTHER): Payer: 59

## 2014-06-15 DIAGNOSIS — J309 Allergic rhinitis, unspecified: Secondary | ICD-10-CM

## 2014-06-29 ENCOUNTER — Ambulatory Visit (INDEPENDENT_AMBULATORY_CARE_PROVIDER_SITE_OTHER): Payer: 59

## 2014-06-29 DIAGNOSIS — J309 Allergic rhinitis, unspecified: Secondary | ICD-10-CM

## 2014-07-13 ENCOUNTER — Ambulatory Visit (INDEPENDENT_AMBULATORY_CARE_PROVIDER_SITE_OTHER): Payer: 59

## 2014-07-13 DIAGNOSIS — J309 Allergic rhinitis, unspecified: Secondary | ICD-10-CM

## 2014-07-14 ENCOUNTER — Ambulatory Visit
Admission: RE | Admit: 2014-07-14 | Discharge: 2014-07-14 | Disposition: A | Discharge status: Home / Self Care | Payer: 59 | Source: Ambulatory Visit | Attending: Family Medicine | Admitting: Family Medicine

## 2014-07-14 ENCOUNTER — Other Ambulatory Visit: Payer: Self-pay | Admitting: Family Medicine

## 2014-07-14 DIAGNOSIS — M79645 Pain in left finger(s): Secondary | ICD-10-CM

## 2014-07-27 ENCOUNTER — Ambulatory Visit (INDEPENDENT_AMBULATORY_CARE_PROVIDER_SITE_OTHER): Payer: 59

## 2014-07-27 DIAGNOSIS — J309 Allergic rhinitis, unspecified: Secondary | ICD-10-CM

## 2014-08-10 ENCOUNTER — Ambulatory Visit (INDEPENDENT_AMBULATORY_CARE_PROVIDER_SITE_OTHER): Payer: 59

## 2014-08-10 DIAGNOSIS — J309 Allergic rhinitis, unspecified: Secondary | ICD-10-CM

## 2014-08-16 ENCOUNTER — Ambulatory Visit (INDEPENDENT_AMBULATORY_CARE_PROVIDER_SITE_OTHER): Payer: 59 | Admitting: Gynecology

## 2014-08-16 ENCOUNTER — Encounter: Payer: Self-pay | Admitting: Gynecology

## 2014-08-16 ENCOUNTER — Other Ambulatory Visit (HOSPITAL_COMMUNITY)
Admission: RE | Admit: 2014-08-16 | Discharge: 2014-08-16 | Disposition: A | Discharge status: Home / Self Care | Payer: 59 | Source: Ambulatory Visit | Attending: Gynecology | Admitting: Gynecology

## 2014-08-16 VITALS — BP 120/64 | Ht 64.0 in | Wt 133.0 lb

## 2014-08-16 DIAGNOSIS — Z124 Encounter for screening for malignant neoplasm of cervix: Secondary | ICD-10-CM | POA: Diagnosis not present

## 2014-08-16 DIAGNOSIS — M8580 Other specified disorders of bone density and structure, unspecified site: Secondary | ICD-10-CM

## 2014-08-16 DIAGNOSIS — N952 Postmenopausal atrophic vaginitis: Secondary | ICD-10-CM

## 2014-08-16 DIAGNOSIS — A609 Anogenital herpesviral infection, unspecified: Secondary | ICD-10-CM

## 2014-08-16 DIAGNOSIS — Z01419 Encounter for gynecological examination (general) (routine) without abnormal findings: Secondary | ICD-10-CM

## 2014-08-16 MED ORDER — VALACYCLOVIR HCL 500 MG PO TABS: 500.0000 mg | ORAL_TABLET | Freq: Two times a day (BID) | ORAL | Status: DC

## 2014-08-16 MED ORDER — FLUCONAZOLE 150 MG PO TABS: 150.0000 mg | ORAL_TABLET | Freq: Once | ORAL | Status: DC

## 2014-08-16 NOTE — Progress Notes (Signed)
PECOLA HAXTON Oct 16, 1946 [id number]        68 y.o.  G1P1001 for annual exam.  Several issues noted below.  Past medical history,surgical history, problem list, medications, allergies, family history and social history were all reviewed and documented as reviewed in the EPIC chart.  ROS:  12 system ROS performed with pertinent positives and negatives included in the history, assessment and plan.   Additional significant findings :  none   Exam: Kim Counsellor Vitals:   08/16/14 0826  BP: 120/64  Height: 5\' 4"  (1.626 m)  Weight: 133 lb (60.328 kg)   General appearance:  Normal affect, orientation and appearance. Skin: Grossly normal HEENT: Without gross lesions.  No cervical or supraclavicular adenopathy. Thyroid normal.  Lungs:  Clear without wheezing, rales or rhonchi Cardiac: RR, without RMG Abdominal:  Soft, nontender, without masses, guarding, rebound, organomegaly or hernia Breasts:  Examined lying and sitting without masses, retractions, discharge or axillary adenopathy. Pelvic:  Ext/BUS/vagina with generalized atrophic changes. Mild cystocele noted  Cervix with atrophic changes.  Done  Uterus anteverted, normal size, shape and contour, midline and mobile nontender   Adnexa  Without masses or tenderness    Anus and perineum  Normal   Rectovaginal  Normal sphincter tone without palpated masses or tenderness.    Assessment/Plan:  68 y.o. G1P1001 female for annual exam .   1. Postmenopausal/atrophic genital changes.  Patient continues using Vagifem 10 mcg 3 times weekly with good results. We've discussed the risks of absorption including stroke heart attack DVT breast cancer and endometrial stimulation. She has done no vaginal bleeding. Not having significant hot flashes or night sweats. She wants to continue and I refilled her x1 year. She extra just put in a reorder that her mail order and she will call whenever she needs a refill to be sent through. 2. Buttocks HSV  intermittent outbreaks. Uses Valtrex 500 mg twice a day x1 week. #30 with 3 refills provided as a written prescription to have available when needed. 3. Mild cystocele. Stable on exam. Patient asymptomatic. We'll continue to observe. 4. Osteopenia. DEXA 2013 T score -1.9 FRAX 9.8%/1.4%. Repeat DEXA now a 2 year interval and she will schedule. Increased calcium vitamin D reviewed. 5. Pap smear 2014 normal. She requests annual cytology recognizing the newer recommendations for spacing out the intervals. Pap smear done today. 6. Colonoscopy 6 years ago. Reported repeat interval 10 years. 7. Recurrent yeast infections. Patient has recurrent infections throughout the year that she uses Diflucan 150 mg when necessary. #10 provided for the year. 8. Mammography 03/2014. Continue with annual mammography. SBE monthly reviewed. 9. Health maintenance. No routine blood work done and she reports this done at her primary physician's office. Follow up one year, sooner as needed.     Anastasio Auerbach MD, 8:51 AM 08/16/2014

## 2014-08-16 NOTE — Addendum Note (Signed)
Addended by: Nelva Nay on: 08/16/2014 09:13 AM   Modules accepted: Orders

## 2014-08-16 NOTE — Patient Instructions (Signed)
Follow up for bone density as scheduled.  You may obtain a copy of any labs that were done today by logging onto MyChart as outlined in the instructions provided with your AVS (after visit summary). The office will not call with normal lab results but certainly if there are any significant abnormalities then we will contact you.   Health Maintenance, Female A healthy lifestyle and preventative care can promote health and wellness.  Maintain regular health, dental, and eye exams.  Eat a healthy diet. Foods like vegetables, fruits, whole grains, low-fat dairy products, and lean protein foods contain the nutrients you need without too many calories. Decrease your intake of foods high in solid fats, added sugars, and salt. Get information about a proper diet from your caregiver, if necessary.  Regular physical exercise is one of the most important things you can do for your health. Most adults should get at least 150 minutes of moderate-intensity exercise (any activity that increases your heart rate and causes you to sweat) each week. In addition, most adults need muscle-strengthening exercises on 2 or more days a week.   Maintain a healthy weight. The body mass index (BMI) is a screening tool to identify possible weight problems. It provides an estimate of body fat based on height and weight. Your caregiver can help determine your BMI, and can help you achieve or maintain a healthy weight. For adults 20 years and older:  A BMI below 18.5 is considered underweight.  A BMI of 18.5 to 24.9 is normal.  A BMI of 25 to 29.9 is considered overweight.  A BMI of 30 and above is considered obese.  Maintain normal blood lipids and cholesterol by exercising and minimizing your intake of saturated fat. Eat a balanced diet with plenty of fruits and vegetables. Blood tests for lipids and cholesterol should begin at age 20 and be repeated every 5 years. If your lipid or cholesterol levels are high, you are over  50, or you are a high risk for heart disease, you may need your cholesterol levels checked more frequently.Ongoing high lipid and cholesterol levels should be treated with medicines if diet and exercise are not effective.  If you smoke, find out from your caregiver how to quit. If you do not use tobacco, do not start.  Lung cancer screening is recommended for adults aged 55 80 years who are at high risk for developing lung cancer because of a history of smoking. Yearly low-dose computed tomography (CT) is recommended for people who have at least a 30-pack-year history of smoking and are a current smoker or have quit within the past 15 years. A pack year of smoking is smoking an average of 1 pack of cigarettes a day for 1 year (for example: 1 pack a day for 30 years or 2 packs a day for 15 years). Yearly screening should continue until the smoker has stopped smoking for at least 15 years. Yearly screening should also be stopped for people who develop a health problem that would prevent them from having lung cancer treatment.  If you are pregnant, do not drink alcohol. If you are breastfeeding, be very cautious about drinking alcohol. If you are not pregnant and choose to drink alcohol, do not exceed 1 drink per day. One drink is considered to be 12 ounces (355 mL) of beer, 5 ounces (148 mL) of wine, or 1.5 ounces (44 mL) of liquor.  Avoid use of street drugs. Do not share needles with anyone. Ask for help   if you need support or instructions about stopping the use of drugs.  High blood pressure causes heart disease and increases the risk of stroke. Blood pressure should be checked at least every 1 to 2 years. Ongoing high blood pressure should be treated with medicines, if weight loss and exercise are not effective.  If you are 55 to 67 years old, ask your caregiver if you should take aspirin to prevent strokes.  Diabetes screening involves taking a blood sample to check your fasting blood sugar level.  This should be done once every 3 years, after age 45, if you are within normal weight and without risk factors for diabetes. Testing should be considered at a younger age or be carried out more frequently if you are overweight and have at least 1 risk factor for diabetes.  Breast cancer screening is essential preventative care for women. You should practice "breast self-awareness." This means understanding the normal appearance and feel of your breasts and may include breast self-examination. Any changes detected, no matter how small, should be reported to a caregiver. Women in their 20s and 30s should have a clinical breast exam (CBE) by a caregiver as part of a regular health exam every 1 to 3 years. After age 40, women should have a CBE every year. Starting at age 40, women should consider having a mammogram (breast X-ray) every year. Women who have a family history of breast cancer should talk to their caregiver about genetic screening. Women at a high risk of breast cancer should talk to their caregiver about having an MRI and a mammogram every year.  Breast cancer gene (BRCA)-related cancer risk assessment is recommended for women who have family members with BRCA-related cancers. BRCA-related cancers include breast, ovarian, tubal, and peritoneal cancers. Having family members with these cancers may be associated with an increased risk for harmful changes (mutations) in the breast cancer genes BRCA1 and BRCA2. Results of the assessment will determine the need for genetic counseling and BRCA1 and BRCA2 testing.  The Pap test is a screening test for cervical cancer. Women should have a Pap test starting at age 21. Between ages 21 and 29, Pap tests should be repeated every 2 years. Beginning at age 30, you should have a Pap test every 3 years as long as the past 3 Pap tests have been normal. If you had a hysterectomy for a problem that was not cancer or a condition that could lead to cancer, then you no  longer need Pap tests. If you are between ages 65 and 70, and you have had normal Pap tests going back 10 years, you no longer need Pap tests. If you have had past treatment for cervical cancer or a condition that could lead to cancer, you need Pap tests and screening for cancer for at least 20 years after your treatment. If Pap tests have been discontinued, risk factors (such as a new sexual partner) need to be reassessed to determine if screening should be resumed. Some women have medical problems that increase the chance of getting cervical cancer. In these cases, your caregiver may recommend more frequent screening and Pap tests.  The human papillomavirus (HPV) test is an additional test that may be used for cervical cancer screening. The HPV test looks for the virus that can cause the cell changes on the cervix. The cells collected during the Pap test can be tested for HPV. The HPV test could be used to screen women aged 30 years and older, and   should be used in women of any age who have unclear Pap test results. After the age of 30, women should have HPV testing at the same frequency as a Pap test.  Colorectal cancer can be detected and often prevented. Most routine colorectal cancer screening begins at the age of 50 and continues through age 75. However, your caregiver may recommend screening at an earlier age if you have risk factors for colon cancer. On a yearly basis, your caregiver may provide home test kits to check for hidden blood in the stool. Use of a small camera at the end of a tube, to directly examine the colon (sigmoidoscopy or colonoscopy), can detect the earliest forms of colorectal cancer. Talk to your caregiver about this at age 50, when routine screening begins. Direct examination of the colon should be repeated every 5 to 10 years through age 75, unless early forms of pre-cancerous polyps or small growths are found.  Hepatitis C blood testing is recommended for all people born from  1945 through 1965 and any individual with known risks for hepatitis C.  Practice safe sex. Use condoms and avoid high-risk sexual practices to reduce the spread of sexually transmitted infections (STIs). Sexually active women aged 25 and younger should be checked for Chlamydia, which is a common sexually transmitted infection. Older women with new or multiple partners should also be tested for Chlamydia. Testing for other STIs is recommended if you are sexually active and at increased risk.  Osteoporosis is a disease in which the bones lose minerals and strength with aging. This can result in serious bone fractures. The risk of osteoporosis can be identified using a bone density scan. Women ages 65 and over and women at risk for fractures or osteoporosis should discuss screening with their caregivers. Ask your caregiver whether you should be taking a calcium supplement or vitamin D to reduce the rate of osteoporosis.  Menopause can be associated with physical symptoms and risks. Hormone replacement therapy is available to decrease symptoms and risks. You should talk to your caregiver about whether hormone replacement therapy is right for you.  Use sunscreen. Apply sunscreen liberally and repeatedly throughout the day. You should seek shade when your shadow is shorter than you. Protect yourself by wearing long sleeves, pants, a wide-brimmed hat, and sunglasses year round, whenever you are outdoors.  Notify your caregiver of new moles or changes in moles, especially if there is a change in shape or color. Also notify your caregiver if a mole is larger than the size of a pencil eraser.  Stay current with your immunizations. Document Released: 04/29/2011 Document Revised: 02/08/2013 Document Reviewed: 04/29/2011 [employer] Patient Information 2014 [employer].   

## 2014-08-17 LAB — URINALYSIS W MICROSCOPIC + REFLEX CULTURE
BACTERIA UA: NONE SEEN
Bilirubin Urine: NEGATIVE
CASTS: NONE SEEN
Crystals: NONE SEEN
Glucose, UA: NEGATIVE mg/dL
HGB URINE DIPSTICK: NEGATIVE
KETONES UR: NEGATIVE mg/dL
LEUKOCYTES UA: NEGATIVE
NITRITE: NEGATIVE
PROTEIN: NEGATIVE mg/dL
Specific Gravity, Urine: 1.014 (ref 1.005–1.030)
Urobilinogen, UA: 0.2 mg/dL (ref 0.0–1.0)
pH: 5.5 (ref 5.0–8.0)

## 2014-08-17 LAB — CYTOLOGY - PAP

## 2014-08-24 ENCOUNTER — Ambulatory Visit (INDEPENDENT_AMBULATORY_CARE_PROVIDER_SITE_OTHER): Payer: 59

## 2014-08-24 DIAGNOSIS — J309 Allergic rhinitis, unspecified: Secondary | ICD-10-CM

## 2014-08-28 DIAGNOSIS — M8580 Other specified disorders of bone density and structure, unspecified site: Secondary | ICD-10-CM

## 2014-08-28 HISTORY — DX: Other specified disorders of bone density and structure, unspecified site: M85.80

## 2014-08-29 ENCOUNTER — Encounter: Payer: Self-pay | Admitting: Gynecology

## 2014-09-05 ENCOUNTER — Encounter: Payer: Self-pay | Admitting: Internal Medicine

## 2014-09-06 ENCOUNTER — Telehealth: Payer: Self-pay | Admitting: *Deleted

## 2014-09-06 ENCOUNTER — Ambulatory Visit (INDEPENDENT_AMBULATORY_CARE_PROVIDER_SITE_OTHER): Payer: 59

## 2014-09-06 ENCOUNTER — Encounter: Payer: Self-pay | Admitting: Gynecology

## 2014-09-06 DIAGNOSIS — M8580 Other specified disorders of bone density and structure, unspecified site: Secondary | ICD-10-CM

## 2014-09-06 NOTE — Telephone Encounter (Signed)
I'm not sure if she is referring to vitamin D. Vitamin D supplement can be anywhere from [phone] units daily. From a vitamin E standpoint there are no recommendations in reference to her bones.

## 2014-09-06 NOTE — Telephone Encounter (Signed)
Pt had Dexa done today and spoke to [facility] about her taking Vitamin E 500 mg daily. Pt said she was told to inform you with this information, whether this is too much? Etc. Please advise

## 2014-09-06 NOTE — Telephone Encounter (Signed)
Pt is going to call back and check the bottles to see correct dose

## 2014-09-07 ENCOUNTER — Ambulatory Visit (INDEPENDENT_AMBULATORY_CARE_PROVIDER_SITE_OTHER): Payer: 59

## 2014-09-07 DIAGNOSIS — J309 Allergic rhinitis, unspecified: Secondary | ICD-10-CM

## 2014-09-07 NOTE — Telephone Encounter (Signed)
Dr. Phineas Real Patient said she is taking Vitamin D 5,000 units per day, pt had last Vit. D level checked in 2011 and it was 49. Should have rechecked? Continue 5,0000 units? Please advise

## 2014-09-07 NOTE — Telephone Encounter (Signed)
I would definitely have it rechecked. I thought that her primary is following that she can come by here for a vitamin D level.

## 2014-09-08 NOTE — Telephone Encounter (Signed)
Pt informed with the below note. 

## 2014-09-12 ENCOUNTER — Telehealth: Payer: Self-pay | Admitting: *Deleted

## 2014-09-12 NOTE — Telephone Encounter (Signed)
Pt informed with the below note. 

## 2014-09-12 NOTE — Telephone Encounter (Signed)
Pt has 2 questions regarding dexa results.  1. What is her exact T-score of dexa?  2. What does bone mineral density stable mixed pattern mean?

## 2014-09-12 NOTE — Telephone Encounter (Signed)
T score -1.4 with 6% decline at the spine, 8% decrease at the right hip, 6% increase left hip. She shows a mixed pattern where some decrease and some increase. Both hip measurements are still within the normal range. The spine measurement is the -1.4 T score. Her 10 year theoretical fracture risk is 8% overall and 0.8% hip. It is recommended the patients consider treatment if the overall risk exceeds 20% and the hip risk exceeds 3%. Based upon all of this I would recommend maximizing calcium and vitamin D increasing weightbearing exercise to stress the back and hips which will improve her bone density and repeat the bone density in 2 years.

## 2014-09-15 ENCOUNTER — Other Ambulatory Visit: Payer: Self-pay | Admitting: Internal Medicine

## 2014-09-20 ENCOUNTER — Ambulatory Visit (INDEPENDENT_AMBULATORY_CARE_PROVIDER_SITE_OTHER): Payer: 59

## 2014-09-20 DIAGNOSIS — J309 Allergic rhinitis, unspecified: Secondary | ICD-10-CM

## 2014-09-27 ENCOUNTER — Ambulatory Visit (INDEPENDENT_AMBULATORY_CARE_PROVIDER_SITE_OTHER): Payer: 59 | Admitting: Internal Medicine

## 2014-09-27 ENCOUNTER — Encounter: Payer: Self-pay | Admitting: Internal Medicine

## 2014-09-27 VITALS — BP 124/78 | HR 87 | Ht 64.0 in | Wt 137.2 lb

## 2014-09-27 DIAGNOSIS — J45998 Other asthma: Secondary | ICD-10-CM

## 2014-09-27 DIAGNOSIS — J301 Allergic rhinitis due to pollen: Secondary | ICD-10-CM

## 2014-09-27 MED ORDER — MONTELUKAST SODIUM 10 MG PO TABS: ORAL_TABLET | ORAL | Status: DC

## 2014-09-27 MED ORDER — AZELASTINE HCL 0.1 % NA SOLN: 1.0000 | Freq: Two times a day (BID) | NASAL | Status: DC

## 2014-09-27 MED ORDER — ALBUTEROL SULFATE HFA 108 (90 BASE) MCG/ACT IN AERS: 2.0000 | INHALATION_SPRAY | Freq: Four times a day (QID) | RESPIRATORY_TRACT | Status: DC | PRN

## 2014-09-27 MED ORDER — AZITHROMYCIN 250 MG PO TABS: ORAL_TABLET | ORAL | Status: DC

## 2014-09-27 MED ORDER — FLUTICASONE PROPIONATE 50 MCG/ACT NA SUSP: NASAL | Status: DC

## 2014-09-27 MED ORDER — EPINEPHRINE 0.3 MG/0.3ML IJ SOAJ: INTRAMUSCULAR | Status: DC

## 2014-09-27 NOTE — Progress Notes (Signed)
Subjective:     Patient ID: Carrie Cox, female   DOB: 02/15/1946, 68 y.o.   MRN: [id number]  HPI  F never smoker followed by Dr Annamaria Boots on asthmanex for chronic rhinitis and asthma on allergy vaccines  02/03/2012-acute w/in ov/Wert cc Pt prod cough with minimal amount of green to yellow sputum x 1 wk. Also has had increased SOB with exertion and wheeze for 1 wk.  Denies problems with chronic or recurrent cough. Can't take any codeine derivatives, can't take pred but ok with shot. Min improvement p saba  In general Sleeping ok without nocturnal  or early am exacerbation  of respiratory  c/o's or need for noct saba. Also denies any obvious fluctuation of symptoms with weather or environmental changes or other aggravating or alleviating factors except as outlined above   07/20/12- 66 yoF never smoker followed for allergic rhinitis, asthma/ allergy vaccine ACUTE VISIT: unable to sleep due to cough, congestion, sinus pressure, Right ear pain; Finished Zpak yesterday and no better. Nonproductive cough lying down. Chest feels clear but SOB so hasn't tried rescue inhaler.. No sore throat or fever.  10/16/12- 32 yoF never smoker followed for allergic rhinitis, asthma/ allergy vaccine FOLLOWS FOR: doing good and still on allergy vaccine 1:10 GH, adding dog antigen gradually. . Some daily cough and awareness of postnasal drip but overall says she is "great". Had an upper respiratory infection in September but that resolved.   10/08/13- 67 yoF never smoker followed for allergic rhinitis, asthma/ allergy vaccine FOLLOWS FOR: Continues to get allergy vaccine injections every other week and doing well; denies any flare ups at this time. Tolerated appendectomy in may with no respiratory problems. Upper respiratory infection a few weeks ago now resolved. 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 We reviewed medications and her status through recent weather changes. She would like to hold prescription for Z-Pak this winter-discussed  ROS-see HPI Constitutional:   No-   weight loss, night sweats, fevers, chills, fatigue, lassitude. HEENT:   No-  headaches, difficulty swallowing, tooth/dental problems, sore throat,       No-  sneezing, itching, ear ache, nasal congestion, post nasal drip,  CV:  No-   chest pain, orthopnea, PND, swelling in lower extremities, anasarca,                                  dizziness, palpitations Resp: No-   shortness of breath with exertion or at rest.              No-   productive cough,  No non-productive cough,  No- coughing up of blood.              No-   change in color of mucus.  No- wheezing.   Skin: No-   rash or lesions. GI:  No-   heartburn, indigestion, abdominal pain, nausea, vomiting, diarrhea,                 change in bowel habits, loss of appetite GU: No-   dysuria, change in color of urine, no urgency or frequency.  No- flank pain. MS:  No-   joint pain or swelling.  No- decreased range of motion.  No- back pain.  Neuro-     nothing unusual Psych:  No- change in mood or affect. No depression or anxiety.  No memory loss.  OBJ- Physical Exam General- Alert, Oriented, Affect-appropriate, Distress- none acute Skin- rash-none, lesions- none, excoriation- none Lymphadenopathy- none Head- atraumatic            Eyes- Gross vision intact, PERRLA, conjunctivae and secretions clear            Ears- Hearing, canals-normal            Nose- Clear, no-Septal dev, mucus, polyps, erosion, perforation             Throat- Mallampati II , mucosa clear , drainage- none, tonsils- atrophic Neck- flexible , trachea midline, no stridor , thyroid nl, carotid no bruit Chest - symmetrical excursion , unlabored           Heart/CV- RRR , no murmur , no gallop  , no rub,  nl s1 s2                           - JVD- none , edema- none, stasis changes- none, varices- none           Lung- clear to P&A, wheeze- none, cough- none , dullness-none, rub- none           Chest wall-  Abd-  Br/ Gen/ Rectal- Not done, not indicated Extrem- cyanosis- none, clubbing, none, atrophy- none, strength- nl Neuro- grossly intact to observation   Assessment:         Plan:

## 2014-09-27 NOTE — Patient Instructions (Addendum)
Scripts refilled  We can continue allergy vaccine 1:10 GH  Please call as needed

## 2014-10-05 ENCOUNTER — Ambulatory Visit (INDEPENDENT_AMBULATORY_CARE_PROVIDER_SITE_OTHER): Payer: 59

## 2014-10-05 DIAGNOSIS — J309 Allergic rhinitis, unspecified: Secondary | ICD-10-CM

## 2014-10-12 ENCOUNTER — Ambulatory Visit: Payer: 59 | Admitting: Internal Medicine

## 2014-10-19 ENCOUNTER — Ambulatory Visit (INDEPENDENT_AMBULATORY_CARE_PROVIDER_SITE_OTHER): Payer: 59

## 2014-10-19 DIAGNOSIS — J309 Allergic rhinitis, unspecified: Secondary | ICD-10-CM

## 2014-10-23 NOTE — Assessment & Plan Note (Signed)
Good current control. Medication review done and refills discussed.

## 2014-10-23 NOTE — Assessment & Plan Note (Signed)
We discussed goals and risk benefit/alternative to allergy vaccine. She chooses to continue for now.

## 2014-10-25 ENCOUNTER — Ambulatory Visit (INDEPENDENT_AMBULATORY_CARE_PROVIDER_SITE_OTHER): Payer: 59

## 2014-10-25 DIAGNOSIS — J309 Allergic rhinitis, unspecified: Secondary | ICD-10-CM

## 2014-11-09 ENCOUNTER — Ambulatory Visit (INDEPENDENT_AMBULATORY_CARE_PROVIDER_SITE_OTHER): Payer: 59

## 2014-11-09 DIAGNOSIS — J309 Allergic rhinitis, unspecified: Secondary | ICD-10-CM

## 2014-11-23 ENCOUNTER — Ambulatory Visit (INDEPENDENT_AMBULATORY_CARE_PROVIDER_SITE_OTHER): Payer: 59

## 2014-11-23 DIAGNOSIS — J309 Allergic rhinitis, unspecified: Secondary | ICD-10-CM

## 2014-12-06 ENCOUNTER — Encounter: Payer: Self-pay | Admitting: Internal Medicine

## 2014-12-07 ENCOUNTER — Ambulatory Visit (INDEPENDENT_AMBULATORY_CARE_PROVIDER_SITE_OTHER): Payer: 59

## 2014-12-07 DIAGNOSIS — J309 Allergic rhinitis, unspecified: Secondary | ICD-10-CM

## 2014-12-16 ENCOUNTER — Telehealth: Payer: Self-pay | Admitting: *Deleted

## 2014-12-16 MED ORDER — ESTRADIOL 10 MCG VA TABS: ORAL_TABLET | VAGINAL | Status: DC

## 2014-12-16 NOTE — Telephone Encounter (Signed)
Pt called requesting vagifem 10 mcg Rx [facility]. rx sent

## 2014-12-21 ENCOUNTER — Ambulatory Visit (INDEPENDENT_AMBULATORY_CARE_PROVIDER_SITE_OTHER): Payer: 59

## 2014-12-21 DIAGNOSIS — J309 Allergic rhinitis, unspecified: Secondary | ICD-10-CM

## 2015-01-11 ENCOUNTER — Ambulatory Visit (INDEPENDENT_AMBULATORY_CARE_PROVIDER_SITE_OTHER): Payer: 59

## 2015-01-11 DIAGNOSIS — J309 Allergic rhinitis, unspecified: Secondary | ICD-10-CM

## 2015-01-25 ENCOUNTER — Ambulatory Visit (INDEPENDENT_AMBULATORY_CARE_PROVIDER_SITE_OTHER): Payer: 59

## 2015-01-25 ENCOUNTER — Telehealth: Payer: Self-pay

## 2015-01-25 DIAGNOSIS — J309 Allergic rhinitis, unspecified: Secondary | ICD-10-CM | POA: Diagnosis not present

## 2015-01-25 NOTE — Telephone Encounter (Signed)
lmtcb on home and work # to make pt aware that allergy lab is closing at 5 today, and that pt will need to arrive before 5:00 to receive her allergy injection.

## 2015-02-06 ENCOUNTER — Ambulatory Visit (INDEPENDENT_AMBULATORY_CARE_PROVIDER_SITE_OTHER): Payer: 59

## 2015-02-06 DIAGNOSIS — J309 Allergic rhinitis, unspecified: Secondary | ICD-10-CM | POA: Diagnosis not present

## 2015-02-08 ENCOUNTER — Ambulatory Visit (INDEPENDENT_AMBULATORY_CARE_PROVIDER_SITE_OTHER): Payer: 59

## 2015-02-08 DIAGNOSIS — J309 Allergic rhinitis, unspecified: Secondary | ICD-10-CM

## 2015-02-22 ENCOUNTER — Ambulatory Visit (INDEPENDENT_AMBULATORY_CARE_PROVIDER_SITE_OTHER): Payer: 59

## 2015-02-22 DIAGNOSIS — J309 Allergic rhinitis, unspecified: Secondary | ICD-10-CM

## 2015-03-08 ENCOUNTER — Ambulatory Visit: Payer: 59

## 2015-03-15 ENCOUNTER — Ambulatory Visit (INDEPENDENT_AMBULATORY_CARE_PROVIDER_SITE_OTHER): Payer: 59

## 2015-03-15 DIAGNOSIS — J309 Allergic rhinitis, unspecified: Secondary | ICD-10-CM | POA: Diagnosis not present

## 2015-03-29 ENCOUNTER — Ambulatory Visit (INDEPENDENT_AMBULATORY_CARE_PROVIDER_SITE_OTHER): Payer: 59

## 2015-03-29 DIAGNOSIS — J309 Allergic rhinitis, unspecified: Secondary | ICD-10-CM | POA: Diagnosis not present

## 2015-04-12 ENCOUNTER — Ambulatory Visit (INDEPENDENT_AMBULATORY_CARE_PROVIDER_SITE_OTHER): Payer: 59

## 2015-04-12 DIAGNOSIS — J309 Allergic rhinitis, unspecified: Secondary | ICD-10-CM | POA: Diagnosis not present

## 2015-04-18 ENCOUNTER — Encounter: Payer: Self-pay | Admitting: Gynecology

## 2015-04-26 ENCOUNTER — Ambulatory Visit (INDEPENDENT_AMBULATORY_CARE_PROVIDER_SITE_OTHER): Payer: 59

## 2015-04-26 DIAGNOSIS — J309 Allergic rhinitis, unspecified: Secondary | ICD-10-CM

## 2015-05-03 ENCOUNTER — Ambulatory Visit (INDEPENDENT_AMBULATORY_CARE_PROVIDER_SITE_OTHER): Payer: 59

## 2015-05-03 DIAGNOSIS — J309 Allergic rhinitis, unspecified: Secondary | ICD-10-CM | POA: Diagnosis not present

## 2015-05-17 ENCOUNTER — Ambulatory Visit (INDEPENDENT_AMBULATORY_CARE_PROVIDER_SITE_OTHER): Payer: 59

## 2015-05-17 DIAGNOSIS — J309 Allergic rhinitis, unspecified: Secondary | ICD-10-CM | POA: Diagnosis not present

## 2015-05-19 ENCOUNTER — Encounter: Payer: Self-pay | Admitting: Internal Medicine

## 2015-05-31 ENCOUNTER — Ambulatory Visit (INDEPENDENT_AMBULATORY_CARE_PROVIDER_SITE_OTHER): Payer: 59

## 2015-05-31 DIAGNOSIS — J309 Allergic rhinitis, unspecified: Secondary | ICD-10-CM

## 2015-06-13 ENCOUNTER — Telehealth: Payer: Self-pay | Admitting: Internal Medicine

## 2015-06-13 MED ORDER — CEFDINIR 300 MG PO CAPS: 300.0000 mg | ORAL_CAPSULE | Freq: Two times a day (BID) | ORAL | Status: DC

## 2015-06-13 NOTE — Telephone Encounter (Signed)
Called spoke with pt. She reports a sore in the right side nostril that won't heal x few weeks now. She has allergies and so it's painful. She has tried sporins and using saline but not helping. She wants to know if Dr. Annamaria Boots can call something in for her. Please advise thanks  Allergies  Allergen Reactions  . 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 Shellfish Allergy Anaphylaxis  . Codeine   . Doxycycline     Stomach pain  . Erythromycin      Current Outpatient Prescriptions on File Prior to Visit  Medication Sig Dispense Refill  . albuterol (PROVENTIL HFA;VENTOLIN HFA) 108 (90 BASE) MCG/ACT inhaler Inhale 2 puffs into the lungs every 6 (six) hours as needed for wheezing. 1 Inhaler prn  . ALPRAZolam (XANAX) 0.25 MG tablet Take 0.25 mg by mouth at bedtime as needed for anxiety.     Marland Kitchen azelastine (ASTELIN) 0.1 % nasal spray Place 1 spray into both nostrils 2 (two) times daily. Use in each nostril as directed 90 mL 3  . azithromycin (ZITHROMAX Z-PAK) 250 MG tablet 2 today then one daily 6 each 2  . Calcium Carbonate (CALCIUM 500 PO) Take 1 tablet by mouth daily.     . carisoprodol (SOMA) 350 MG tablet Take 350 mg by mouth 4 (four) times daily as needed for muscle spasms.    . cholecalciferol (VITAMIN D) 1000 UNITS tablet Take 1,000 Units by mouth daily.      Marland Kitchen EPINEPHrine (EPIPEN 2-PAK) 0.3 mg/0.3 mL IJ SOAJ injection Inject into thigh for severe allergic reaction 1 Device prn  . escitalopram (LEXAPRO) 10 MG tablet Take 10 mg by mouth daily.     . Estradiol (VAGIFEM) 10 MCG TABS vaginal tablet Insert 1 tablet vaginally 3 times weekly 48 tablet 3  . fluticasone (FLONASE) 50 MCG/ACT nasal spray 1-2 puffs each nostril once daily as needed 48 g 3  . montelukast (SINGULAIR) 10 MG tablet 1 daily 90 tablet 3  . valACYclovir (VALTREX) 500 MG tablet Take 1 tablet (500 mg total) by mouth 2 (two) times daily. As needed for outbreak 30 tablet 3   No current facility-administered  medications on file prior to visit.

## 2015-06-13 NOTE — Telephone Encounter (Signed)
There may be a little carbuncle or infected hair follicle.  Suggest antibiotic cefdinir 300 mg, # 14, 1 twice daily. If not getting better, then will need to see ENT.

## 2015-06-13 NOTE — Telephone Encounter (Signed)
Spoke with patient- she is aware of CY's recs and requests Rx [facility] [address]. Nothing more needed at this time.

## 2015-06-13 NOTE — Telephone Encounter (Signed)
lmomtcb x1 

## 2015-06-14 ENCOUNTER — Ambulatory Visit (INDEPENDENT_AMBULATORY_CARE_PROVIDER_SITE_OTHER): Payer: 59

## 2015-06-14 DIAGNOSIS — J309 Allergic rhinitis, unspecified: Secondary | ICD-10-CM | POA: Diagnosis not present

## 2015-06-22 ENCOUNTER — Encounter: Payer: Self-pay | Admitting: Internal Medicine

## 2015-06-28 ENCOUNTER — Ambulatory Visit (INDEPENDENT_AMBULATORY_CARE_PROVIDER_SITE_OTHER): Payer: 59

## 2015-06-28 DIAGNOSIS — J309 Allergic rhinitis, unspecified: Secondary | ICD-10-CM | POA: Diagnosis not present

## 2015-07-12 ENCOUNTER — Ambulatory Visit (INDEPENDENT_AMBULATORY_CARE_PROVIDER_SITE_OTHER): Payer: 59

## 2015-07-12 DIAGNOSIS — J309 Allergic rhinitis, unspecified: Secondary | ICD-10-CM

## 2015-07-12 DIAGNOSIS — Z23 Encounter for immunization: Secondary | ICD-10-CM | POA: Diagnosis not present

## 2015-07-26 ENCOUNTER — Ambulatory Visit (INDEPENDENT_AMBULATORY_CARE_PROVIDER_SITE_OTHER): Payer: 59

## 2015-07-26 DIAGNOSIS — J309 Allergic rhinitis, unspecified: Secondary | ICD-10-CM

## 2015-08-09 ENCOUNTER — Ambulatory Visit (INDEPENDENT_AMBULATORY_CARE_PROVIDER_SITE_OTHER): Payer: 59

## 2015-08-09 DIAGNOSIS — J309 Allergic rhinitis, unspecified: Secondary | ICD-10-CM

## 2015-08-23 ENCOUNTER — Encounter: Payer: Self-pay | Admitting: Gynecology

## 2015-08-23 ENCOUNTER — Ambulatory Visit (INDEPENDENT_AMBULATORY_CARE_PROVIDER_SITE_OTHER): Payer: 59 | Admitting: Gynecology

## 2015-08-23 ENCOUNTER — Ambulatory Visit (INDEPENDENT_AMBULATORY_CARE_PROVIDER_SITE_OTHER): Payer: 59

## 2015-08-23 ENCOUNTER — Other Ambulatory Visit (HOSPITAL_COMMUNITY)
Admission: RE | Admit: 2015-08-23 | Discharge: 2015-08-23 | Disposition: A | Discharge status: Home / Self Care | Payer: 59 | Source: Ambulatory Visit | Attending: Gynecology | Admitting: Gynecology

## 2015-08-23 VITALS — BP 144/90 | Ht 64.0 in | Wt 135.0 lb

## 2015-08-23 DIAGNOSIS — N952 Postmenopausal atrophic vaginitis: Secondary | ICD-10-CM

## 2015-08-23 DIAGNOSIS — J309 Allergic rhinitis, unspecified: Secondary | ICD-10-CM | POA: Diagnosis not present

## 2015-08-23 DIAGNOSIS — Z01419 Encounter for gynecological examination (general) (routine) without abnormal findings: Secondary | ICD-10-CM

## 2015-08-23 DIAGNOSIS — Z124 Encounter for screening for malignant neoplasm of cervix: Secondary | ICD-10-CM | POA: Insufficient documentation

## 2015-08-23 MED ORDER — FLUCONAZOLE 150 MG PO TABS: 150.0000 mg | ORAL_TABLET | Freq: Once | ORAL | Status: DC

## 2015-08-23 MED ORDER — VALACYCLOVIR HCL 500 MG PO TABS: 500.0000 mg | ORAL_TABLET | Freq: Two times a day (BID) | ORAL | Status: DC

## 2015-08-23 MED ORDER — ESTRADIOL 10 MCG VA TABS: ORAL_TABLET | VAGINAL | Status: DC

## 2015-08-23 NOTE — Progress Notes (Signed)
Carrie Cox April 05, 1946 [id number]        69 y.o.  G1P1001 for annual exam.  Several issues noted below.  Past medical history,surgical history, problem list, medications, allergies, family history and social history were all reviewed and documented as reviewed in the EPIC chart.  ROS:  Performed with pertinent positives and negatives included in the history, assessment and plan.   Additional significant findings :  none   Exam: Kim Counsellor Vitals:   08/23/15 0900  BP: 144/90  Height: 5\' 4"  (1.626 m)  Weight: 135 lb (61.236 kg)   General appearance:  Normal affect, orientation and appearance. Skin: Grossly normal HEENT: Without gross lesions.  No cervical or supraclavicular adenopathy. Thyroid normal.  Lungs:  Clear without wheezing, rales or rhonchi Cardiac: RR, without RMG Abdominal:  Soft, nontender, without masses, guarding, rebound, organomegaly or hernia Breasts:  Examined lying and sitting without masses, retractions, discharge or axillary adenopathy. Pelvic:  Ext/BUS/vagina with atrophic changes. Area the patient was concerned about is a normal small sebaceous cyst right of posterior fourchette. Mild cystocele/uterine prolapse noted.  Cervix atrophic changes  Uterus anteverted, normal size, shape and contour, midline and mobile nontender   Adnexa  Without masses or tenderness    Anus and perineum  Normal   Rectovaginal  Normal sphincter tone without palpated masses or tenderness.    Assessment/Plan:  69 y.o. G59P1001 female for annual exam.   1. Small classic sebaceous cyst right posterior fourchette. Patient noticed have been present for several months unchanged and did not bother her and then she was scratching and noticed that it seemed to go away. The area she's pointing to is just a small little sebaceous cyst granular size. Patient will continue to monitor as long as it doesn't change her bother her she'll follow. 2. Atrophic vaginal changes/postmenopausal.  Continues on Vagifem 10 g 3 times weekly with good results. Wants to continue and I refilled her 1 year. We discussed issues of absorption and possible systemic effects in the past as noted in my 10 2015 note and she is comfortable with this. 3. Osteopenia. DEXA 2015 T score -1.4 FRAX 8%/0.8%. Increased calcium vitamin D reviewed. Repeat DEXA next year a two-year interval. 4. Occasional buttocks HSV. Uses Valtrex 500 mg twice daily for a week intermittently as needed. Refill provided 5. Pelvic relaxation. Mild and asymptomatic. Continue to monitor report any issues. 6. Pap smear 2015. Patient requests annual Pap smears. She understands the current recommendations for less frequent screening. Pap smear was done today. 7. Colonoscopy 2009 with planned repeat interval reported at 10 years. 8. History of recurrent yeast infections. Keeps a supply of Diflucan at home. Diflucan 150 mg #10 provided to use when necessary this coming year. 9. Mammography 03/2015. Continue with annual mammography. 10. Health maintenance. Patient's blood pressure is 144/90. No history of hypertension previously. Patient notes he just took a Sudafed for coming to the office. Recommended that she have her blood pressure checked in a non-exam situation. If it would remain elevated she would need to follow up with her primary physician. If it normalizes and she will follow expectantly.  No routine blood work done as she reports this done at her primary physician's office. Follow up 1 year, sooner as needed.   Anastasio Auerbach MD, 9:26 AM 08/23/2015

## 2015-08-23 NOTE — Patient Instructions (Signed)

## 2015-08-23 NOTE — Addendum Note (Signed)
Addended by: Nelva Nay on: 08/23/2015 09:37 AM   Modules accepted: Orders

## 2015-08-24 LAB — URINALYSIS W MICROSCOPIC + REFLEX CULTURE
Bacteria, UA: NONE SEEN [HPF]
Bilirubin Urine: NEGATIVE
CASTS: NONE SEEN [LPF]
Crystals: NONE SEEN [HPF]
Glucose, UA: NEGATIVE
Hgb urine dipstick: NEGATIVE
Ketones, ur: NEGATIVE
Leukocytes, UA: NEGATIVE
NITRITE: NEGATIVE
PH: 6 (ref 5.0–8.0)
Protein, ur: NEGATIVE
RBC / HPF: NONE SEEN RBC/HPF (ref <=2)
SPECIFIC GRAVITY, URINE: 1.008 (ref 1.001–1.035)
Squamous Epithelial / LPF: NONE SEEN [HPF] (ref <=5)
WBC, UA: NONE SEEN WBC/HPF (ref <=5)
YEAST: NONE SEEN [HPF]

## 2015-08-25 LAB — CYTOLOGY - PAP

## 2015-09-04 ENCOUNTER — Other Ambulatory Visit: Payer: Self-pay | Admitting: Internal Medicine

## 2015-09-05 ENCOUNTER — Telehealth: Payer: Self-pay | Admitting: Internal Medicine

## 2015-09-05 MED ORDER — MUPIROCIN CALCIUM 2 % NA OINT: TOPICAL_OINTMENT | NASAL | Status: DC

## 2015-09-05 NOTE — Telephone Encounter (Signed)
Spoke with pt, states she has a staff infection in her nose Xcouple of days.  Denies fever, productive mucus.  States she had a similar problem back in August, was given cefdinir.   [facility] [address]   Pt is flying to [place] on Dec 2nd, has an ov scheduled with CY on Dec 1st.  Pt would either like to move up her appt to see CY or have CY call in something for her now and follow up with her ov as already scheduled.  Please call pt on the work # listed, verified number.    CY please advise.  Thanks!

## 2015-09-05 NOTE — Telephone Encounter (Signed)
Spoke with pt, aware of recs.  rx called in to pharmacy.  Nothing further needed.

## 2015-09-05 NOTE — Telephone Encounter (Signed)
If this is a sore in nose Rx Bactroban 2%, # 10 tubes      Alply 1/2 tube in each nostril twice daily x 5 days

## 2015-09-06 ENCOUNTER — Ambulatory Visit (INDEPENDENT_AMBULATORY_CARE_PROVIDER_SITE_OTHER): Payer: 59

## 2015-09-06 DIAGNOSIS — J309 Allergic rhinitis, unspecified: Secondary | ICD-10-CM

## 2015-09-13 ENCOUNTER — Telehealth: Payer: Self-pay | Admitting: Internal Medicine

## 2015-09-13 ENCOUNTER — Ambulatory Visit (INDEPENDENT_AMBULATORY_CARE_PROVIDER_SITE_OTHER): Payer: 59

## 2015-09-13 DIAGNOSIS — J309 Allergic rhinitis, unspecified: Secondary | ICD-10-CM

## 2015-09-13 NOTE — Telephone Encounter (Signed)
Allergy Serum Extract Date Mixed: 09/13/2015 Vial: AB Strength: 1:10 Here/Mail/Pick Up: Here Mixed By: Desmond Dike, CMA Last OV: 09/27/2014 Pending OV: 09/28/2015

## 2015-09-22 ENCOUNTER — Ambulatory Visit: Payer: 59

## 2015-09-28 ENCOUNTER — Ambulatory Visit (INDEPENDENT_AMBULATORY_CARE_PROVIDER_SITE_OTHER): Payer: 59 | Admitting: Internal Medicine

## 2015-09-28 ENCOUNTER — Encounter: Payer: Self-pay | Admitting: Internal Medicine

## 2015-09-28 ENCOUNTER — Ambulatory Visit (INDEPENDENT_AMBULATORY_CARE_PROVIDER_SITE_OTHER): Payer: 59

## 2015-09-28 VITALS — BP 128/66 | HR 78 | Ht 64.0 in | Wt 135.6 lb

## 2015-09-28 DIAGNOSIS — J069 Acute upper respiratory infection, unspecified: Secondary | ICD-10-CM | POA: Diagnosis not present

## 2015-09-28 DIAGNOSIS — J309 Allergic rhinitis, unspecified: Secondary | ICD-10-CM | POA: Diagnosis not present

## 2015-09-28 DIAGNOSIS — J45998 Other asthma: Secondary | ICD-10-CM | POA: Diagnosis not present

## 2015-09-28 DIAGNOSIS — J301 Allergic rhinitis due to pollen: Secondary | ICD-10-CM

## 2015-09-28 MED ORDER — AZITHROMYCIN 250 MG PO TABS: ORAL_TABLET | ORAL | Status: DC

## 2015-09-28 MED ORDER — ALBUTEROL SULFATE HFA 108 (90 BASE) MCG/ACT IN AERS: 2.0000 | INHALATION_SPRAY | Freq: Four times a day (QID) | RESPIRATORY_TRACT | Status: DC | PRN

## 2015-09-28 NOTE — Progress Notes (Signed)
Subjective:     Patient ID: Carrie Cox, female   DOB: 02-22-1946, 69 y.o.   MRN: [id number]  HPI  F never smoker followed by Dr Annamaria Boots on asthmanex for chronic rhinitis and asthma on allergy vaccines  02/03/2012-acute w/in ov/Wert cc Pt prod cough with minimal amount of green to yellow sputum x 1 wk. Also has had increased SOB with exertion and wheeze for 1 wk.  Denies problems with chronic or recurrent cough. Can't take any codeine derivatives, can't take pred but ok with shot. Min improvement p saba  In general Sleeping ok without nocturnal  or early am exacerbation  of respiratory  c/o's or need for noct saba. Also denies any obvious fluctuation of symptoms with weather or environmental changes or other aggravating or alleviating factors except as outlined above   07/20/12- 66 yoF never smoker followed for allergic rhinitis, asthma/ allergy vaccine ACUTE VISIT: unable to sleep due to cough, congestion, sinus pressure, Right ear pain; Finished Zpak yesterday and no better. Nonproductive cough lying down. Chest feels clear but SOB so hasn't tried rescue inhaler.. No sore throat or fever.  10/16/12- 43 yoF never smoker followed for allergic rhinitis, asthma/ allergy vaccine FOLLOWS FOR: doing good and still on allergy vaccine 1:10 GH, adding dog antigen gradually. . Some daily cough and awareness of postnasal drip but overall says she is "great". Had an upper respiratory infection in September but that resolved.   10/08/13- 67 yoF never smoker followed for allergic rhinitis, asthma/ allergy vaccine FOLLOWS FOR: Continues to get allergy vaccine injections every other week and doing well; denies any flare ups at this time. Tolerated appendectomy in may with no respiratory problems. Upper respiratory infection a few weeks ago now resolved. 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 We reviewed medications and her status through recent weather changes. She would like to hold prescription for Z-Pak this winter-discussed  09/28/2015-69 year old female never smoker followed for allergic rhinitis, asthma/allergy vaccine FOLLOW FOR:  Allergies; patient had staph infection in nose, constantly rubbing nose due to allergies; wants written paper rx for rescue inhaler and Zpak Pain in nose got better with Bactroban-either as antibiotic or soothing ointment. About to fly to [place]. Has not needed rescue inhaler all year.  ROS-see HPI Constitutional:   No-   weight loss, night sweats, fevers, chills, fatigue, lassitude. HEENT:   No-  headaches, difficulty swallowing, tooth/dental problems, sore throat,       No-  sneezing, itching, ear ache, nasal congestion, post nasal drip,  CV:  No-   chest pain, orthopnea, PND, swelling in lower extremities, anasarca,                                                           dizziness, palpitations Resp: No-   shortness of breath with exertion or at rest.              No-   productive cough,  No non-productive cough,  No- coughing up of blood.              No-   change in color of  mucus.  No- wheezing.   Skin: No-   rash or lesions. GI:  No-   heartburn, indigestion, abdominal pain, nausea, vomiting,  GU:  MS:  No-   joint pain or swelling.  Neuro-     nothing unusual Psych:  No- change in mood or affect. No depression or anxiety.  No memory loss.  OBJ- Physical Exam General- Alert, Oriented, Affect-appropriate, Distress- none acute Skin- rash-none, lesions- none, excoriation- none Lymphadenopathy- none Head- atraumatic            Eyes- Gross vision intact, PERRLA, conjunctivae and secretions clear            Ears- Hearing, canals-normal            Nose- Clear, no-Septal dev, mucus, polyps, erosion, perforation             Throat-  Mallampati II , mucosa clear , drainage- none, tonsils- atrophic Neck- flexible , trachea midline, no stridor , thyroid nl, carotid no bruit Chest - symmetrical excursion , unlabored           Heart/CV- RRR , no murmur , no gallop  , no rub, nl s1 s2                           - JVD- none , edema- none, stasis changes- none, varices- none           Lung- clear to P&A, wheeze- none, cough- none , dullness-none, rub- none           Chest wall-  Abd-  Br/ Gen/ Rectal- Not done, not indicated Extrem- cyanosis- none, clubbing, none, atrophy- none, strength- nl Neuro- grossly intact to observation   Assessment:         Plan:

## 2015-09-28 NOTE — Patient Instructions (Signed)
Scripts printed for Z pak and albuterol rescue inhaler to hold  Ok to use the Bactroban nasal ointment if needed  You might also see if the nasal saline gel is worth having, as discussed  Enjoy your trip !!  Please call if we can help

## 2015-10-01 NOTE — Assessment & Plan Note (Signed)
Asks Z-Pak to hold

## 2015-10-01 NOTE — Assessment & Plan Note (Signed)
Mild intermittent, well controlled, uncomplicated. Plan-refill rescue inhaler to hold

## 2015-10-01 NOTE — Assessment & Plan Note (Signed)
Husband had a MRSA infection so she assumed pain in nose was the same thing. Bactroban seemed to help but nasal saline ointment might do as well.

## 2015-10-09 ENCOUNTER — Ambulatory Visit (INDEPENDENT_AMBULATORY_CARE_PROVIDER_SITE_OTHER): Payer: 59 | Admitting: Internal Medicine

## 2015-10-09 ENCOUNTER — Encounter: Payer: Self-pay | Admitting: Internal Medicine

## 2015-10-09 VITALS — BP 134/70 | HR 77 | Temp 98.5°F | Ht 64.0 in | Wt 135.0 lb

## 2015-10-09 DIAGNOSIS — R059 Cough, unspecified: Secondary | ICD-10-CM

## 2015-10-09 DIAGNOSIS — R05 Cough: Secondary | ICD-10-CM

## 2015-10-09 MED ORDER — METHYLPREDNISOLONE ACETATE 80 MG/ML IJ SUSP
80.0000 mg | Freq: Once | INTRAMUSCULAR | Status: AC
  Administered 2015-10-09: 80 mg via INTRAMUSCULAR

## 2015-10-09 MED ORDER — AMOXICILLIN-POT CLAVULANATE 875-125 MG PO TABS: 1.0000 | ORAL_TABLET | Freq: Two times a day (BID) | ORAL | Status: DC

## 2015-10-09 MED ORDER — LEVALBUTEROL HCL 0.63 MG/3ML IN NEBU
0.6300 mg | INHALATION_SOLUTION | Freq: Once | RESPIRATORY_TRACT | Status: AC
  Administered 2015-10-09: 0.63 mg via RESPIRATORY_TRACT

## 2015-10-09 NOTE — Patient Instructions (Signed)
Script sent for augmentin antibiotic  Ok to also finish the last of the Z pak  OTC med for stomach ok- immodium, pepto-bismol, etc as needed  Depo 80  Neb xop 0.63

## 2015-10-09 NOTE — Progress Notes (Signed)
Subjective:     Patient ID: Carrie Cox, female   DOB: July 28, 1946, 69 y.o.   MRN: [id number]  HPI  F never smoker followed by Dr Annamaria Boots on asthmanex for chronic rhinitis and asthma on allergy vaccines  02/03/2012-acute w/in ov/Wert cc Pt prod cough with minimal amount of green to yellow sputum x 1 wk. Also has had increased SOB with exertion and wheeze for 1 wk.  Denies problems with chronic or recurrent cough. Can't take any codeine derivatives, can't take pred but ok with shot. Min improvement p saba  In general Sleeping ok without nocturnal  or early am exacerbation  of respiratory  c/o's or need for noct saba. Also denies any obvious fluctuation of symptoms with weather or environmental changes or other aggravating or alleviating factors except as outlined above   07/20/12- 66 yoF never smoker followed for allergic rhinitis, asthma/ allergy vaccine ACUTE VISIT: unable to sleep due to cough, congestion, sinus pressure, Right ear pain; Finished Zpak yesterday and no better. Nonproductive cough lying down. Chest feels clear but SOB so hasn't tried rescue inhaler.. No sore throat or fever.  10/16/12- 12 yoF never smoker followed for allergic rhinitis, asthma/ allergy vaccine FOLLOWS FOR: doing good and still on allergy vaccine 1:10 GH, adding dog antigen gradually. . Some daily cough and awareness of postnasal drip but overall says she is "great". Had an upper respiratory infection in September but that resolved.   10/08/13- 67 yoF never smoker followed for allergic rhinitis, asthma/ allergy vaccine FOLLOWS FOR: Continues to get allergy vaccine injections every other week and doing well; denies any flare ups at this time. Tolerated appendectomy in may with no respiratory problems. Upper respiratory infection a few weeks ago now resolved. 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 We reviewed medications and her status through recent weather changes. She would like to hold prescription for Z-Pak this winter-discussed  09/28/2015-69 year old female never smoker followed for allergic rhinitis, asthma/allergy vaccine FOLLOW FOR:  Allergies; patient had staph infection in nose, constantly rubbing nose due to allergies; wants written paper rx for rescue inhaler and Zpak Pain in nose got better with Bactroban-either as antibiotic or soothing ointment. About to fly to [place]. Has not needed rescue inhaler all year.  10/09/2015-69 year old female never smoker followed for allergic rhinitis, asthma/allergy vaccine Allergy vaccine 1:10 GH every other week FOLLOW FOR: coughing up yellow/green mucus; not responding to [place]. Just returned from [place]. Head hurts, ears hurt.  can't sleep from coughing so much.      ROS-see HPI Constitutional:   No-   weight loss, night sweats, fevers, chills, fatigue, lassitude. HEENT:   No-  headaches, difficulty swallowing, tooth/dental problems, sore throat,       No-  sneezing, itching, ear ache, nasal congestion, post nasal drip,  CV:  No-   chest pain, orthopnea, PND, swelling in lower extremities, anasarca,                                                           dizziness, palpitations Resp: No-   shortness of breath with exertion or at rest.  No-   productive cough,  No non-productive cough,  No- coughing up of blood.              No-   change in color of mucus.  No- wheezing.   Skin: No-   rash or lesions. GI:  No-   heartburn, indigestion, abdominal pain, nausea, vomiting,  GU:  MS:  No-   joint pain or swelling.  Neuro-     nothing unusual Psych:  No- change in mood or affect. No depression or anxiety.  No memory loss.  OBJ- Physical Exam General- Alert, Oriented, Affect-appropriate, Distress- none acute Skin-  rash-none, lesions- none, excoriation- none Lymphadenopathy- none Head- atraumatic            Eyes- Gross vision intact, PERRLA, conjunctivae and secretions clear            Ears- Hearing, canals-normal            Nose- Clear, no-Septal dev, mucus, polyps, erosion, perforation             Throat- Mallampati II , mucosa clear , drainage- none, tonsils- atrophic Neck- flexible , trachea midline, no stridor , thyroid nl, carotid no bruit Chest - symmetrical excursion , unlabored           Heart/CV- RRR , no murmur , no gallop  , no rub, nl s1 s2                           - JVD- none , edema- none, stasis changes- none, varices- none           Lung- clear to P&A, wheeze- none, cough- none , dullness-none, rub- none           Chest wall-  Abd-  Br/ Gen/ Rectal- Not done, not indicated Extrem- cyanosis- none, clubbing, none, atrophy- none, strength- nl Neuro- grossly intact to observation   Assessment:         Plan:

## 2015-10-13 ENCOUNTER — Ambulatory Visit: Payer: 59

## 2015-10-25 ENCOUNTER — Ambulatory Visit (INDEPENDENT_AMBULATORY_CARE_PROVIDER_SITE_OTHER): Payer: 59

## 2015-10-25 DIAGNOSIS — J309 Allergic rhinitis, unspecified: Secondary | ICD-10-CM | POA: Diagnosis not present

## 2015-11-08 ENCOUNTER — Ambulatory Visit (INDEPENDENT_AMBULATORY_CARE_PROVIDER_SITE_OTHER): Payer: 59

## 2015-11-08 DIAGNOSIS — J309 Allergic rhinitis, unspecified: Secondary | ICD-10-CM

## 2015-11-22 ENCOUNTER — Ambulatory Visit (INDEPENDENT_AMBULATORY_CARE_PROVIDER_SITE_OTHER): Payer: 59

## 2015-11-22 DIAGNOSIS — J309 Allergic rhinitis, unspecified: Secondary | ICD-10-CM

## 2015-12-06 ENCOUNTER — Ambulatory Visit (INDEPENDENT_AMBULATORY_CARE_PROVIDER_SITE_OTHER): Payer: 59

## 2015-12-06 DIAGNOSIS — J309 Allergic rhinitis, unspecified: Secondary | ICD-10-CM

## 2015-12-09 ENCOUNTER — Other Ambulatory Visit: Payer: Self-pay | Admitting: Internal Medicine

## 2015-12-12 ENCOUNTER — Other Ambulatory Visit: Payer: Self-pay

## 2015-12-12 MED ORDER — AZELASTINE HCL 0.1 % NA SOLN: 1.0000 | Freq: Two times a day (BID) | NASAL | Status: DC

## 2015-12-12 MED ORDER — MONTELUKAST SODIUM 10 MG PO TABS: 10.0000 mg | ORAL_TABLET | Freq: Every day | ORAL | Status: DC

## 2015-12-12 MED ORDER — FLUTICASONE PROPIONATE 50 MCG/ACT NA SUSP: NASAL | Status: DC

## 2015-12-20 ENCOUNTER — Ambulatory Visit (INDEPENDENT_AMBULATORY_CARE_PROVIDER_SITE_OTHER): Payer: 59

## 2015-12-20 ENCOUNTER — Encounter: Payer: Self-pay | Admitting: Internal Medicine

## 2015-12-20 DIAGNOSIS — J309 Allergic rhinitis, unspecified: Secondary | ICD-10-CM

## 2016-01-03 ENCOUNTER — Ambulatory Visit (INDEPENDENT_AMBULATORY_CARE_PROVIDER_SITE_OTHER): Payer: 59 | Admitting: *Deleted

## 2016-01-03 DIAGNOSIS — J309 Allergic rhinitis, unspecified: Secondary | ICD-10-CM

## 2016-01-17 ENCOUNTER — Ambulatory Visit (INDEPENDENT_AMBULATORY_CARE_PROVIDER_SITE_OTHER): Payer: 59 | Admitting: *Deleted

## 2016-01-17 DIAGNOSIS — J309 Allergic rhinitis, unspecified: Secondary | ICD-10-CM

## 2016-01-31 ENCOUNTER — Ambulatory Visit (INDEPENDENT_AMBULATORY_CARE_PROVIDER_SITE_OTHER): Payer: 59 | Admitting: *Deleted

## 2016-01-31 DIAGNOSIS — J309 Allergic rhinitis, unspecified: Secondary | ICD-10-CM | POA: Diagnosis not present

## 2016-02-14 ENCOUNTER — Ambulatory Visit (INDEPENDENT_AMBULATORY_CARE_PROVIDER_SITE_OTHER): Payer: 59 | Admitting: *Deleted

## 2016-02-14 DIAGNOSIS — J309 Allergic rhinitis, unspecified: Secondary | ICD-10-CM

## 2016-03-06 ENCOUNTER — Ambulatory Visit (INDEPENDENT_AMBULATORY_CARE_PROVIDER_SITE_OTHER): Payer: 59 | Admitting: *Deleted

## 2016-03-06 DIAGNOSIS — J309 Allergic rhinitis, unspecified: Secondary | ICD-10-CM | POA: Diagnosis not present

## 2016-03-14 ENCOUNTER — Encounter: Payer: Self-pay | Admitting: Internal Medicine

## 2016-03-19 ENCOUNTER — Ambulatory Visit (INDEPENDENT_AMBULATORY_CARE_PROVIDER_SITE_OTHER): Payer: 59 | Admitting: *Deleted

## 2016-03-19 DIAGNOSIS — J309 Allergic rhinitis, unspecified: Secondary | ICD-10-CM | POA: Diagnosis not present

## 2016-04-03 ENCOUNTER — Ambulatory Visit (INDEPENDENT_AMBULATORY_CARE_PROVIDER_SITE_OTHER): Payer: 59 | Admitting: *Deleted

## 2016-04-03 DIAGNOSIS — J309 Allergic rhinitis, unspecified: Secondary | ICD-10-CM

## 2016-04-17 ENCOUNTER — Ambulatory Visit (INDEPENDENT_AMBULATORY_CARE_PROVIDER_SITE_OTHER): Payer: 59 | Admitting: *Deleted

## 2016-04-17 DIAGNOSIS — J309 Allergic rhinitis, unspecified: Secondary | ICD-10-CM | POA: Diagnosis not present

## 2016-05-01 ENCOUNTER — Ambulatory Visit (INDEPENDENT_AMBULATORY_CARE_PROVIDER_SITE_OTHER): Payer: 59 | Admitting: *Deleted

## 2016-05-01 DIAGNOSIS — J309 Allergic rhinitis, unspecified: Secondary | ICD-10-CM | POA: Diagnosis not present

## 2016-05-06 ENCOUNTER — Encounter: Payer: Self-pay | Admitting: Gynecology

## 2016-05-14 ENCOUNTER — Ambulatory Visit (INDEPENDENT_AMBULATORY_CARE_PROVIDER_SITE_OTHER): Payer: 59 | Admitting: *Deleted

## 2016-05-14 DIAGNOSIS — J309 Allergic rhinitis, unspecified: Secondary | ICD-10-CM

## 2016-06-05 ENCOUNTER — Ambulatory Visit (INDEPENDENT_AMBULATORY_CARE_PROVIDER_SITE_OTHER): Payer: 59 | Admitting: *Deleted

## 2016-06-05 DIAGNOSIS — J309 Allergic rhinitis, unspecified: Secondary | ICD-10-CM | POA: Diagnosis not present

## 2016-06-12 ENCOUNTER — Telehealth: Payer: Self-pay | Admitting: Internal Medicine

## 2016-06-12 DIAGNOSIS — J309 Allergic rhinitis, unspecified: Secondary | ICD-10-CM | POA: Diagnosis not present

## 2016-06-12 NOTE — Telephone Encounter (Signed)
Allergy Serum Extract Date Mixed: 06/12/16 Vial: 2 Strength: 1:10 Here/Mail/Pick Up: here Mixed By: tbs Last OV: 09/28/15 Pending OV: 10/02/16

## 2016-06-19 ENCOUNTER — Ambulatory Visit (INDEPENDENT_AMBULATORY_CARE_PROVIDER_SITE_OTHER): Payer: 59 | Admitting: *Deleted

## 2016-06-19 DIAGNOSIS — J309 Allergic rhinitis, unspecified: Secondary | ICD-10-CM

## 2016-06-27 ENCOUNTER — Ambulatory Visit
Admission: RE | Admit: 2016-06-27 | Discharge: 2016-06-27 | Disposition: A | Discharge status: Home / Self Care | Payer: 59 | Source: Ambulatory Visit | Attending: Family Medicine | Admitting: Family Medicine

## 2016-06-27 ENCOUNTER — Other Ambulatory Visit: Payer: Self-pay | Admitting: Family Medicine

## 2016-06-27 DIAGNOSIS — T1490XA Injury, unspecified, initial encounter: Secondary | ICD-10-CM

## 2016-07-03 ENCOUNTER — Ambulatory Visit (INDEPENDENT_AMBULATORY_CARE_PROVIDER_SITE_OTHER): Payer: 59 | Admitting: *Deleted

## 2016-07-03 DIAGNOSIS — J309 Allergic rhinitis, unspecified: Secondary | ICD-10-CM | POA: Diagnosis not present

## 2016-07-17 ENCOUNTER — Ambulatory Visit (INDEPENDENT_AMBULATORY_CARE_PROVIDER_SITE_OTHER): Payer: 59 | Admitting: *Deleted

## 2016-07-17 DIAGNOSIS — J309 Allergic rhinitis, unspecified: Secondary | ICD-10-CM | POA: Diagnosis not present

## 2016-07-31 ENCOUNTER — Ambulatory Visit (INDEPENDENT_AMBULATORY_CARE_PROVIDER_SITE_OTHER): Payer: 59 | Admitting: *Deleted

## 2016-07-31 DIAGNOSIS — J309 Allergic rhinitis, unspecified: Secondary | ICD-10-CM

## 2016-08-14 ENCOUNTER — Ambulatory Visit (INDEPENDENT_AMBULATORY_CARE_PROVIDER_SITE_OTHER): Payer: 59 | Admitting: *Deleted

## 2016-08-14 DIAGNOSIS — J309 Allergic rhinitis, unspecified: Secondary | ICD-10-CM

## 2016-08-23 ENCOUNTER — Encounter: Payer: Self-pay | Admitting: Gynecology

## 2016-08-23 ENCOUNTER — Ambulatory Visit (INDEPENDENT_AMBULATORY_CARE_PROVIDER_SITE_OTHER): Payer: 59 | Admitting: Gynecology

## 2016-08-23 VITALS — BP 116/74 | Ht 64.0 in | Wt 133.0 lb

## 2016-08-23 DIAGNOSIS — M8580 Other specified disorders of bone density and structure, unspecified site: Secondary | ICD-10-CM | POA: Diagnosis not present

## 2016-08-23 DIAGNOSIS — Z01419 Encounter for gynecological examination (general) (routine) without abnormal findings: Secondary | ICD-10-CM | POA: Diagnosis not present

## 2016-08-23 DIAGNOSIS — A609 Anogenital herpesviral infection, unspecified: Secondary | ICD-10-CM | POA: Diagnosis not present

## 2016-08-23 DIAGNOSIS — N952 Postmenopausal atrophic vaginitis: Secondary | ICD-10-CM

## 2016-08-23 MED ORDER — FLUCONAZOLE 150 MG PO TABS: 150.0000 mg | ORAL_TABLET | Freq: Once | ORAL | 0 refills | Status: AC

## 2016-08-23 MED ORDER — VALACYCLOVIR HCL 500 MG PO TABS: 500.0000 mg | ORAL_TABLET | Freq: Two times a day (BID) | ORAL | 3 refills | Status: DC

## 2016-08-23 MED ORDER — ESTRADIOL 10 MCG VA TABS: ORAL_TABLET | VAGINAL | 3 refills | Status: DC

## 2016-08-23 NOTE — Progress Notes (Signed)
    MARDA MIR 06-Jun-1946 [id number]        70 y.o.  G1P1001  for annual exam. Doing well.  Past medical history,surgical history, problem list, medications, allergies, family history and social history were all reviewed and documented as reviewed in the EPIC chart.  ROS:  Performed with pertinent positives and negatives included in the history, assessment and plan.   Additional significant findings :  None   Exam: Caryn Bee assistant Vitals:   08/23/16 1555  BP: 116/74  Weight: 133 lb (60.3 kg)  Height: 5\' 4"  (1.626 m)   Body mass index is 22.83 kg/m.  General appearance:  Normal affect, orientation and appearance. Skin: Grossly normal HEENT: Without gross lesions.  No cervical or supraclavicular adenopathy. Thyroid normal.  Lungs:  Clear without wheezing, rales or rhonchi Cardiac: RR, without RMG Abdominal:  Soft, nontender, without masses, guarding, rebound, organomegaly or hernia Breasts:  Examined lying and sitting without masses, retractions, discharge or axillary adenopathy. Pelvic:  Ext, BUS, Vagina with atrophic changes. Mild cystocele/uterine prolapse noted.  Cervix with atrophic changes  Uterus anteverted, normal size, shape and contour, midline and mobile nontender   Adnexa without masses or tenderness    Anus and perineum normal   Rectovaginal normal sphincter tone without palpated masses or tenderness.    Assessment/Plan:  70 y.o. G24P1001 female for annual exam.   1. Was menopausal/atrophic genital changes. Continues on Vagifem 10 g 3 times weekly with good results and wants to continue. We discussed the issues of absorption and possible systemic effects such as breast and endometrial stimulation as well as thrombosis. Patient's comfortable continuing. Refill 1 year provided. 2. Osteopenia. DEXA 2015 T score -1.4 FRAX 8%/0.8%. Options for DEXA now our next year discussed. Patient wants to go ahead and schedule this year and she will go ahead and do so.  Increase calcium vitamin D reviewed. 3. Mammography 03/2016. Continue with annual mammography when due. SBE monthly reviewed. 4. Pap smear 2016. No Pap smear done today. No history of abnormal Pap smears previously. 5. Colonoscopy 2009. Reported repeat interval 10 years. 6. History of recurrent yeast infections. Patient keeps supply of Diflucan at home to treat when necessary. #10 Diflucan 150 mg provided. 7. Mild pelvic relaxation. Patient asymptomatic. Continue to monitor. Stable from prior exam. 8. Occasional HSV buttocks outbreak. Uses Valtrex 500 mg twice a day times several days without break.  #30 with 3 refills provided. 9. Health maintenance. No routine lab work done as patient reports this done elsewhere. Follow up for bone density otherwise 1 year for annual exam. Sooner if any issues.   Anastasio Auerbach MD, 4:22 PM 08/23/2016    1 were

## 2016-08-23 NOTE — Patient Instructions (Signed)

## 2016-08-28 ENCOUNTER — Ambulatory Visit (INDEPENDENT_AMBULATORY_CARE_PROVIDER_SITE_OTHER): Payer: 59 | Admitting: *Deleted

## 2016-08-28 DIAGNOSIS — J309 Allergic rhinitis, unspecified: Secondary | ICD-10-CM

## 2016-08-28 HISTORY — PX: CARPAL TUNNEL RELEASE: SHX101

## 2016-09-11 ENCOUNTER — Ambulatory Visit (INDEPENDENT_AMBULATORY_CARE_PROVIDER_SITE_OTHER): Payer: 59 | Admitting: *Deleted

## 2016-09-11 DIAGNOSIS — J309 Allergic rhinitis, unspecified: Secondary | ICD-10-CM | POA: Diagnosis not present

## 2016-09-25 ENCOUNTER — Ambulatory Visit (INDEPENDENT_AMBULATORY_CARE_PROVIDER_SITE_OTHER): Payer: 59 | Admitting: *Deleted

## 2016-09-25 DIAGNOSIS — J309 Allergic rhinitis, unspecified: Secondary | ICD-10-CM | POA: Diagnosis not present

## 2016-09-26 ENCOUNTER — Telehealth: Payer: Self-pay | Admitting: Internal Medicine

## 2016-09-26 NOTE — Telephone Encounter (Signed)
Does she want to cancel the appt???  He does not have any openings to move to this year  LMTCB

## 2016-09-26 NOTE — Telephone Encounter (Signed)
Pt returning call and doe not want to cx appoint she will be in on the 6th.social affective disorder.Carrie Cox

## 2016-10-02 ENCOUNTER — Encounter: Payer: Self-pay | Admitting: Internal Medicine

## 2016-10-02 ENCOUNTER — Ambulatory Visit (INDEPENDENT_AMBULATORY_CARE_PROVIDER_SITE_OTHER): Payer: 59 | Admitting: Internal Medicine

## 2016-10-02 DIAGNOSIS — J3089 Other allergic rhinitis: Secondary | ICD-10-CM | POA: Diagnosis not present

## 2016-10-02 DIAGNOSIS — J302 Other seasonal allergic rhinitis: Secondary | ICD-10-CM

## 2016-10-02 DIAGNOSIS — J4520 Mild intermittent asthma, uncomplicated: Secondary | ICD-10-CM | POA: Diagnosis not present

## 2016-10-02 MED ORDER — MONTELUKAST SODIUM 10 MG PO TABS: 10.0000 mg | ORAL_TABLET | Freq: Every day | ORAL | 3 refills | Status: DC

## 2016-10-02 MED ORDER — FLUTICASONE PROPIONATE 50 MCG/ACT NA SUSP: NASAL | 3 refills | Status: DC

## 2016-10-02 MED ORDER — AZELASTINE HCL 0.1 % NA SOLN: 1.0000 | Freq: Two times a day (BID) | NASAL | 3 refills | Status: DC

## 2016-10-02 NOTE — Assessment & Plan Note (Signed)
She has been on allergy vaccine a long time and should be able to stop one hour vaccine clinic closes in December, 2017. If necessary she can establish with another allergy practice. Plan-refill nasal sprays

## 2016-10-02 NOTE — Progress Notes (Signed)
Subjective:     Patient ID: Carrie Cox, female   DOB: 20-Dec-1945, 70 y.o.   MRN: [id number]  HPI F never smoker followed  for chronic rhinitis and asthma. Previously on allergy vaccine.    10/09/2015-70 year old female never smoker followed for allergic rhinitis, asthma/allergy vaccine Allergy vaccine 1:10 GH every other week FOLLOW FOR: coughing up yellow/green mucus; not responding to [place]. Just returned from [place]. Head hurts, ears hurt.  can't sleep from coughing so much.     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 Pt needs 90 day refills printed for Astelin, Flonase, and Singulair. Reports a very good year with no significant respiratory complications this time despite trips to [place], carpal tunnel surgery. Occasional use of rescue inhaler, no sleep disturbance. Has continued allergy vaccine which we'll stop here when our vaccine clinic closes at the end of December.  ROS-see HPI Constitutional:   No-   weight loss, night sweats, fevers, chills, fatigue, lassitude. HEENT:   No-  headaches, difficulty swallowing, tooth/dental problems, sore throat,       No-  sneezing, itching, ear ache, nasal congestion, post nasal drip,  CV:  No-   chest pain, orthopnea, PND, swelling in lower extremities, anasarca,                                                           dizziness, palpitations Resp: No-   shortness of breath with exertion or at rest.              No-   productive cough,  No non-productive cough,  No- coughing up of blood.              No-   change in color of mucus.  No- wheezing.   Skin: No-   rash or lesions. GI:  No-   heartburn, indigestion, abdominal pain, nausea, vomiting,  GU:  MS:  No-   joint pain or swelling.  Neuro-     nothing unusual Psych:  No- change in mood or affect. No depression or anxiety.  No memory loss.  OBJ- Physical  Exam General- Alert, Oriented, Affect-appropriate, Distress- none acute Skin- rash-none, lesions- none, excoriation- none Lymphadenopathy- none Head- atraumatic            Eyes- Gross vision intact, PERRLA, conjunctivae and secretions clear            Ears- Hearing, canals-normal            Nose- Clear, no-Septal dev, mucus, polyps, erosion, perforation             Throat- Mallampati II , mucosa clear , drainage- none, tonsils- atrophic Neck- flexible , trachea midline, no stridor , thyroid nl, carotid no bruit Chest - symmetrical excursion , unlabored           Heart/CV- RRR , no murmur , no gallop  , no rub, nl s1 s2                           - JVD- none , edema- none, stasis changes- none, varices- none           Lung- clear to P&A, wheeze- none, cough-  none , dullness-none, rub- none           Chest wall-  Abd-  Br/ Gen/ Rectal- Not done, not indicated Extrem- cyanosis- none, clubbing, none, atrophy- none, strength- nl,+ R hand dressing after surgery Neuro- grossly intact to observation

## 2016-10-02 NOTE — Assessment & Plan Note (Signed)
Good control with no significant exacerbations. Her main triggers in recent years have been viral infections. She continued continue current meds. Singulair was refilled.

## 2016-10-02 NOTE — Patient Instructions (Addendum)
Refills printed for azelastine, flonase and singulair  Glad this has been a good year!! Please call if we can help

## 2016-10-15 ENCOUNTER — Encounter: Payer: Self-pay | Admitting: Gynecology

## 2016-10-15 ENCOUNTER — Other Ambulatory Visit: Payer: Self-pay | Admitting: Gynecology

## 2016-10-15 ENCOUNTER — Ambulatory Visit (INDEPENDENT_AMBULATORY_CARE_PROVIDER_SITE_OTHER): Payer: 59

## 2016-10-15 DIAGNOSIS — M899 Disorder of bone, unspecified: Secondary | ICD-10-CM | POA: Diagnosis not present

## 2016-10-15 DIAGNOSIS — M8580 Other specified disorders of bone density and structure, unspecified site: Secondary | ICD-10-CM

## 2016-10-16 NOTE — Progress Notes (Signed)
Letter mailed to patient.

## 2016-11-12 ENCOUNTER — Telehealth: Payer: Self-pay | Admitting: Internal Medicine

## 2016-11-12 DIAGNOSIS — J4520 Mild intermittent asthma, uncomplicated: Secondary | ICD-10-CM

## 2016-11-12 DIAGNOSIS — J3089 Other allergic rhinitis: Secondary | ICD-10-CM

## 2016-11-12 DIAGNOSIS — J302 Other seasonal allergic rhinitis: Secondary | ICD-10-CM

## 2016-11-12 NOTE — Telephone Encounter (Signed)
Spoke with patient, aware of referral rec's per CY.  Requests that we send a referral to the listed MDs.  Referral sent. Nothing further needed.

## 2016-11-12 NOTE — Telephone Encounter (Signed)
Suggest referral to [facility] on [address] ( Office of Drs Neldon Mc, Turpin, et al). They can meet, retest and decide how to manage her, as appropriate for allergic rhinitis

## 2016-11-12 NOTE — Telephone Encounter (Signed)
Spoke with patient, states that she would like move forward with transferring her allergy injections to the Allergist that Dr Annamaria Boots recommends. Pt states that she received a letter from our office with recommendations but she has misplaced this letter. Please advise Dr Annamaria Boots if you recall who you were recommending the patient follow up with. Thanks.

## 2016-11-15 ENCOUNTER — Other Ambulatory Visit: Payer: Self-pay | Admitting: Internal Medicine

## 2016-11-27 ENCOUNTER — Other Ambulatory Visit: Payer: Self-pay | Admitting: Gynecology

## 2017-01-07 ENCOUNTER — Ambulatory Visit: Payer: 59 | Admitting: Allergy and Immunology

## 2017-04-23 ENCOUNTER — Encounter: Payer: Self-pay | Admitting: Gynecology

## 2017-04-29 ENCOUNTER — Encounter: Payer: Self-pay | Admitting: Allergy and Immunology

## 2017-04-29 ENCOUNTER — Ambulatory Visit (INDEPENDENT_AMBULATORY_CARE_PROVIDER_SITE_OTHER): Payer: 59 | Admitting: Allergy and Immunology

## 2017-04-29 VITALS — BP 120/76 | HR 80 | Resp 16 | Ht 63.58 in | Wt 129.8 lb

## 2017-04-29 DIAGNOSIS — H1045 Other chronic allergic conjunctivitis: Secondary | ICD-10-CM | POA: Diagnosis not present

## 2017-04-29 DIAGNOSIS — T7802XD Anaphylactic reaction due to shellfish (crustaceans), subsequent encounter: Secondary | ICD-10-CM | POA: Diagnosis not present

## 2017-04-29 DIAGNOSIS — J4520 Mild intermittent asthma, uncomplicated: Secondary | ICD-10-CM

## 2017-04-29 DIAGNOSIS — J3089 Other allergic rhinitis: Secondary | ICD-10-CM | POA: Diagnosis not present

## 2017-04-29 DIAGNOSIS — H1010 Acute atopic conjunctivitis, unspecified eye: Secondary | ICD-10-CM

## 2017-04-29 MED ORDER — OLOPATADINE HCL 0.2 % OP SOLN: OPHTHALMIC | 1 refills | Status: DC

## 2017-04-29 NOTE — Progress Notes (Signed)
Dear Dr. Inda Merlin,  Thank you for referring Carrie Cox to the [facility] on 04/29/2017.   Below is a summation of this patient's evaluation and recommendations.  Thank you for your referral. I will keep you informed about this patient's response to treatment.   If you have any questions please do not hesitate to contact me.   Sincerely,  Jiles Prows, MD Allergy / Immunology [facility]   ______________________________________________________________________    NEW PATIENT NOTE  Referring Provider: Darcus Austin, MD Primary Provider: Darcus Austin, MD Date of office visit: 04/29/2017    Subjective:   Chief Complaint:  Carrie Cox (DOB: 1946-05-12) is a 71 y.o. female who presents to the clinic on 04/29/2017 with a chief complaint of Allergic Rhinitis  and Asthma .     HPI: Carrie Cox presents to this clinic in evaluation of allergies. She has been a long-standing patient of Dr. Keturah Barre and has received immunotherapy for greater than 10 years which she discontinued this November.  Carrie Cox believes that the administration of immunotherapy has resulted in good control of her recurrent issues involving her upper airways and her asthma. Since discontinuing immunotherapy in November she has noticed that she has had more sneezing and more itchy red watery eyes and a little bit more of a cough with a tickle in her throat. She specifically states that this is "not a reflux cough". This also appears to result in issues with earaches. Provoking factors for her symptoms include exposure to the outdoors. All these symptoms are occurring even though she remains on a large collection of anti-inflammatory medications for her respiratory tract. She is interested in restarting a course of immunotherapy.  She has a very distant history of asthma and rarely uses a short acting bronchodilator and has not  received a systemic steroid in almost a year in the treatment of this condition. Smoke and strong scent exposures appear to be precipitants regarding this issue.  She has a history of shellfish reaction. She developed vomiting and sweating and breathing problems about 30 years ago after shellfish consumption and has been without consumption of shellfish since that point in time.  Alvenia also has a history of developing a problem with yellow jackets. About 50 years ago she was stung on the foot by yellow jacket and developed what sounds like diffuse urticaria. Apparently she received immunotherapy for several years which was subsequently terminated for some reason which may have been that she completed her course of immunotherapy. She has not been stung since.  Past Medical History:  Diagnosis Date  . Asthma   . Atrophic vaginitis    uses vagifem 3 X week to  . HSV infection   . Multiple allergies   . Neck injury   . Osteopenia 09/2016   T score -1.8 FRAX 12%/2.6%. Overall stable from prior DEXA    Past Surgical History:  Procedure Laterality Date  . ADENOIDECTOMY  1955  . CARPAL TUNNEL RELEASE Right 08/2016  . FOOT SURGERY     BILATERAL ORTHO  . LAPAROSCOPIC APPENDECTOMY N/A 03/04/2013   Procedure: APPENDECTOMY LAPAROSCOPIC;  Surgeon: Shann Medal, MD;  Location: [facility];  Service: General;  Laterality: N/A;  . TONSILLECTOMY     and adenoids  . TONSILLECTOMY  1955    Allergies as of 04/29/2017      Reactions   Bee Venom Anaphylaxis   Allergic to  yellow jackets   Shellfish Allergy Anaphylaxis   Codeine Nausea And Vomiting   Doxycycline    Stomach pain   Erythromycin    Stomach pain   Nsaids Nausea Only   Terrible pains in stomach   Other    Pain meds in general --Nausea and vomiting Artificial Sweetners- vomiting and swelling      Medication List      ALPRAZolam 0.25 MG tablet Commonly known as:  XANAX Take 0.25 mg by mouth at bedtime as needed for anxiety.     azelastine 0.1 % nasal spray Commonly known as:  ASTELIN Place 1 spray into both nostrils 2 (two) times daily.   BENADRYL PO Take by mouth as needed.   carisoprodol 350 MG tablet Commonly known as:  SOMA Take 350 mg by mouth 4 (four) times daily as needed for muscle spasms.   VITAMIN D PO Take 2,000 Units by mouth daily.   cholecalciferol 1000 units tablet Commonly known as:  VITAMIN D Take 1,000 Units by mouth daily.   EPINEPHrine 0.3 mg/0.3 mL Soaj injection Commonly known as:  EPIPEN 2-PAK Inject into thigh for severe allergic reaction   escitalopram 10 MG tablet Commonly known as:  LEXAPRO Take 10 mg by mouth daily.   fluticasone 50 MCG/ACT nasal spray Commonly known as:  FLONASE 1-2 puffs each nostril once daily as needed   loratadine 10 MG tablet Commonly known as:  CLARITIN Take 10 mg by mouth daily as needed for allergies.   montelukast 10 MG tablet Commonly known as:  SINGULAIR Take 1 tablet (10 mg total) by mouth daily.   NONFORMULARY OR COMPOUNDED ITEM Allergy Vaccine 1:10 Given at [facility]   PROAIR HFA 108 (90 Base) MCG/ACT inhaler Generic drug:  albuterol Inhale two puffs every four to six hours as needed for cough or wheeze.   pseudoephedrine 30 MG tablet Commonly known as:  SUDAFED Take 30 mg by mouth as needed for congestion.   ROBITUSSIN DM PO Take by mouth as needed.   valACYclovir 1000 MG tablet Commonly known as:  VALTREX Take 1,000 mg by mouth daily as needed.   YUVAFEM 10 MCG Tabs vaginal tablet Generic drug:  Estradiol INSERT 1 TABLET VAGINALLY THREE TIMES A WEEK       Review of systems negative except as noted in HPI / PMHx or noted below:  Review of Systems  Constitutional: Negative.   HENT: Negative.   Eyes: Negative.   Respiratory: Negative.   Cardiovascular: Negative.   Gastrointestinal: Negative.   Genitourinary: Negative.   Musculoskeletal: Negative.   Skin: Negative.   Neurological: Negative.    Endo/Heme/Allergies: Negative.   Psychiatric/Behavioral: Negative.     Family History  Problem Relation Age of Onset  . Diabetes Mother   . Heart disease Mother   . Diabetes Brother     Social History   Social History  . Marital status: Married    Spouse name: N/A  . Number of children: N/A  . Years of education: N/A   Occupational History  . Not on file.   Social History Main Topics  . Smoking status: Never Smoker  . Smokeless tobacco: Never Used  . Alcohol use 1.2 oz/week    2 Standard drinks or equivalent per week     Comment: occas  . Drug use: No  . Sexual activity: Yes    Birth control/ protection: Post-menopausal     Comment: 1st intercourse 71 yo-Fewer than 5 partners   Other Topics Concern  .  Not on file   Social History Narrative  . No narrative on file    Environmental and Social history  Lives in a house with a dry environment, cats and dogs and birds located inside the household, no carpeting in the bedroom, plastic on the bed, plastic on the pillow, and no smokers located inside the household. She works in an office setting.  Objective:   Vitals:   04/29/17 1452  BP: 120/76  Pulse: 80  Resp: 16   Height: 5' 3.58" (161.5 cm) Weight: 129 lb 12.8 oz (58.9 kg)  Physical Exam  Constitutional: She is well-developed, well-nourished, and in no distress.  Rubbing of nose, slight nasal crease  HENT:  Head: Normocephalic. Head is without right periorbital erythema and without left periorbital erythema.  Right Ear: Tympanic membrane, external ear and ear canal normal.  Left Ear: Tympanic membrane, external ear and ear canal normal.  Nose: Nose normal. No mucosal edema or rhinorrhea.  Mouth/Throat: Uvula is midline, oropharynx is clear and moist and mucous membranes are normal. No oropharyngeal exudate.  Eyes: Conjunctivae and lids are normal. Pupils are equal, round, and reactive to light.  Neck: Trachea normal. No tracheal tenderness present. No  tracheal deviation present. No thyromegaly present.  Cardiovascular: Normal rate, regular rhythm, S1 normal, S2 normal and normal heart sounds.   No murmur heard. Pulmonary/Chest: Effort normal and breath sounds normal. No stridor. No tachypnea. No respiratory distress. She has no wheezes. She has no rales. She exhibits no tenderness.  Abdominal: Soft. She exhibits no distension and no mass. There is no hepatosplenomegaly. There is no tenderness. There is no rebound and no guarding.  Musculoskeletal: She exhibits no edema or tenderness.  Lymphadenopathy:       Head (right side): No tonsillar adenopathy present.       Head (left side): No tonsillar adenopathy present.    She has no cervical adenopathy.    She has no axillary adenopathy.  Neurological: She is alert. Gait normal.  Skin: No rash noted. She is not diaphoretic. No erythema. No pallor. Nails show no clubbing.  Psychiatric: Mood and affect normal.    Diagnostics: Allergy skin tests were performed. She demonstrated hypersensitivity to cats, house dust mite, and to a lesser degree mold.  Spirometry was performed and demonstrated an FEV1 of 2.28 @ 99 % of predicted.  Assessment and Plan:    1. Other allergic rhinitis   2. Seasonal allergic conjunctivitis   3. Anaphylactic shock due to shellfish, subsequent encounter   4. Asthma, mild intermittent, well-controlled     1. Perform allergen avoidance measures  2. Treat and prevent inflammation:   A. montelukast 10 mg tablet 1 time per day  B. Flonase 1-2 sprays each nostril one time per day  3. If needed:   A. Azelastine 1-2 sprays each nostril 1-2 times per day  B. OTC antihistamine - Zyrtec/Claritin/Allegra  C. nasal saline spray  D. EpiPen, Benadryl, M.D./ER evaluation for allergic reaction  E. Proventil HFA 2 puffs every 4-6 hours  F. Pataday one drop each eye one time per day  4. Consider restarting immunotherapy  5. Obtain fall flu vaccine  6. Return to clinic  in 6 months or earlier if problem  Carrie Cox is allergic and unfortunately she is allergic to her cat. She is presently considering restarting a course of immunotherapy directed against specific aeroallergens. I have given her some anti-inflammatory agents for her respiratory tract as noted above. She has a collection of other medications  that she can use if needed. We have provided her a injectable epinephrine device for her history of shellfish allergy. I will see her back in this clinic in 6 months or earlier if there is a problem.  Jiles Prows, MD [facility]

## 2017-04-29 NOTE — Patient Instructions (Addendum)
  1. Perform allergen avoidance measures  2. Treat and prevent inflammation:   A. montelukast 10 mg tablet 1 time per day  B. Flonase 1-2 sprays each nostril one time per day  3. If needed:   A. Azelastine 1-2 sprays each nostril 1-2 times per day  B. OTC antihistamine - Zyrtec/Claritin/Allegra  C. nasal saline spray  D. EpiPen, Benadryl, M.D./ER evaluation for allergic reaction  E. Proventil HFA 2 puffs every 4-6 hours  F. Pataday one drop each eye one time per day  4. Consider restarting immunotherapy  5. Obtain fall flu vaccine  6. Return to clinic in 6 months or earlier if problem

## 2017-05-15 ENCOUNTER — Other Ambulatory Visit: Payer: Self-pay | Admitting: Allergy and Immunology

## 2017-05-15 DIAGNOSIS — J3089 Other allergic rhinitis: Secondary | ICD-10-CM

## 2017-05-20 NOTE — Progress Notes (Signed)
Vials exp 05-23-18

## 2017-05-22 DIAGNOSIS — J3081 Allergic rhinitis due to animal (cat) (dog) hair and dander: Secondary | ICD-10-CM | POA: Diagnosis not present

## 2017-05-23 DIAGNOSIS — J3089 Other allergic rhinitis: Secondary | ICD-10-CM | POA: Diagnosis not present

## 2017-05-28 ENCOUNTER — Ambulatory Visit: Payer: 59

## 2017-05-29 ENCOUNTER — Ambulatory Visit: Payer: 59

## 2017-06-03 IMAGING — CR DG FINGER LITTLE 2+V*R*
3 series · 3 of 3 positions shown · non-contrast
Comparison: Right hand 01/15/2008

ADDENDUM:
Typographical error in the impression.

Correct impression as below:
No acute osseous injury of the RIGHT fifth digit.
Mild osteoarthritis of the fifth DIP joint.
CLINICAL DATA: Hit finger on something plastic 5 days ago continues
to hurt and swell at PIP joint hx of old fx at DIP yrs ago per pt.
EXAM:
RIGHT LITTLE FINGER 2+V

[x finger pa right]
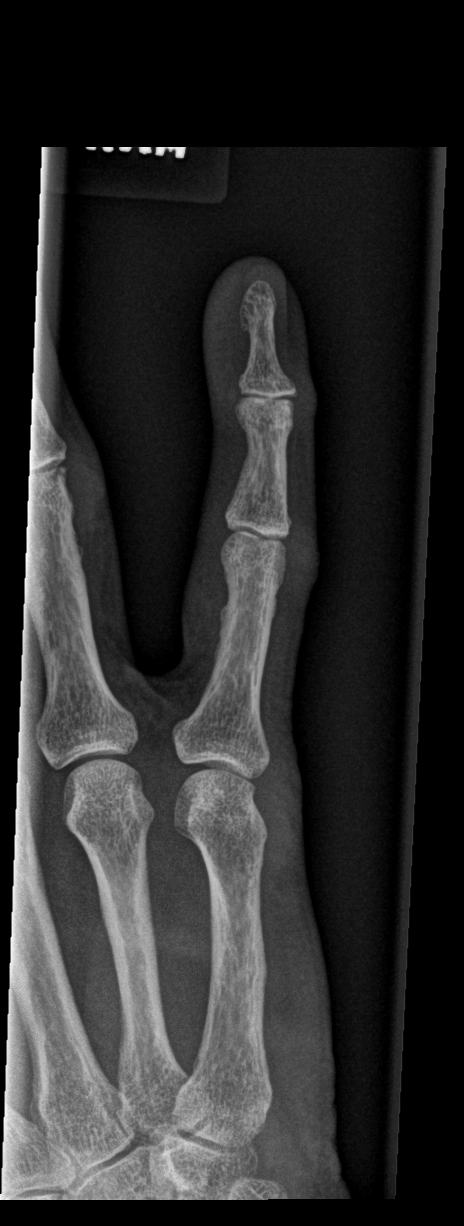

[x finger obl right]
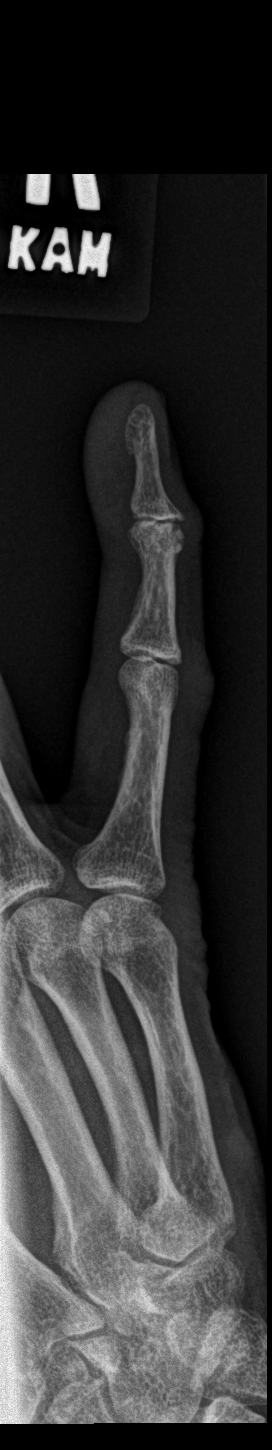

[x finger lat right]
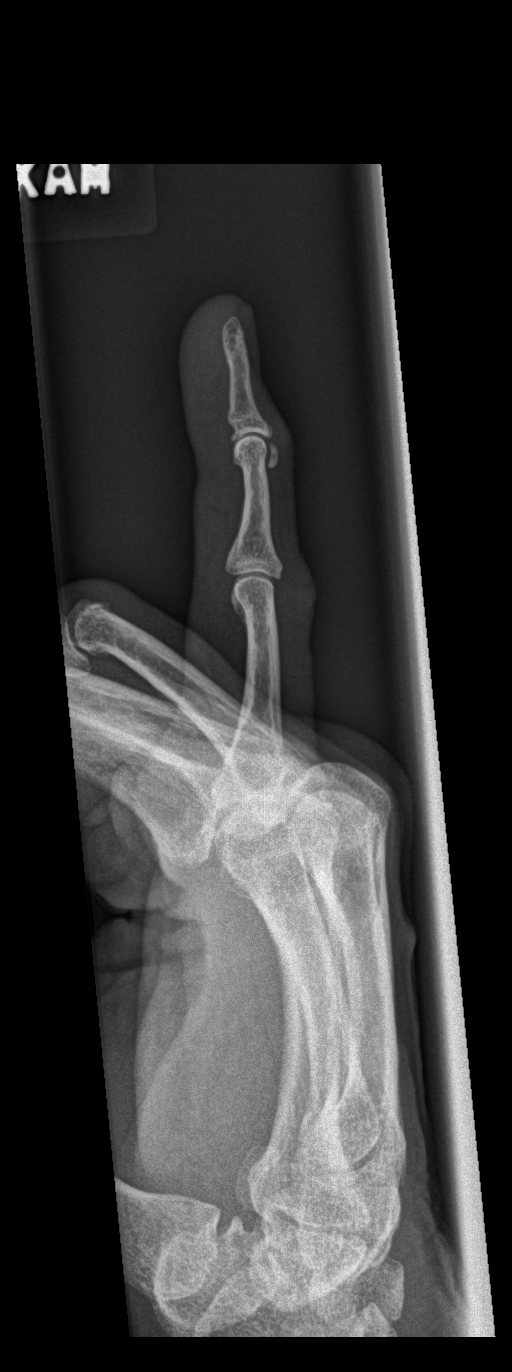

[3 of 3 positions shown; findings below may reference images not displayed]

FINDINGS: There is no evidence of an acute fracture or dislocation. Old
ununited avulsion fracture along the dorsal aspect of the fifth DIP
joint. There is mild osteoarthritis of the fifth DIP joint. Soft
tissues are unremarkable.
IMPRESSION: No acute osseous injury of the left fifth digit.

Mild osteoarthritis of the fifth DIP joint.

## 2017-06-26 ENCOUNTER — Ambulatory Visit (INDEPENDENT_AMBULATORY_CARE_PROVIDER_SITE_OTHER): Payer: 59 | Admitting: *Deleted

## 2017-06-26 DIAGNOSIS — J309 Allergic rhinitis, unspecified: Secondary | ICD-10-CM

## 2017-06-26 NOTE — Progress Notes (Signed)
Immunotherapy   Patient Details  Name: Carrie Cox MRN: [id number] Date of Birth: November 01, 1945  06/26/2017  Doree Fudge started injections for  Cat & DMite. Following schedule: B  Frequency:2 times per week Epi-Pen:Epi-Pen Available  Consent signed and patient instructions given. No problems after 30 minutes in the office.   Constance Holster 06/26/2017, 6:51 PM

## 2017-07-03 ENCOUNTER — Ambulatory Visit (INDEPENDENT_AMBULATORY_CARE_PROVIDER_SITE_OTHER): Payer: 59 | Admitting: *Deleted

## 2017-07-03 DIAGNOSIS — J309 Allergic rhinitis, unspecified: Secondary | ICD-10-CM | POA: Diagnosis not present

## 2017-07-10 ENCOUNTER — Ambulatory Visit (INDEPENDENT_AMBULATORY_CARE_PROVIDER_SITE_OTHER): Payer: 59 | Admitting: *Deleted

## 2017-07-10 DIAGNOSIS — J309 Allergic rhinitis, unspecified: Secondary | ICD-10-CM | POA: Diagnosis not present

## 2017-07-15 ENCOUNTER — Ambulatory Visit (INDEPENDENT_AMBULATORY_CARE_PROVIDER_SITE_OTHER): Payer: 59 | Admitting: *Deleted

## 2017-07-15 DIAGNOSIS — J309 Allergic rhinitis, unspecified: Secondary | ICD-10-CM | POA: Diagnosis not present

## 2017-07-22 ENCOUNTER — Ambulatory Visit (INDEPENDENT_AMBULATORY_CARE_PROVIDER_SITE_OTHER): Payer: 59 | Admitting: *Deleted

## 2017-07-22 DIAGNOSIS — J309 Allergic rhinitis, unspecified: Secondary | ICD-10-CM

## 2017-07-30 ENCOUNTER — Ambulatory Visit (INDEPENDENT_AMBULATORY_CARE_PROVIDER_SITE_OTHER): Payer: 59

## 2017-07-30 DIAGNOSIS — J309 Allergic rhinitis, unspecified: Secondary | ICD-10-CM | POA: Diagnosis not present

## 2017-08-01 ENCOUNTER — Ambulatory Visit (INDEPENDENT_AMBULATORY_CARE_PROVIDER_SITE_OTHER): Payer: 59

## 2017-08-01 DIAGNOSIS — J309 Allergic rhinitis, unspecified: Secondary | ICD-10-CM | POA: Diagnosis not present

## 2017-08-11 ENCOUNTER — Other Ambulatory Visit: Payer: Self-pay | Admitting: Internal Medicine

## 2017-08-11 MED ORDER — AZELASTINE HCL 0.1 % NA SOLN: 1.0000 | Freq: Two times a day (BID) | NASAL | 3 refills | Status: DC

## 2017-08-12 ENCOUNTER — Ambulatory Visit (INDEPENDENT_AMBULATORY_CARE_PROVIDER_SITE_OTHER): Payer: 59 | Admitting: *Deleted

## 2017-08-12 DIAGNOSIS — J309 Allergic rhinitis, unspecified: Secondary | ICD-10-CM

## 2017-08-14 ENCOUNTER — Ambulatory Visit (INDEPENDENT_AMBULATORY_CARE_PROVIDER_SITE_OTHER): Payer: 59 | Admitting: *Deleted

## 2017-08-14 DIAGNOSIS — J309 Allergic rhinitis, unspecified: Secondary | ICD-10-CM | POA: Diagnosis not present

## 2017-08-19 ENCOUNTER — Ambulatory Visit (INDEPENDENT_AMBULATORY_CARE_PROVIDER_SITE_OTHER): Payer: 59 | Admitting: *Deleted

## 2017-08-19 DIAGNOSIS — J309 Allergic rhinitis, unspecified: Secondary | ICD-10-CM | POA: Diagnosis not present

## 2017-08-25 ENCOUNTER — Ambulatory Visit (INDEPENDENT_AMBULATORY_CARE_PROVIDER_SITE_OTHER): Payer: 59 | Admitting: *Deleted

## 2017-08-25 DIAGNOSIS — J309 Allergic rhinitis, unspecified: Secondary | ICD-10-CM | POA: Diagnosis not present

## 2017-09-02 ENCOUNTER — Ambulatory Visit (INDEPENDENT_AMBULATORY_CARE_PROVIDER_SITE_OTHER): Payer: 59 | Admitting: *Deleted

## 2017-09-02 DIAGNOSIS — J309 Allergic rhinitis, unspecified: Secondary | ICD-10-CM

## 2017-09-09 ENCOUNTER — Ambulatory Visit (INDEPENDENT_AMBULATORY_CARE_PROVIDER_SITE_OTHER): Payer: 59 | Admitting: *Deleted

## 2017-09-09 DIAGNOSIS — J309 Allergic rhinitis, unspecified: Secondary | ICD-10-CM

## 2017-09-11 ENCOUNTER — Encounter: Payer: Self-pay | Admitting: Gynecology

## 2017-09-11 ENCOUNTER — Ambulatory Visit (INDEPENDENT_AMBULATORY_CARE_PROVIDER_SITE_OTHER): Payer: 59 | Admitting: Gynecology

## 2017-09-11 VITALS — BP 134/80 | Ht 64.0 in | Wt 135.0 lb

## 2017-09-11 DIAGNOSIS — M8580 Other specified disorders of bone density and structure, unspecified site: Secondary | ICD-10-CM | POA: Diagnosis not present

## 2017-09-11 DIAGNOSIS — N952 Postmenopausal atrophic vaginitis: Secondary | ICD-10-CM

## 2017-09-11 DIAGNOSIS — A609 Anogenital herpesviral infection, unspecified: Secondary | ICD-10-CM

## 2017-09-11 DIAGNOSIS — N8189 Other female genital prolapse: Secondary | ICD-10-CM

## 2017-09-11 DIAGNOSIS — Z01411 Encounter for gynecological examination (general) (routine) with abnormal findings: Secondary | ICD-10-CM

## 2017-09-11 MED ORDER — VALACYCLOVIR HCL 1 G PO TABS: 1000.0000 mg | ORAL_TABLET | Freq: Every day | ORAL | 2 refills | Status: DC | PRN

## 2017-09-11 MED ORDER — ESTRADIOL 10 MCG VA TABS: ORAL_TABLET | VAGINAL | 3 refills | Status: DC

## 2017-09-11 NOTE — Progress Notes (Signed)
    ROWYN MUSTAPHA 02-14-1946 [id number]        71 y.o.  G1P1001 for annual gynecologic exam.  Doing well.  Several issues noted below.  Past medical history,surgical history, problem list, medications, allergies, family history and social history were all reviewed and documented as reviewed in the EPIC chart.  ROS:  Performed with pertinent positives and negatives included in the history, assessment and plan.   Additional significant findings : None   Exam: Caryn Bee assistant Vitals:   09/11/17 1037  BP: 134/80  Weight: 135 lb (61.2 kg)  Height: 5\' 4"  (1.626 m)   Body mass index is 23.17 kg/m.  General appearance:  Normal affect, orientation and appearance. Skin: Grossly normal HEENT: Without gross lesions.  No cervical or supraclavicular adenopathy. Thyroid normal.  Lungs:  Clear without wheezing, rales or rhonchi Cardiac: RR, without RMG Abdominal:  Soft, nontender, without masses, guarding, rebound, organomegaly or hernia Breasts:  Examined lying and sitting without masses, retractions, discharge or axillary adenopathy. Pelvic:  Ext, BUS, Vagina: With atrophic changes.  Mild cystocele/uterine prolapse noted.  Cervix: With atrophic changes  Uterus: Anteverted, normal size, shape and contour, midline and mobile nontender   Adnexa: Without masses or tenderness    Anus and perineum: Normal   Rectovaginal: Normal sphincter tone without palpated masses or tenderness.    Assessment/Plan:  71 y.o. G19P1001 female for annual gynecologic exam.   1. Postmenopausal/atrophic genital changes.  Continues with Vagifem 10 mcg 3 times weekly with good results.  We have discussed the issues of absorption and potential effects such as thrombosis, breast and endometrial stimulation.  Patient comfortable continuing and I refilled her times 1 year.  No bleeding at all.  Knows to call if any vaginal bleeding. 2. Osteopenia.  DEXA 09/2016 T score -1.8 FRAX 12% / 2.6%.  Stable from prior DEXA.   Plan repeat DEXA next year at 2-year interval. 3. Mammography 03/2017.  Continue with annual mammography when due.  SBE monthly reviewed.  Breast exam normal today. 4. Pap smear 2016.  No Pap smear done today.  No history of abnormal Pap smears.  Plan repeat Pap smear next year at 3-year interval per current screening guidelines. 5. Colonoscopy 2018.  Repeat at their recommended interval. 6. Pelvic relaxation.  Mild cystocele and uterine prolapse.  Stable on exam.  Asymptomatic to the patient.  Continue to monitor and report any issues. 7. HSV buttocks outbreaks.  Occasional outbreaks.  Uses Valtrex intermittently.  Refill times 1 year provided. 8. Health maintenance.  No routine lab work done as patient does this elsewhere.  Follow-up 1 year, sooner as needed.   Anastasio Auerbach MD, 11:15 AM 09/11/2017

## 2017-09-11 NOTE — Patient Instructions (Signed)
Follow-up in 1 year for annual exam, sooner if any issues. 

## 2017-09-16 ENCOUNTER — Ambulatory Visit (INDEPENDENT_AMBULATORY_CARE_PROVIDER_SITE_OTHER): Payer: 59

## 2017-09-16 DIAGNOSIS — J309 Allergic rhinitis, unspecified: Secondary | ICD-10-CM

## 2017-09-23 ENCOUNTER — Ambulatory Visit (INDEPENDENT_AMBULATORY_CARE_PROVIDER_SITE_OTHER): Payer: 59 | Admitting: *Deleted

## 2017-09-23 DIAGNOSIS — J309 Allergic rhinitis, unspecified: Secondary | ICD-10-CM

## 2017-10-02 ENCOUNTER — Ambulatory Visit: Payer: 59 | Admitting: Internal Medicine

## 2017-10-02 ENCOUNTER — Encounter: Payer: Self-pay | Admitting: Internal Medicine

## 2017-10-02 ENCOUNTER — Ambulatory Visit (INDEPENDENT_AMBULATORY_CARE_PROVIDER_SITE_OTHER): Payer: 59 | Admitting: *Deleted

## 2017-10-02 DIAGNOSIS — J3089 Other allergic rhinitis: Secondary | ICD-10-CM

## 2017-10-02 DIAGNOSIS — J309 Allergic rhinitis, unspecified: Secondary | ICD-10-CM

## 2017-10-02 DIAGNOSIS — J4520 Mild intermittent asthma, uncomplicated: Secondary | ICD-10-CM

## 2017-10-02 DIAGNOSIS — J302 Other seasonal allergic rhinitis: Secondary | ICD-10-CM | POA: Diagnosis not present

## 2017-10-02 MED ORDER — EPINEPHRINE 0.3 MG/0.3ML IJ SOAJ: INTRAMUSCULAR | 99 refills | Status: DC

## 2017-10-02 MED ORDER — FLUTICASONE PROPIONATE 50 MCG/ACT NA SUSP: NASAL | 3 refills | Status: DC

## 2017-10-02 MED ORDER — AZELASTINE HCL 0.1 % NA SOLN: 1.0000 | Freq: Two times a day (BID) | NASAL | 3 refills | Status: DC

## 2017-10-02 MED ORDER — AZITHROMYCIN 250 MG PO TABS: ORAL_TABLET | ORAL | 0 refills | Status: DC

## 2017-10-02 MED ORDER — MONTELUKAST SODIUM 10 MG PO TABS: 10.0000 mg | ORAL_TABLET | Freq: Every day | ORAL | 3 refills | Status: DC

## 2017-10-02 MED ORDER — ALBUTEROL SULFATE HFA 108 (90 BASE) MCG/ACT IN AERS: INHALATION_SPRAY | RESPIRATORY_TRACT | 12 refills | Status: DC

## 2017-10-02 NOTE — Patient Instructions (Signed)
Meds refilled  So glad you have done well !  Please call if we can help

## 2017-10-02 NOTE — Assessment & Plan Note (Signed)
Uncomplicated and well controlled.  Mainly using Singulair with rescue inhaler available but not used in a long time.  She was restarted on allergy vaccine by her allergist. I gave her the option of seeking future refills through her PCP and allergist to reduce the number of doctor visits but she wanted to make a follow-up appointment at this time.

## 2017-10-02 NOTE — Progress Notes (Signed)
Subjective:     Patient ID: Carrie Cox, female   DOB: 04/07/1946, 71 y.o.   MRN: [id number]  HPI F never smoker followed  for chronic rhinitis and asthma.   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 Pt needs 90 day refills printed for Astelin, Flonase, and Singulair. Reports a very good year with no significant respiratory complications this time despite trips to [place], carpal tunnel surgery. Occasional use of rescue inhaler, no sleep disturbance. Has continued allergy vaccine which we'll stop here when our vaccine clinic closes at the end of December.  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 On allergy vaccine now from Dr. Neldon Mc Asks for medication refills including Z-Pak to hold, EpiPen to hold, rescue inhaler to hold.  Has not needed rescue inhaler in a couple of years.  Refill for Flonase, Astelin and Singulair are being sent. UTD flu and pneumonia vaccines Nonsurgical treatment for back pain  ROS-see HPI    + = positive Constitutional:   No-   weight loss, night sweats, fevers, chills, fatigue, lassitude. HEENT:   No-  headaches, difficulty swallowing, tooth/dental problems, sore throat,       No-  sneezing, itching, ear ache, nasal congestion, post nasal drip,  CV:  No-   chest pain, orthopnea, PND, swelling in lower extremities, anasarca,                                                           dizziness, palpitations Resp: No-   shortness of breath with exertion or at rest.              No-   productive cough,  No non-productive cough,  No- coughing up of blood.              No-   change  in color of mucus.  No- wheezing.   Skin: No-   rash or lesions. GI:  No-   heartburn, indigestion, abdominal pain, nausea, vomiting,  GU:  MS:  No-   joint pain or swelling.  Neuro-     nothing unusual Psych:  No- change in mood or affect. No depression or anxiety.  No memory loss.  OBJ- Physical Exam General- Alert, Oriented, Affect-appropriate, Distress- none acute Skin- rash-none, lesions- none, excoriation- none Lymphadenopathy- none Head- atraumatic            Eyes- Gross vision intact, PERRLA, conjunctivae and secretions clear            Ears- Hearing, canals-normal            Nose- Clear, no-Septal dev, mucus, polyps, erosion, perforation             Throat- Mallampati II , mucosa clear , drainage- none, tonsils- atrophic Neck- flexible , trachea midline, no stridor , thyroid nl, carotid no bruit Chest - symmetrical excursion , unlabored           Heart/CV- RRR , no murmur , no gallop  , no rub, nl s1 s2                           -  JVD- none , edema- none, stasis changes- none, varices- none           Lung- clear to P&A, wheeze- none, cough- none , dullness-none, rub- none           Chest wall-  Abd-  Br/ Gen/ Rectal- Not done, not indicated Extrem- cyanosis- none, clubbing, none, atrophy- none, strength- nl, Neuro- grossly intact to observation

## 2017-10-02 NOTE — Assessment & Plan Note (Signed)
Outside pollen season now.  She is comfortably controlled but does ask for refills of Flonase, Astelin, Singulair and she continues allergy vaccine.

## 2017-10-08 ENCOUNTER — Ambulatory Visit (INDEPENDENT_AMBULATORY_CARE_PROVIDER_SITE_OTHER): Payer: 59 | Admitting: *Deleted

## 2017-10-08 DIAGNOSIS — J309 Allergic rhinitis, unspecified: Secondary | ICD-10-CM

## 2017-10-24 ENCOUNTER — Ambulatory Visit (INDEPENDENT_AMBULATORY_CARE_PROVIDER_SITE_OTHER): Payer: 59

## 2017-10-24 DIAGNOSIS — J309 Allergic rhinitis, unspecified: Secondary | ICD-10-CM

## 2017-11-05 ENCOUNTER — Ambulatory Visit (INDEPENDENT_AMBULATORY_CARE_PROVIDER_SITE_OTHER): Payer: 59 | Admitting: *Deleted

## 2017-11-05 DIAGNOSIS — J309 Allergic rhinitis, unspecified: Secondary | ICD-10-CM

## 2017-11-12 ENCOUNTER — Ambulatory Visit (INDEPENDENT_AMBULATORY_CARE_PROVIDER_SITE_OTHER): Payer: 59

## 2017-11-12 DIAGNOSIS — J3081 Allergic rhinitis due to animal (cat) (dog) hair and dander: Secondary | ICD-10-CM | POA: Diagnosis not present

## 2017-11-12 DIAGNOSIS — J309 Allergic rhinitis, unspecified: Secondary | ICD-10-CM

## 2017-11-17 ENCOUNTER — Ambulatory Visit (INDEPENDENT_AMBULATORY_CARE_PROVIDER_SITE_OTHER): Payer: 59

## 2017-11-17 DIAGNOSIS — J309 Allergic rhinitis, unspecified: Secondary | ICD-10-CM

## 2017-11-27 ENCOUNTER — Ambulatory Visit (INDEPENDENT_AMBULATORY_CARE_PROVIDER_SITE_OTHER): Payer: 59

## 2017-11-27 DIAGNOSIS — J309 Allergic rhinitis, unspecified: Secondary | ICD-10-CM

## 2017-12-03 ENCOUNTER — Ambulatory Visit (INDEPENDENT_AMBULATORY_CARE_PROVIDER_SITE_OTHER): Payer: 59

## 2017-12-03 DIAGNOSIS — J309 Allergic rhinitis, unspecified: Secondary | ICD-10-CM | POA: Diagnosis not present

## 2017-12-10 ENCOUNTER — Ambulatory Visit (INDEPENDENT_AMBULATORY_CARE_PROVIDER_SITE_OTHER): Payer: 59 | Admitting: *Deleted

## 2017-12-10 DIAGNOSIS — J309 Allergic rhinitis, unspecified: Secondary | ICD-10-CM

## 2017-12-24 ENCOUNTER — Ambulatory Visit (INDEPENDENT_AMBULATORY_CARE_PROVIDER_SITE_OTHER): Payer: 59

## 2017-12-24 DIAGNOSIS — J309 Allergic rhinitis, unspecified: Secondary | ICD-10-CM | POA: Diagnosis not present

## 2017-12-30 ENCOUNTER — Ambulatory Visit (INDEPENDENT_AMBULATORY_CARE_PROVIDER_SITE_OTHER): Payer: 59 | Admitting: *Deleted

## 2017-12-30 DIAGNOSIS — J309 Allergic rhinitis, unspecified: Secondary | ICD-10-CM | POA: Diagnosis not present

## 2018-01-07 ENCOUNTER — Ambulatory Visit (INDEPENDENT_AMBULATORY_CARE_PROVIDER_SITE_OTHER): Payer: 59 | Admitting: *Deleted

## 2018-01-07 DIAGNOSIS — J309 Allergic rhinitis, unspecified: Secondary | ICD-10-CM

## 2018-01-15 ENCOUNTER — Ambulatory Visit (INDEPENDENT_AMBULATORY_CARE_PROVIDER_SITE_OTHER): Payer: 59

## 2018-01-15 DIAGNOSIS — J309 Allergic rhinitis, unspecified: Secondary | ICD-10-CM

## 2018-01-20 NOTE — Progress Notes (Signed)
VIALS EXP 01-22-19

## 2018-01-21 ENCOUNTER — Ambulatory Visit (INDEPENDENT_AMBULATORY_CARE_PROVIDER_SITE_OTHER): Payer: 59 | Admitting: *Deleted

## 2018-01-21 DIAGNOSIS — J309 Allergic rhinitis, unspecified: Secondary | ICD-10-CM | POA: Diagnosis not present

## 2018-01-26 DIAGNOSIS — J3089 Other allergic rhinitis: Secondary | ICD-10-CM | POA: Diagnosis not present

## 2018-01-27 ENCOUNTER — Ambulatory Visit (INDEPENDENT_AMBULATORY_CARE_PROVIDER_SITE_OTHER): Payer: 59 | Admitting: *Deleted

## 2018-01-27 DIAGNOSIS — J309 Allergic rhinitis, unspecified: Secondary | ICD-10-CM | POA: Diagnosis not present

## 2018-02-02 ENCOUNTER — Ambulatory Visit (INDEPENDENT_AMBULATORY_CARE_PROVIDER_SITE_OTHER): Payer: 59

## 2018-02-02 DIAGNOSIS — J309 Allergic rhinitis, unspecified: Secondary | ICD-10-CM | POA: Diagnosis not present

## 2018-02-20 ENCOUNTER — Ambulatory Visit (INDEPENDENT_AMBULATORY_CARE_PROVIDER_SITE_OTHER): Payer: 59

## 2018-02-20 DIAGNOSIS — J309 Allergic rhinitis, unspecified: Secondary | ICD-10-CM

## 2018-02-24 ENCOUNTER — Ambulatory Visit (INDEPENDENT_AMBULATORY_CARE_PROVIDER_SITE_OTHER): Payer: 59 | Admitting: *Deleted

## 2018-02-24 DIAGNOSIS — J309 Allergic rhinitis, unspecified: Secondary | ICD-10-CM

## 2018-03-03 ENCOUNTER — Ambulatory Visit (INDEPENDENT_AMBULATORY_CARE_PROVIDER_SITE_OTHER): Payer: 59 | Admitting: *Deleted

## 2018-03-03 DIAGNOSIS — J309 Allergic rhinitis, unspecified: Secondary | ICD-10-CM

## 2018-03-11 ENCOUNTER — Encounter: Payer: Self-pay | Admitting: Podiatry

## 2018-03-11 ENCOUNTER — Other Ambulatory Visit: Payer: Self-pay | Admitting: Podiatry

## 2018-03-11 ENCOUNTER — Other Ambulatory Visit: Payer: Self-pay

## 2018-03-11 ENCOUNTER — Ambulatory Visit: Payer: 59 | Admitting: Podiatry

## 2018-03-11 ENCOUNTER — Ambulatory Visit (INDEPENDENT_AMBULATORY_CARE_PROVIDER_SITE_OTHER): Payer: 59

## 2018-03-11 DIAGNOSIS — M7750 Other enthesopathy of unspecified foot: Secondary | ICD-10-CM

## 2018-03-11 DIAGNOSIS — M79671 Pain in right foot: Secondary | ICD-10-CM | POA: Diagnosis not present

## 2018-03-11 DIAGNOSIS — M722 Plantar fascial fibromatosis: Secondary | ICD-10-CM

## 2018-03-11 MED ORDER — TRIAMCINOLONE ACETONIDE 10 MG/ML IJ SUSP
10.0000 mg | Freq: Once | INTRAMUSCULAR | Status: AC
  Administered 2018-03-11: 10 mg

## 2018-03-11 NOTE — Patient Instructions (Signed)

## 2018-03-13 ENCOUNTER — Ambulatory Visit (INDEPENDENT_AMBULATORY_CARE_PROVIDER_SITE_OTHER): Payer: 59

## 2018-03-13 DIAGNOSIS — J309 Allergic rhinitis, unspecified: Secondary | ICD-10-CM | POA: Diagnosis not present

## 2018-03-15 NOTE — Progress Notes (Signed)
Subjective:   Patient ID: Carrie Cox, female   DOB: 73 y.o.   MRN: [id number]   HPI Patient presents stating she has a lot of pain in the heel right and does not remember specific injury she is getting ready to go to [place] for a trip.  Patient does not smoke likes to be active   Review of Systems  All other systems reviewed and are negative.       Objective:  Physical Exam  Constitutional: She appears well-developed and well-nourished.  Cardiovascular: Intact distal pulses.  Pulmonary/Chest: Effort normal.  Musculoskeletal: Normal range of motion.  Neurological: She is alert.  Skin: Skin is warm.  Nursing note and vitals reviewed.   Neurovascular status intact muscle strength is adequate range of motion within normal limits with patient found to have exquisite discomfort plantar aspect right at the insertional point of the tendon the calcaneus with inflammation fluid buildup around the medial band.  Patient was noted to have good digital perfusion and is well oriented x3     Assessment:  Chronic fasciitis plantar aspect right heel at the insertional point of the tendon into the calcaneus     Plan:  H&P condition reviewed and injected the fascia 3 mg Kenalog 5 Milgram Xylocaine and applied fascial brace with instructions on usage.  Given instructions on reduced activity support therapy and reappoint to recheck  X-ray indicates small spur with no indication of stress fracture or arthritis

## 2018-03-17 ENCOUNTER — Ambulatory Visit (INDEPENDENT_AMBULATORY_CARE_PROVIDER_SITE_OTHER): Payer: 59 | Admitting: *Deleted

## 2018-03-17 ENCOUNTER — Other Ambulatory Visit: Payer: Self-pay | Admitting: Allergy and Immunology

## 2018-03-17 DIAGNOSIS — J309 Allergic rhinitis, unspecified: Secondary | ICD-10-CM | POA: Diagnosis not present

## 2018-03-25 ENCOUNTER — Ambulatory Visit: Payer: 59 | Admitting: Podiatry

## 2018-03-25 ENCOUNTER — Ambulatory Visit (INDEPENDENT_AMBULATORY_CARE_PROVIDER_SITE_OTHER): Payer: 59

## 2018-03-25 ENCOUNTER — Encounter: Payer: Self-pay | Admitting: Podiatry

## 2018-03-25 DIAGNOSIS — M79671 Pain in right foot: Secondary | ICD-10-CM | POA: Diagnosis not present

## 2018-03-25 DIAGNOSIS — J309 Allergic rhinitis, unspecified: Secondary | ICD-10-CM

## 2018-03-25 DIAGNOSIS — M7751 Other enthesopathy of right foot: Secondary | ICD-10-CM | POA: Diagnosis not present

## 2018-03-25 DIAGNOSIS — M7752 Other enthesopathy of left foot: Secondary | ICD-10-CM

## 2018-03-25 NOTE — Progress Notes (Signed)
Subjective:   Patient ID: Carrie Cox, female   DOB: 72 y.o.   MRN: [id number]   HPI Patient states that she is having a lot of pain still on her right heel and is worse when she gets up in the morning and after periods of sitting   ROS      Objective:  Physical Exam  Neurovascular status intact with exquisite discomfort plantar aspect right heel at the insertional point tendon calcaneus with a narrow heel     Assessment:  Acute plantar fasciitis right present     Plan:  Recommended utilization of night splint which was dispensed with instructions on ice therapy and scanned for custom orthotics to reduce plantar pressure on the foot

## 2018-03-28 ENCOUNTER — Other Ambulatory Visit: Payer: Self-pay | Admitting: Gynecology

## 2018-03-30 NOTE — Telephone Encounter (Signed)
08/16/2014 CE you wrote "Recurrent yeast infections. Patient has recurrent infections throughout the year that she uses Diflucan 150 mg when necessary. #10 provided for the year."  Current on CE.

## 2018-04-01 ENCOUNTER — Other Ambulatory Visit: Payer: Self-pay | Admitting: Family Medicine

## 2018-04-01 ENCOUNTER — Ambulatory Visit
Admission: RE | Admit: 2018-04-01 | Discharge: 2018-04-01 | Disposition: A | Discharge status: Home / Self Care | Payer: 59 | Source: Ambulatory Visit | Attending: Family Medicine | Admitting: Family Medicine

## 2018-04-01 DIAGNOSIS — R05 Cough: Secondary | ICD-10-CM

## 2018-04-01 DIAGNOSIS — R059 Cough, unspecified: Secondary | ICD-10-CM

## 2018-04-15 ENCOUNTER — Ambulatory Visit (INDEPENDENT_AMBULATORY_CARE_PROVIDER_SITE_OTHER): Payer: 59 | Admitting: *Deleted

## 2018-04-15 DIAGNOSIS — J309 Allergic rhinitis, unspecified: Secondary | ICD-10-CM

## 2018-04-16 ENCOUNTER — Ambulatory Visit: Payer: 59 | Admitting: Podiatry

## 2018-04-16 ENCOUNTER — Ambulatory Visit: Payer: 59 | Admitting: Orthotics

## 2018-04-16 ENCOUNTER — Encounter: Payer: Self-pay | Admitting: Podiatry

## 2018-04-16 DIAGNOSIS — M722 Plantar fascial fibromatosis: Secondary | ICD-10-CM

## 2018-04-16 DIAGNOSIS — M79671 Pain in right foot: Secondary | ICD-10-CM

## 2018-04-16 MED ORDER — TRIAMCINOLONE ACETONIDE 10 MG/ML IJ SUSP
10.0000 mg | Freq: Once | INTRAMUSCULAR | Status: AC
  Administered 2018-04-16: 10 mg

## 2018-04-16 NOTE — Progress Notes (Signed)
Subjective:   Patient ID: Carrie Cox, female   DOB: 72 y.o.   MRN: [id number]   HPI Patient presents stating still having a lot of pain in her right heel mostly on the center outside of the heel and would like some kind of gel also   ROS      Objective:  Physical Exam  Neurovascular status intact with inflammation of more the center and lateral side of the right plantar fascia at the insertion of the tendon into the calcaneus     Assessment:  Plantar fasciitis right with inflammation more on the center lateral side     Plan:  Orthotics dispensed and careful lateral injection administered 3 mg Kenalog 5 mg Xylocaine advised on ice therapy anti-inflammatories and will be seen back again in the next 6 to 8 weeks

## 2018-04-16 NOTE — Progress Notes (Signed)
Patient came in today to p/up functional foot orthotics.   The orthotics were assessed to both fit and function.  The F/O addressed the biomechanical issues/pathologies as intended, offering good longitudinal arch support, proper offloading, and foot support. There weren't any signs of discomfort or irritation.  The F/O fit properly in footwear with minimal trimming/adjustments. 

## 2018-04-20 ENCOUNTER — Telehealth: Payer: Self-pay | Admitting: Podiatry

## 2018-04-20 NOTE — Telephone Encounter (Signed)
I'm a pt of Dr. Mellody Drown and he ordered a topical prescription for pain. He told me or at least the nurse told me it would be mailed to me with in 7 days. Today I received a message on my home phone from a company called [employer]. When I called them back they said they were closed for the day. They wanted to talk me about this medication. First of all, I don't even know what it is called. I'm not sure why they are calling me if they are supposed to be mailing it to me. So I really would like as much information as I can have regarding this prescription. If you wanted to actually call them instead of me that would be fine too. Their number is [phone] and the person that left the message for me was named Estill Bamberg. Please call me back and let me know what is going on because I could really use some relief on this heel pain. The best number to reach me is my work number, [phone]. Thank you so much.

## 2018-04-21 ENCOUNTER — Ambulatory Visit (INDEPENDENT_AMBULATORY_CARE_PROVIDER_SITE_OTHER): Payer: 59 | Admitting: *Deleted

## 2018-04-21 ENCOUNTER — Telehealth: Payer: Self-pay | Admitting: Podiatry

## 2018-04-21 DIAGNOSIS — J309 Allergic rhinitis, unspecified: Secondary | ICD-10-CM | POA: Diagnosis not present

## 2018-04-21 NOTE — Telephone Encounter (Signed)
Left message informing pt [employer] was calling to discuss insurance coverage and delivery information for the medication.

## 2018-04-21 NOTE — Telephone Encounter (Signed)
I'm a pt of Dr. Mellody Drown and I called and left a message yesterday. You can disregard that message as I heard from [facility] with [employer]. I just spoke to her and all she needed was payment information as my insurance does not cover the topical antiinflammatory. We have that squared away so I'm good. Thank you. Bye.

## 2018-04-21 NOTE — Telephone Encounter (Signed)
I informed [employer] pt had received call but did not know what it was about and she still had not received her rx. Charleston Ropes states her co-worker left pt a message to call their office.

## 2018-04-24 ENCOUNTER — Encounter: Payer: Self-pay | Admitting: Gynecology

## 2018-04-28 ENCOUNTER — Ambulatory Visit (INDEPENDENT_AMBULATORY_CARE_PROVIDER_SITE_OTHER): Payer: 59 | Admitting: *Deleted

## 2018-04-28 DIAGNOSIS — J309 Allergic rhinitis, unspecified: Secondary | ICD-10-CM | POA: Diagnosis not present

## 2018-05-12 ENCOUNTER — Ambulatory Visit (INDEPENDENT_AMBULATORY_CARE_PROVIDER_SITE_OTHER): Payer: 59 | Admitting: *Deleted

## 2018-05-12 DIAGNOSIS — J309 Allergic rhinitis, unspecified: Secondary | ICD-10-CM | POA: Diagnosis not present

## 2018-05-14 ENCOUNTER — Encounter: Payer: Self-pay | Admitting: Podiatry

## 2018-05-14 ENCOUNTER — Ambulatory Visit (INDEPENDENT_AMBULATORY_CARE_PROVIDER_SITE_OTHER): Payer: 59 | Admitting: Podiatry

## 2018-05-14 DIAGNOSIS — M722 Plantar fascial fibromatosis: Secondary | ICD-10-CM | POA: Diagnosis not present

## 2018-05-14 NOTE — Progress Notes (Signed)
Subjective:   Patient ID: Carrie Cox, female   DOB: 72 y.o.   MRN: [id number]   HPI Patient states it seems improved but still sore   ROS      Objective:  Physical Exam  Neurovascular status intact with patient right plantar heel improving but still has an area of tenderness within the mid arch area with a narrow thin foot structure noted     Assessment:  Plantar fasciitis improving but still present right     Plan:  I reviewed condition and recommended continued conservative care continued orthotics and night splint usage.  We are going to recheck

## 2018-05-19 ENCOUNTER — Ambulatory Visit (INDEPENDENT_AMBULATORY_CARE_PROVIDER_SITE_OTHER): Payer: 59 | Admitting: *Deleted

## 2018-05-19 DIAGNOSIS — J309 Allergic rhinitis, unspecified: Secondary | ICD-10-CM

## 2018-05-29 ENCOUNTER — Ambulatory Visit (INDEPENDENT_AMBULATORY_CARE_PROVIDER_SITE_OTHER): Payer: 59

## 2018-05-29 DIAGNOSIS — J309 Allergic rhinitis, unspecified: Secondary | ICD-10-CM

## 2018-06-01 ENCOUNTER — Ambulatory Visit: Payer: 59 | Admitting: Gynecology

## 2018-06-01 ENCOUNTER — Encounter: Payer: Self-pay | Admitting: Gynecology

## 2018-06-01 VITALS — BP 140/88

## 2018-06-01 DIAGNOSIS — N898 Other specified noninflammatory disorders of vagina: Secondary | ICD-10-CM

## 2018-06-01 DIAGNOSIS — N3000 Acute cystitis without hematuria: Secondary | ICD-10-CM | POA: Diagnosis not present

## 2018-06-01 LAB — WET PREP FOR TRICH, YEAST, CLUE

## 2018-06-01 MED ORDER — SULFAMETHOXAZOLE-TRIMETHOPRIM 800-160 MG PO TABS: 1.0000 | ORAL_TABLET | Freq: Two times a day (BID) | ORAL | 0 refills | Status: DC

## 2018-06-01 NOTE — Progress Notes (Signed)
    Carrie Cox [birth year] [id number]        71 y.o.  G1P1001 presents with a week's history of worsening urgency, frequency and some hesitancy.  No real discomfort, low back pain fever or chills.  No nausea vomiting diarrhea constipation.  Also notes a little bit of vaginal itching but no discharge or odor.  Uses Replens regularly for vaginal dryness.  Took a Diflucan this morning.  Past medical history,surgical history, problem list, medications, allergies, family history and social history were all reviewed and documented in the EPIC chart.  Directed ROS with pertinent positives and negatives documented in the history of present illness/assessment and plan.  Exam: Caryn Bee assistant Vitals:   06/01/18 1130  BP: 140/88   General appearance:  Normal Spine straight without CVA tenderness Abdomen soft nontender without masses guarding rebound Pelvic external BUS vagina with atrophic changes.  Bimanual without masses or tenderness.  Assessment/Plan:  72 y.o. G1P1001 with history and urine analysis consistent with UTI.  Will treat with Septra DS 1 p.o. twice daily x3 days.  Follow-up precautions reviewed.  Wet prep was negative.  Patient took a Diflucan this morning.  Will monitor symptoms and if her symptoms would persist or worsen she will follow-up for reevaluation.   Anastasio Auerbach MD, 11:38 AM 06/01/2018

## 2018-06-01 NOTE — Addendum Note (Signed)
Addended by: Nelva Nay on: 06/01/2018 11:55 AM   Modules accepted: Orders

## 2018-06-01 NOTE — Patient Instructions (Signed)
Take the Septra antibiotics twice daily for 3 days.  Follow-up if your symptoms persist, worsen or recur.  Follow-up if the vaginal itching persists or worsens.

## 2018-06-03 LAB — URINE CULTURE
MICRO NUMBER:: 90928240
SPECIMEN QUALITY:: ADEQUATE

## 2018-06-03 LAB — URINALYSIS, COMPLETE W/RFL CULTURE
Bilirubin Urine: NEGATIVE
Glucose, UA: NEGATIVE
Hyaline Cast: NONE SEEN /LPF
Ketones, ur: NEGATIVE
NITRITES URINE, INITIAL: NEGATIVE
Protein, ur: NEGATIVE
Specific Gravity, Urine: 1.006 (ref 1.001–1.03)
pH: 5.5 (ref 5.0–8.0)

## 2018-06-03 LAB — CULTURE INDICATED

## 2018-06-09 ENCOUNTER — Ambulatory Visit (INDEPENDENT_AMBULATORY_CARE_PROVIDER_SITE_OTHER): Payer: 59

## 2018-06-09 DIAGNOSIS — J309 Allergic rhinitis, unspecified: Secondary | ICD-10-CM

## 2018-06-12 ENCOUNTER — Ambulatory Visit: Payer: 59 | Admitting: Obstetrics & Gynecology

## 2018-06-12 ENCOUNTER — Encounter: Payer: Self-pay | Admitting: Obstetrics & Gynecology

## 2018-06-12 VITALS — BP 128/84

## 2018-06-12 DIAGNOSIS — N898 Other specified noninflammatory disorders of vagina: Secondary | ICD-10-CM

## 2018-06-12 DIAGNOSIS — N950 Postmenopausal bleeding: Secondary | ICD-10-CM | POA: Diagnosis not present

## 2018-06-12 LAB — WET PREP FOR TRICH, YEAST, CLUE

## 2018-06-12 MED ORDER — CLOBETASOL PROPIONATE 0.05 % EX OINT: 1.0000 "application " | TOPICAL_OINTMENT | Freq: Two times a day (BID) | CUTANEOUS | 0 refills | Status: AC

## 2018-06-12 NOTE — Patient Instructions (Addendum)
1. Vaginal irritation Mild vaginal/vulvar irritation at the posterior fourchette.  Recommend clobetasol ointment 1.27% a thin application on the affected area twice a day for maximum of 2 weeks.  Wet prep and urine analysis both negative. - WET PREP FOR TRICH, YEAST, CLUE - Urinalysis,Complete w/RFL Culture  2. Postmenopausal bleeding Mild vaginal spotting probably secondary to atrophic vaginitis of menopause in spite of using Vagifem.  No other lesions seen on gynecologic exam.  Patient will follow-up for pelvic ultrasound to evaluate the endometrial lining to rule out an endometrial polyp, fibroids, endometrial hyperplasia and endometrial cancer. -Pelvic US, future  3. Vaginal discharge Wet prep negative, patient reassured. - WET PREP FOR [place], YEAST, CLUE  Other orders - clobetasol ointment (TEMOVATE) 0.05 %; Apply 1 application topically 2 (two) times daily for 14 days. Thin vulvar application as needed  Carrie Cox, it was a pleasure meeting you today!  I will inform you of your results as soon as they are available.

## 2018-06-12 NOTE — Progress Notes (Signed)
    Carrie Cox 09-24-1946 [id number]        72 y.o.  G1P1L1 Married  RP: Vaginal/vulvar irritation with brown/red spotting yesterday and this morning  HPI: Had a Cystitis treated with Bactrim 06/01/2018.  No UTI Sx currently.  Sexually active, but no IC x episode of Cystitis.  Using Vagifem twice a week and Replens daily.  Feels irritated in the vagina and at introitus.  No itching or odor.  Had brown spotting on paper when wiping yesterday and this morning saw a little bit of red blood on the Replens applicator.  No pelvic pain.  No fever.   OB History  Gravida Para Term Preterm AB Living  1 1 1     1   SAB TAB Ectopic Multiple Live Births               # Outcome Date GA Lbr Len/2nd Weight Sex Delivery Anes PTL Lv  1 Term             Past medical history,surgical history, problem list, medications, allergies, family history and social history were all reviewed and documented in the EPIC chart.   Directed ROS with pertinent positives and negatives documented in the history of present illness/assessment and plan.  Exam:  Vitals:   06/12/18 1350  BP: 128/84   General appearance:  Normal  Gynecologic exam: Vulva normal, except irritation at post fourchette.  Speculum:  Cervix normal.  Vagina normal.  Increased discharge vs Replens.  Wet prep done.  Wet prep: Negative   U/A: Negative   Assessment/Plan:  72 y.o. G1P1001   1. Vaginal irritation Mild vaginal/vulvar irritation at the posterior fourchette.  Recommend clobetasol ointment 1.09% a thin application on the affected area twice a day for maximum of 2 weeks.  Wet prep and urine analysis both negative. - WET PREP FOR TRICH, YEAST, CLUE - Urinalysis,Complete w/RFL Culture  2. Postmenopausal bleeding Mild vaginal spotting probably secondary to atrophic vaginitis of menopause in spite of using Vagifem.  No other lesions seen on gynecologic exam.  Patient will follow-up for pelvic ultrasound to evaluate the endometrial  lining to rule out an endometrial polyp, fibroids, endometrial hyperplasia and endometrial cancer. -Pelvic US, future  3. Vaginal discharge Wet prep negative, patient reassured. - WET PREP FOR [place], YEAST, CLUE  Other orders - clobetasol ointment (TEMOVATE) 0.05 %; Apply 1 application topically 2 (two) times daily for 14 days. Thin vulvar application as needed  Counseling on above issues and coordination of care more than 50% for 25 minutes.  Princess Bruins MD, 2:04 PM 06/12/2018

## 2018-06-13 LAB — URINALYSIS, COMPLETE W/RFL CULTURE
Bacteria, UA: NONE SEEN /HPF
Bilirubin Urine: NEGATIVE
GLUCOSE, UA: NEGATIVE
Hgb urine dipstick: NEGATIVE
Hyaline Cast: NONE SEEN /LPF
Ketones, ur: NEGATIVE
Leukocyte Esterase: NEGATIVE
NITRITES URINE, INITIAL: NEGATIVE
PROTEIN: NEGATIVE
RBC / HPF: NONE SEEN /HPF (ref 0–2)
SPECIFIC GRAVITY, URINE: 1.004 (ref 1.001–1.03)
WBC, UA: NONE SEEN /HPF (ref 0–5)
pH: 5.5 (ref 5.0–8.0)

## 2018-06-13 LAB — NO CULTURE INDICATED

## 2018-06-23 ENCOUNTER — Ambulatory Visit (INDEPENDENT_AMBULATORY_CARE_PROVIDER_SITE_OTHER): Payer: 59 | Admitting: *Deleted

## 2018-06-23 DIAGNOSIS — J309 Allergic rhinitis, unspecified: Secondary | ICD-10-CM

## 2018-06-24 DIAGNOSIS — J3089 Other allergic rhinitis: Secondary | ICD-10-CM | POA: Diagnosis not present

## 2018-06-24 NOTE — Progress Notes (Signed)
Vials exp 06-25-19

## 2018-06-30 ENCOUNTER — Ambulatory Visit (INDEPENDENT_AMBULATORY_CARE_PROVIDER_SITE_OTHER): Payer: 59

## 2018-06-30 DIAGNOSIS — J309 Allergic rhinitis, unspecified: Secondary | ICD-10-CM | POA: Diagnosis not present

## 2018-07-02 ENCOUNTER — Other Ambulatory Visit: Payer: 59

## 2018-07-02 ENCOUNTER — Ambulatory Visit: Payer: 59 | Admitting: Obstetrics & Gynecology

## 2018-07-02 ENCOUNTER — Encounter: Payer: Self-pay | Admitting: Obstetrics & Gynecology

## 2018-07-02 ENCOUNTER — Other Ambulatory Visit: Payer: Self-pay | Admitting: Obstetrics & Gynecology

## 2018-07-02 ENCOUNTER — Ambulatory Visit (INDEPENDENT_AMBULATORY_CARE_PROVIDER_SITE_OTHER): Payer: 59

## 2018-07-02 DIAGNOSIS — N950 Postmenopausal bleeding: Secondary | ICD-10-CM

## 2018-07-02 DIAGNOSIS — D251 Intramural leiomyoma of uterus: Secondary | ICD-10-CM

## 2018-07-02 NOTE — Progress Notes (Signed)
    Carrie Cox 1946/10/10 [id number]        72 y.o.  G1P1001 Married  RP: Mild PMB for Pelvic US  HPI: Menopause on no hormone replacement therapy except for Vagifem twice a week.  Sexually active with mild spotting after intercourse recently.  Was using Replens every day and for IC.  No vaginal bleeding since last visit.  Has decreased Replens to 3x a week.  Will use Astroglide or Coconut oil for IC.    OB History  Gravida Para Term Preterm AB Living  1 1 1     1   SAB TAB Ectopic Multiple Live Births               # Outcome Date GA Lbr Len/2nd Weight Sex Delivery Anes PTL Lv  1 Term             Past medical history,surgical history, problem list, medications, allergies, family history and social history were all reviewed and documented in the EPIC chart.   Directed ROS with pertinent positives and negatives documented in the history of present illness/assessment and plan.  Exam:  There were no vitals filed for this visit. General appearance:  Normal  Pelvic US today: T/V images.  Anteverted uterus measuring 5.42 x 3.93 x 2.37 cm.  Intramural fibroid measuring 1.7 x 1.5 cm slightly displacing the endometrium.  Endometrium very thin and normal at 2.1 mm.  Right and left ovary normal.  No apparent mass in the right or left adnexa.  No free fluid in the posterior cul-de-sac.   Assessment/Plan:  72 y.o. G1P1001   1. Postmenopausal bleeding Pelvic ultrasound findings reviewed with patient.  A very small intramural fibroid measuring 1.7 cm is present. Endometrial lining thin and normal at 2.1 mm.  Patient reassured.  Will use Astra glide or coconut oil for intercourse.  Will continue Vagifem twice weekly.  Will decrease her usage of Replens to a maximum of 3 times a week.  Counseling on above issues and coordination of care more than 50% for 15 minutes.  Princess Bruins MD, 12:31 PM 07/02/2018

## 2018-07-02 NOTE — Patient Instructions (Signed)
1. Postmenopausal bleeding Pelvic ultrasound findings reviewed with patient.  A very small intramural fibroid measuring 1.7 cm is present. Endometrial lining thin and normal at 2.1 mm.  Patient reassured.  Will use Astra glide or coconut oil for intercourse.  Will continue Vagifem twice weekly.  Will decrease her usage of Replens to a maximum of 3 times a week.  Opal Sidles, good seeing you today!

## 2018-07-08 ENCOUNTER — Ambulatory Visit (INDEPENDENT_AMBULATORY_CARE_PROVIDER_SITE_OTHER): Payer: 59

## 2018-07-08 DIAGNOSIS — J309 Allergic rhinitis, unspecified: Secondary | ICD-10-CM

## 2018-07-15 ENCOUNTER — Ambulatory Visit (INDEPENDENT_AMBULATORY_CARE_PROVIDER_SITE_OTHER): Payer: 59 | Admitting: *Deleted

## 2018-07-15 DIAGNOSIS — J309 Allergic rhinitis, unspecified: Secondary | ICD-10-CM

## 2018-07-22 ENCOUNTER — Ambulatory Visit (INDEPENDENT_AMBULATORY_CARE_PROVIDER_SITE_OTHER): Payer: 59

## 2018-07-22 DIAGNOSIS — J309 Allergic rhinitis, unspecified: Secondary | ICD-10-CM

## 2018-08-12 ENCOUNTER — Ambulatory Visit (INDEPENDENT_AMBULATORY_CARE_PROVIDER_SITE_OTHER): Payer: 59 | Admitting: *Deleted

## 2018-08-12 DIAGNOSIS — J309 Allergic rhinitis, unspecified: Secondary | ICD-10-CM

## 2018-08-18 ENCOUNTER — Ambulatory Visit (INDEPENDENT_AMBULATORY_CARE_PROVIDER_SITE_OTHER): Payer: 59 | Admitting: *Deleted

## 2018-08-18 DIAGNOSIS — J309 Allergic rhinitis, unspecified: Secondary | ICD-10-CM

## 2018-08-28 ENCOUNTER — Ambulatory Visit (INDEPENDENT_AMBULATORY_CARE_PROVIDER_SITE_OTHER): Payer: 59 | Admitting: *Deleted

## 2018-08-28 DIAGNOSIS — J309 Allergic rhinitis, unspecified: Secondary | ICD-10-CM

## 2018-08-28 DIAGNOSIS — R87610 Atypical squamous cells of undetermined significance on cytologic smear of cervix (ASC-US): Secondary | ICD-10-CM

## 2018-08-28 HISTORY — DX: Atypical squamous cells of undetermined significance on cytologic smear of cervix (ASC-US): R87.610

## 2018-09-02 ENCOUNTER — Ambulatory Visit (INDEPENDENT_AMBULATORY_CARE_PROVIDER_SITE_OTHER): Payer: 59 | Admitting: *Deleted

## 2018-09-02 DIAGNOSIS — J309 Allergic rhinitis, unspecified: Secondary | ICD-10-CM

## 2018-09-07 ENCOUNTER — Encounter: Payer: Self-pay | Admitting: Internal Medicine

## 2018-09-07 ENCOUNTER — Ambulatory Visit: Payer: 59 | Admitting: Internal Medicine

## 2018-09-07 VITALS — BP 128/80 | HR 91 | Ht 63.5 in | Wt 148.4 lb

## 2018-09-07 DIAGNOSIS — J4520 Mild intermittent asthma, uncomplicated: Secondary | ICD-10-CM

## 2018-09-07 DIAGNOSIS — J302 Other seasonal allergic rhinitis: Secondary | ICD-10-CM | POA: Diagnosis not present

## 2018-09-07 DIAGNOSIS — J3089 Other allergic rhinitis: Secondary | ICD-10-CM

## 2018-09-07 MED ORDER — FLUTICASONE PROPIONATE 50 MCG/ACT NA SUSP: NASAL | 3 refills | Status: DC

## 2018-09-07 MED ORDER — AZITHROMYCIN 250 MG PO TABS: ORAL_TABLET | ORAL | 0 refills | Status: DC

## 2018-09-07 MED ORDER — METHYLPREDNISOLONE ACETATE 80 MG/ML IJ SUSP
80.0000 mg | Freq: Once | INTRAMUSCULAR | Status: AC
  Administered 2018-09-07: 80 mg via INTRAMUSCULAR

## 2018-09-07 MED ORDER — AZELASTINE HCL 0.1 % NA SOLN: 1.0000 | Freq: Two times a day (BID) | NASAL | 3 refills | Status: DC

## 2018-09-07 MED ORDER — MONTELUKAST SODIUM 10 MG PO TABS: 10.0000 mg | ORAL_TABLET | Freq: Every day | ORAL | 3 refills | Status: DC

## 2018-09-07 NOTE — Progress Notes (Signed)
Subjective:     Patient ID: Carrie Cox, female   DOB: 1946/05/16, 72 y.o.   MRN: [id number]  HPI F never smoker followed  for chronic rhinitis and asthma.   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 On allergy vaccine now from Dr. Neldon Mc Asks for medication refills including Z-Pak to hold, EpiPen to hold, rescue inhaler to hold.  Has not needed rescue inhaler in a couple of years.  Refill for Flonase, Astelin and Singulair are being sent. 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  Asmanex, Flonase, pro-air HFA, Astelin nasal spray Singulair, Claritin Blames housecleaning last night for waking up this morning with puffy eyes and itchy throat.  Very rarely needs rescue inhaler and credits Singulair for good asthma control.  In October had an episode with hand swelling and itching after eating soy which she has tolerated before. She was treated at a walk-in clinic with steroids EpiPen. 10 use daily Claritin and allergy vaccine for dust mite and cat/ Dr Neldon Mc.  ROS-see HPI    + = positive Constitutional:   No-   weight loss, night sweats, fevers, chills, fatigue, lassitude. HEENT:   No-  headaches, difficulty swallowing, tooth/dental problems, sore throat,       No-  sneezing, itching, ear ache, nasal congestion, post nasal drip,  CV:  No-   chest pain, orthopnea, PND, swelling in lower extremities, anasarca,                                                          dizziness, palpitations Resp: No-   shortness of breath with exertion or at rest.              No-   productive cough,  No non-productive cough,  No- coughing up of blood.              No-   change in color of mucus.  No- wheezing.   Skin: No-   rash or lesions. GI:  No-   heartburn, indigestion, abdominal pain, nausea, vomiting,  GU:  MS:  No-   joint pain or swelling.  Neuro-     nothing unusual Psych:  No- change in mood or affect. No depression or anxiety.  No memory loss.  OBJ- Physical Exam General- Alert, Oriented, Affect-appropriate, Distress- none acute Skin- rash-none, lesions- none, excoriation- none Lymphadenopathy- none Head- atraumatic            Eyes- Gross vision intact, PERRLA, conjunctivae and secretions clear            Ears- Hearing, canals-normal            Nose- Clear, no-Septal dev, mucus, polyps, erosion, perforation             Throat- Mallampati II , mucosa clear , drainage- none, tonsils- atrophic Neck- flexible , trachea midline, no stridor , thyroid nl, carotid  no bruit Chest - symmetrical excursion , unlabored           Heart/CV- RRR , no murmur , no gallop  , no rub, nl s1 s2                           - JVD- none , edema- none, stasis changes- none, varices- none           Lung- clear to P&A, wheeze- none, cough- none , dullness-none, rub- none           Chest wall-  Abd-  Br/ Gen/ Rectal- Not done, not indicated Extrem- cyanosis- none, clubbing, none, atrophy- none, strength- nl, Neuro- grossly intact to observation

## 2018-09-07 NOTE — Patient Instructions (Addendum)
Refills sent for singulair, astelin and flonase  You can call for other refills as needed  Script printed for Zpak  Order- depo 80    Dx allergic conjuctivitis

## 2018-09-11 ENCOUNTER — Ambulatory Visit (INDEPENDENT_AMBULATORY_CARE_PROVIDER_SITE_OTHER): Payer: 59

## 2018-09-11 DIAGNOSIS — J309 Allergic rhinitis, unspecified: Secondary | ICD-10-CM

## 2018-09-15 ENCOUNTER — Encounter: Payer: Self-pay | Admitting: Gynecology

## 2018-09-15 ENCOUNTER — Ambulatory Visit: Payer: 59 | Admitting: Gynecology

## 2018-09-15 VITALS — BP 130/84 | Ht 64.0 in | Wt 138.0 lb

## 2018-09-15 DIAGNOSIS — B009 Herpesviral infection, unspecified: Secondary | ICD-10-CM | POA: Diagnosis not present

## 2018-09-15 DIAGNOSIS — N8189 Other female genital prolapse: Secondary | ICD-10-CM

## 2018-09-15 DIAGNOSIS — N952 Postmenopausal atrophic vaginitis: Secondary | ICD-10-CM

## 2018-09-15 DIAGNOSIS — Z01419 Encounter for gynecological examination (general) (routine) without abnormal findings: Secondary | ICD-10-CM

## 2018-09-15 DIAGNOSIS — M8580 Other specified disorders of bone density and structure, unspecified site: Secondary | ICD-10-CM | POA: Diagnosis not present

## 2018-09-15 DIAGNOSIS — Z9289 Personal history of other medical treatment: Secondary | ICD-10-CM

## 2018-09-15 DIAGNOSIS — R252 Cramp and spasm: Secondary | ICD-10-CM

## 2018-09-15 MED ORDER — ESTRADIOL 10 MCG VA TABS: ORAL_TABLET | VAGINAL | 4 refills | Status: DC

## 2018-09-15 MED ORDER — VALACYCLOVIR HCL 1 G PO TABS: 1000.0000 mg | ORAL_TABLET | Freq: Every day | ORAL | 2 refills | Status: DC | PRN

## 2018-09-15 NOTE — Progress Notes (Signed)
    Carrie Cox [birth year] [id number]        72 y.o.  G1P1001 for annual gynecologic exam.  Doing well without gynecologic complaints  Past medical history,surgical history, problem list, medications, allergies, family history and social history were all reviewed and documented as reviewed in the EPIC chart.  ROS:  Performed with pertinent positives and negatives included in the history, assessment and plan.   Additional significant findings : None   Exam: Caryn Bee assistant Vitals:   09/15/18 0911  BP: 130/84  Weight: 138 lb (62.6 kg)  Height: 5\' 4"  (1.626 m)   Body mass index is 23.69 kg/m.  General appearance:  Normal affect, orientation and appearance. Skin: Grossly normal HEENT: Without gross lesions.  No cervical or supraclavicular adenopathy. Thyroid normal.  Lungs:  Clear without wheezing, rales or rhonchi Cardiac: RR, without RMG Abdominal:  Soft, nontender, without masses, guarding, rebound, organomegaly or hernia Breasts:  Examined lying and sitting without masses, retractions, discharge or axillary adenopathy. Pelvic:  Ext, BUS, Vagina: With atrophic changes.  Mild cystocele and uterine prolapse noted.  Cervix: With atrophic changes.  Pap smear done  Uterus: Anteverted, normal size, shape and contour, midline and mobile nontender   Adnexa: Without masses or tenderness    Anus and perineum: Normal   Rectovaginal: Normal sphincter tone without palpated masses or tenderness.    Assessment/Plan:  72 y.o. G48P1001 female for annual gynecologic exam.   1. Postmenopausal/atrophic genital changes.  Continues with Vagifem 10 mcg twice weekly.  Was using it 3 times weekly but now twice and does well with this.  Wants to continue.  No bleeding.  With discussed the risks/benefits multiple times to include absorption with systemic effects.  Refill x1 year provided. 2. Osteopenia.  DEXA 2017 T score -1.8.  Schedule follow-up DEXA now at 2-year interval. 3. Pap smear 2016.   Pap smear done today.  No history of abnormal Pap smears.  Options to stop screening per current screening guidelines based on age reviewed. 4. Colonoscopy 2018.  Repeat at their recommended interval. 5. Mammography 03/2018.  Continue with annual mammography when due.  Breast exam normal today. 6. Pelvic relaxation.  Mild cystocele and uterine prolapse.  Asymptomatic to the patient.  Stable on serial exams. 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  Refill x1 year provided. 8. Muscle cramps.  Patient notes starting to have leg cramps at night.  Discussed various possibilities with patient.  Will check baseline comprehensive metabolic panel.  Will follow-up with primary physician if continues to be an issue. 9. Health maintenance.  No routine lab work done as patient does this elsewhere.  Follow-up 1 year, sooner as needed.   Anastasio Auerbach MD, 9:42 AM 09/15/2018

## 2018-09-15 NOTE — Patient Instructions (Signed)
Followup for bone density as scheduled. 

## 2018-09-15 NOTE — Addendum Note (Signed)
Addended by: Nelva Nay on: 09/15/2018 10:02 AM   Modules accepted: Orders

## 2018-09-16 ENCOUNTER — Encounter: Payer: Self-pay | Admitting: Gynecology

## 2018-09-16 LAB — COMPREHENSIVE METABOLIC PANEL
AG Ratio: 2.1 (calc) (ref 1.0–2.5)
ALBUMIN MSPROF: 4.4 g/dL (ref 3.6–5.1)
ALT: 16 U/L (ref 6–29)
AST: 20 U/L (ref 10–35)
Alkaline phosphatase (APISO): 51 U/L (ref 33–130)
BUN: 15 mg/dL (ref 7–25)
CO2: 29 mmol/L (ref 20–32)
Calcium: 9.3 mg/dL (ref 8.6–10.4)
Chloride: 103 mmol/L (ref 98–110)
Creat: 0.81 mg/dL (ref 0.60–0.93)
GLUCOSE: 90 mg/dL (ref 65–99)
Globulin: 2.1 g/dL (calc) (ref 1.9–3.7)
Potassium: 4.1 mmol/L (ref 3.5–5.3)
SODIUM: 139 mmol/L (ref 135–146)
TOTAL PROTEIN: 6.5 g/dL (ref 6.1–8.1)
Total Bilirubin: 0.7 mg/dL (ref 0.2–1.2)

## 2018-09-18 NOTE — Telephone Encounter (Signed)
Patient informed with my chart message. 

## 2018-09-19 LAB — PAP IG W/ RFLX HPV ASCU

## 2018-09-19 LAB — HUMAN PAPILLOMAVIRUS, HIGH RISK: HPV DNA High Risk: NOT DETECTED

## 2018-09-21 ENCOUNTER — Encounter: Payer: Self-pay | Admitting: Gynecology

## 2018-10-02 ENCOUNTER — Ambulatory Visit: Payer: 59 | Admitting: Internal Medicine

## 2018-10-12 ENCOUNTER — Ambulatory Visit (INDEPENDENT_AMBULATORY_CARE_PROVIDER_SITE_OTHER): Payer: 59 | Admitting: *Deleted

## 2018-10-12 DIAGNOSIS — J309 Allergic rhinitis, unspecified: Secondary | ICD-10-CM

## 2018-10-15 NOTE — Progress Notes (Signed)
VIALS EXP 10-20-19 

## 2018-10-25 NOTE — Assessment & Plan Note (Signed)
And exacerbation blamed on cleaning house.  She remains on antihistamines and is followed by her allergist.  She does asked to continue holding a prescription for Z-Pak as discussed.

## 2018-10-25 NOTE — Assessment & Plan Note (Signed)
Mild intermittent asthma continues under good control.  She has inhaler if needed but believes Singulair really helps her.

## 2018-10-26 ENCOUNTER — Ambulatory Visit (INDEPENDENT_AMBULATORY_CARE_PROVIDER_SITE_OTHER): Payer: 59 | Admitting: *Deleted

## 2018-10-26 DIAGNOSIS — J309 Allergic rhinitis, unspecified: Secondary | ICD-10-CM

## 2018-10-27 ENCOUNTER — Ambulatory Visit (INDEPENDENT_AMBULATORY_CARE_PROVIDER_SITE_OTHER): Payer: 59

## 2018-10-27 ENCOUNTER — Other Ambulatory Visit: Payer: Self-pay | Admitting: Gynecology

## 2018-10-27 DIAGNOSIS — M8589 Other specified disorders of bone density and structure, multiple sites: Secondary | ICD-10-CM

## 2018-10-27 DIAGNOSIS — J3081 Allergic rhinitis due to animal (cat) (dog) hair and dander: Secondary | ICD-10-CM

## 2018-10-27 DIAGNOSIS — M8580 Other specified disorders of bone density and structure, unspecified site: Secondary | ICD-10-CM

## 2018-10-27 DIAGNOSIS — Z780 Asymptomatic menopausal state: Secondary | ICD-10-CM

## 2018-10-29 ENCOUNTER — Encounter: Payer: Self-pay | Admitting: Gynecology

## 2018-10-29 NOTE — Telephone Encounter (Signed)
Pap had some atypia and you recommended repeat in one year.

## 2018-10-29 NOTE — Telephone Encounter (Signed)
An endometrial biopsy is testing for abnormalities within the lining of the uterus.  The Pap smear that showed mild atypia is checking the cervix.  Any follow-up as far as the abnormal Pap smear would be a colposcopy which looks closer at the cervix and not the endometrial biopsy.  If the patient is nervous about waiting a year because of the abnormal Pap smear then she can schedule a colposcopy appointment.

## 2018-11-09 ENCOUNTER — Encounter: Payer: Self-pay | Admitting: Gynecology

## 2018-11-09 ENCOUNTER — Ambulatory Visit (INDEPENDENT_AMBULATORY_CARE_PROVIDER_SITE_OTHER): Payer: 59 | Admitting: Gynecology

## 2018-11-09 VITALS — BP 124/84

## 2018-11-09 DIAGNOSIS — N870 Mild cervical dysplasia: Secondary | ICD-10-CM

## 2018-11-09 DIAGNOSIS — R87610 Atypical squamous cells of undetermined significance on cytologic smear of cervix (ASC-US): Secondary | ICD-10-CM

## 2018-11-09 NOTE — Progress Notes (Signed)
    Carrie Cox 08/26/1946 [id number]        72 y.o.  G1P1001 presents for colposcopy.  First abnormal Pap smear showed ASCUS negative high risk HPV.  Recommendation was to repeat her Pap smear in 1 year.  The patient was uncomfortable with this recommendation and wanted a more complete evaluation.  Past medical history,surgical history, problem list, medications, allergies, family history and social history were all reviewed and documented in the EPIC chart.  Directed ROS with pertinent positives and negatives documented in the history of present illness/assessment and plan.  Exam: Caryn Bee assistant Vitals:   11/09/18 1151  BP: 124/84   General appearance:  Normal Abdomen soft nontender without masses guarding rebound Pelvic external BUS vagina with atrophic changes.  Cervix with atrophic changes.  Uterus normal size midline mobile nontender.  Adnexa without masses or tenderness.  Colposcopy performed after acetic acid cleanse was inadequate.  Transformation zone not visualized 360 degrees.  No abnormalities were seen.  Cervix was gently dilated with a disposable dilator and an ECC was performed.  Patient tolerated well.  Physical Exam  Genitourinary:        Assessment/Plan:  73 y.o. G1P1001 with first abnormal Pap smear showing ASCUS negative high risk HPV.  Patient felt uncomfortable with observation and follow-up Pap smear in 1 year.  Colposcopy was normal/inadequate.  ECC performed.  Patient will follow-up for results.  Assuming negative then plan repeat Pap smear 1 year.  If otherwise then will triaged based upon results.    Anastasio Auerbach MD, 12:09 PM 11/09/2018

## 2018-11-09 NOTE — Patient Instructions (Signed)
Office will call you with biopsy results 

## 2018-11-10 LAB — PATHOLOGY

## 2018-11-10 LAB — TISSUE SPECIMEN

## 2018-11-11 ENCOUNTER — Encounter: Payer: Self-pay | Admitting: Gynecology

## 2018-11-11 ENCOUNTER — Ambulatory Visit (INDEPENDENT_AMBULATORY_CARE_PROVIDER_SITE_OTHER): Payer: 59 | Admitting: *Deleted

## 2018-11-11 DIAGNOSIS — J309 Allergic rhinitis, unspecified: Secondary | ICD-10-CM | POA: Diagnosis not present

## 2018-11-11 NOTE — Telephone Encounter (Signed)
That is a generic reading they put with any atypical findings on the Pap smear/biopsies.  They assume most atypical changes of the cervix are related to HPV.  There are over 200 subtypes of HPV and it is impossible to screen for all of the subtypes.  You can screen for 14 of subtypes and if positive you can say you have 1 of those subtypes.  If negative it does not guarantee you do not have any of the other subtypes.  HPV is considered an STD.  One study showed 70% of sexually active teenagers have HPV.  The usual course is that the body will get rid of the virus after exposure which may take several years.  When we discover HPV in older patients, remembering that we are not saying the patient definitely has it, then 2 options are that she was exposed relatively recently over the past several years or that she could be 1 of those patients who will carry the virus her whole life, although unusual.

## 2018-11-16 ENCOUNTER — Ambulatory Visit (INDEPENDENT_AMBULATORY_CARE_PROVIDER_SITE_OTHER): Payer: 59

## 2018-11-16 DIAGNOSIS — J309 Allergic rhinitis, unspecified: Secondary | ICD-10-CM

## 2018-11-24 ENCOUNTER — Ambulatory Visit (INDEPENDENT_AMBULATORY_CARE_PROVIDER_SITE_OTHER): Payer: 59 | Admitting: *Deleted

## 2018-11-24 DIAGNOSIS — J309 Allergic rhinitis, unspecified: Secondary | ICD-10-CM

## 2018-12-01 ENCOUNTER — Ambulatory Visit (INDEPENDENT_AMBULATORY_CARE_PROVIDER_SITE_OTHER): Payer: 59 | Admitting: *Deleted

## 2018-12-01 DIAGNOSIS — J309 Allergic rhinitis, unspecified: Secondary | ICD-10-CM

## 2018-12-01 DIAGNOSIS — M25561 Pain in right knee: Secondary | ICD-10-CM | POA: Insufficient documentation

## 2018-12-15 ENCOUNTER — Ambulatory Visit (INDEPENDENT_AMBULATORY_CARE_PROVIDER_SITE_OTHER): Payer: 59 | Admitting: *Deleted

## 2018-12-15 DIAGNOSIS — J309 Allergic rhinitis, unspecified: Secondary | ICD-10-CM | POA: Diagnosis not present

## 2018-12-16 ENCOUNTER — Other Ambulatory Visit: Payer: Self-pay | Admitting: Gynecology

## 2018-12-17 ENCOUNTER — Telehealth: Payer: Self-pay | Admitting: *Deleted

## 2018-12-17 MED ORDER — FLUCONAZOLE 150 MG PO TABS: ORAL_TABLET | ORAL | 0 refills | Status: DC

## 2018-12-17 NOTE — Telephone Encounter (Signed)
Okay for Diflucan 150 mg 1 p.o. with symptoms of yeast #5 no refill

## 2018-12-17 NOTE — Telephone Encounter (Signed)
Patient called c/o yeast infection has been on antibiotic for sinus infection, takes probiotic to help prevent yeast, however still having itching, slight discharge. She asked if couple of diflucan tablets could be prescribed with refill? Please advise

## 2018-12-17 NOTE — Telephone Encounter (Signed)
Patient informed, Rx sent.  

## 2018-12-23 ENCOUNTER — Ambulatory Visit (INDEPENDENT_AMBULATORY_CARE_PROVIDER_SITE_OTHER): Payer: 59

## 2018-12-23 DIAGNOSIS — J309 Allergic rhinitis, unspecified: Secondary | ICD-10-CM

## 2018-12-30 ENCOUNTER — Ambulatory Visit (INDEPENDENT_AMBULATORY_CARE_PROVIDER_SITE_OTHER): Payer: 59 | Admitting: *Deleted

## 2018-12-30 DIAGNOSIS — J309 Allergic rhinitis, unspecified: Secondary | ICD-10-CM

## 2019-01-19 ENCOUNTER — Ambulatory Visit (INDEPENDENT_AMBULATORY_CARE_PROVIDER_SITE_OTHER): Payer: 59 | Admitting: Allergy and Immunology

## 2019-01-19 ENCOUNTER — Encounter: Payer: Self-pay | Admitting: Allergy and Immunology

## 2019-01-19 ENCOUNTER — Other Ambulatory Visit: Payer: Self-pay

## 2019-01-19 VITALS — BP 146/70 | HR 94 | Temp 98.3°F | Resp 16 | Ht 64.0 in | Wt 147.0 lb

## 2019-01-19 DIAGNOSIS — J3089 Other allergic rhinitis: Secondary | ICD-10-CM | POA: Diagnosis not present

## 2019-01-19 DIAGNOSIS — L2089 Other atopic dermatitis: Secondary | ICD-10-CM

## 2019-01-19 DIAGNOSIS — H1010 Acute atopic conjunctivitis, unspecified eye: Secondary | ICD-10-CM

## 2019-01-19 DIAGNOSIS — T7802XD Anaphylactic reaction due to shellfish (crustaceans), subsequent encounter: Secondary | ICD-10-CM

## 2019-01-19 DIAGNOSIS — Z91038 Other insect allergy status: Secondary | ICD-10-CM

## 2019-01-19 DIAGNOSIS — Z91030 Bee allergy status: Secondary | ICD-10-CM

## 2019-01-19 NOTE — Progress Notes (Signed)
[facility] [place] [facility] [place]   Follow-up Note  Referring Provider: No ref. provider found Primary Provider: Darcus Austin, MD (Inactive) Date of Office Visit: 01/19/2019  Subjective:   Carrie Cox (DOB: 08-Nov-1945) is a 73 y.o. female who returns to the [facility] on 01/19/2019 in re-evaluation of the following:  HPI: Carrie Cox returns to this clinic in reevaluation of her allergic rhinoconjunctivitis and hymenoptera venom hypersensitivity and shellfish allergy..  I last saw her in this clinic on 29 April 2017.  She has been using a course of aeroallergen immunotherapy currently every 2 weeks which is going quite well without any significant adverse effect which has helped both her eye and her nose issue significantly.  However, she has noticed over the course the past week or so that the skin around her eyes has become a little bit irritated and somewhat scaly.  Otherwise, she has very little issues with her nose or her eyes.  It does not sound as though she has required a systemic steroid or antibiotic to treat any type of airway issue.  She remains away from consuming any shellfish.  She makes her best attempt to avoid hymenoptera venom exposure.  She continues to carry an EpiPen.  She did obtain a flu vaccine this year.  Allergies as of 01/19/2019      Reactions   Bee Venom Anaphylaxis   Allergic to yellow jackets   Shellfish Allergy Anaphylaxis   Aloe    Red rash (sunburn type)   Codeine Nausea And Vomiting   Doxycycline    Stomach pain   Erythromycin    Stomach pain   Nsaids Nausea Only   Terrible pains in stomach   Other    Pain meds in general --Nausea and vomiting Artificial Sweetners- vomiting and swelling Anti-inflammatories - "can't take oral anti-inflammatories it hurts my stomach - Nausea and stabbing stomach pain" [place]-Severe Rash/hives   Soy Allergy Hives      Medication List      albuterol 108 (90 Base)  MCG/ACT inhaler Commonly known as:  ProAir HFA Inhale two puffs every four to six hours as needed for cough or wheeze.   ALPRAZolam 0.25 MG tablet Commonly known as:  XANAX Take 0.25 mg by mouth at bedtime as needed for anxiety.   azelastine 0.1 % nasal spray Commonly known as:  ASTELIN Place 1 spray into both nostrils 2 (two) times daily.   BENADRYL PO Take by mouth as needed.   carisoprodol 350 MG tablet Commonly known as:  SOMA Take 350 mg by mouth 4 (four) times daily as needed for muscle spasms.   EPINEPHrine 0.3 mg/0.3 mL Soaj injection Commonly known as:  EpiPen 2-Pak Inject into thigh for severe allergic reaction   escitalopram 10 MG tablet Commonly known as:  LEXAPRO Take 10 mg by mouth daily.   Estradiol 10 MCG Tabs vaginal tablet Commonly known as:  Yuvafem INSERT 1 TABLET VAGINALLY TWICE TIMES A WEEK   fluconazole 150 MG tablet Commonly known as:  DIFLUCAN Take one tablet by mouth daily only with yeast symptoms.   fluticasone 50 MCG/ACT nasal spray Commonly known as:  FLONASE 1-2 puffs each nostril once daily as needed   loratadine 10 MG tablet Commonly known as:  CLARITIN Take 10 mg by mouth daily as needed for allergies.   montelukast 10 MG tablet Commonly known as:  SINGULAIR Take 1 tablet (10 mg total) by mouth daily.   NON FORMULARY  [facility]  Anti-Inflammatory Cream- Diclofenac 3%, Baclofen 2%, Lidocaine 2% Apply 1-2 grams to affected area 3-4 times daily Qty. 120 gm 3 refills   pseudoephedrine 30 MG tablet Commonly known as:  SUDAFED Take 30 mg by mouth as needed for congestion.   REPLENS VA Place vaginally.   valACYclovir 1000 MG tablet Commonly known as:  VALTREX Take 1 tablet (1,000 mg total) by mouth daily as needed.   VITAMIN D PO Take 2,000 Units by mouth daily.       Past Medical History:  Diagnosis Date  . ASCUS of cervix with negative high risk HPV 08/2018   Colposcopy ECC showed LGSIL  . Asthma   .  Atrophic vaginitis    uses vagifem 3 X week to  . HSV infection   . Multiple allergies   . Neck injury   . Osteopenia 09/2018   T score -1.2 FRAX 9% / 1%.  Stable from prior DEXA    Past Surgical History:  Procedure Laterality Date  . ADENOIDECTOMY  1955  . CARPAL TUNNEL RELEASE Right 08/2016  . FOOT SURGERY     BILATERAL ORTHO  . LAPAROSCOPIC APPENDECTOMY N/A 03/04/2013   Procedure: APPENDECTOMY LAPAROSCOPIC;  Surgeon: Shann Medal, MD;  Location: [facility];  Service: General;  Laterality: N/A;  . TONSILLECTOMY     and adenoids  . TONSILLECTOMY  1955    Review of systems negative except as noted in HPI / PMHx or noted below:  Review of Systems  Constitutional: Negative.   HENT: Negative.   Eyes: Negative.   Respiratory: Negative.   Cardiovascular: Negative.   Gastrointestinal: Negative.   Genitourinary: Negative.   Musculoskeletal: Negative.   Skin: Negative.   Neurological: Negative.   Endo/Heme/Allergies: Negative.   Psychiatric/Behavioral: Negative.      Objective:   Vitals:   01/19/19 1723  BP: (!) 146/70  Pulse: 94  Resp: 16  Temp: 98.3 F (36.8 C)  SpO2: 97%   Height: 5\' 4"  (162.6 cm)  Weight: 147 lb (66.7 kg)   Physical Exam Constitutional:      Appearance: She is not diaphoretic.  HENT:     Head: Normocephalic.     Right Ear: Tympanic membrane, ear canal and external ear normal.     Left Ear: Tympanic membrane, ear canal and external ear normal.     Nose: Nose normal. No mucosal edema or rhinorrhea.     Mouth/Throat:     Pharynx: Uvula midline. No oropharyngeal exudate.  Eyes:     Conjunctiva/sclera: Conjunctivae normal.  Neck:     Thyroid: No thyromegaly.     Trachea: Trachea normal. No tracheal tenderness or tracheal deviation.  Cardiovascular:     Rate and Rhythm: Normal rate and regular rhythm.     Heart sounds: Normal heart sounds, S1 normal and S2 normal. No murmur.  Pulmonary:     Effort: No respiratory distress.     Breath  sounds: Normal breath sounds. No stridor. No wheezing or rales.  Lymphadenopathy:     Head:     Right side of head: No tonsillar adenopathy.     Left side of head: No tonsillar adenopathy.     Cervical: No cervical adenopathy.  Skin:    Findings: Rash (Slight periorbital erythema without swelling) present. No erythema.     Nails: There is no clubbing.   Neurological:     Mental Status: She is alert.     Diagnostics: none  Assessment and Plan:  1. Perennial allergic rhinitis   2. Seasonal allergic conjunctivitis   3. Other atopic dermatitis   4. Anaphylactic shock due to shellfish, subsequent encounter   5. History of systemic reaction to hymenoptera     1. Continue to Perform allergen avoidance measures  2. Continue Treat and prevent inflammation:   A. montelukast 10 mg tablet 1 time per day  B. Flonase 1-2 sprays each nostril one time per day  3. If needed:   A. Azelastine 1-2 sprays each nostril 1-2 times per day  B. OTC antihistamine - Zyrtec/Claritin/Allegra  C. nasal saline spray  D. EpiPen, Benadryl, M.D./ER evaluation for allergic reaction  E. Proventil HFA 2 puffs every 4-6 hours  F. Pataday one drop each eye one time per day  G. OTC Hydrocortisone 1% to skin 1-2 times per day  4. Continue immunotherapy  5. Return to clinic in 12 months or earlier if problem  Overall Sabrea has done very well on her current therapy and she will continue on montelukast and some dose of Flonase and she is certainly welcome to utilize a large collection of other medications as noted above on an as-needed basis.  I will see her back in this clinic in 12 months or earlier if there is a problem.  Allena Katz, MD Allergy / Immunology [place]

## 2019-01-19 NOTE — Patient Instructions (Addendum)
  1. Continue to Perform allergen avoidance measures  2. Continue Treat and prevent inflammation:   A. montelukast 10 mg tablet 1 time per day  B. Flonase 1-2 sprays each nostril one time per day  3. If needed:   A. Azelastine 1-2 sprays each nostril 1-2 times per day  B. OTC antihistamine - Zyrtec/Claritin/Allegra  C. nasal saline spray  D. EpiPen, Benadryl, M.D./ER evaluation for allergic reaction  E. Proventil HFA 2 puffs every 4-6 hours  F. Pataday one drop each eye one time per day  G. OTC Hydrocortisone 1% to skin 1-2 times per day  4. Continue immunotherapy  5. Return to clinic in 12 months or earlier if problem

## 2019-01-20 ENCOUNTER — Encounter: Payer: Self-pay | Admitting: Allergy and Immunology

## 2019-02-15 ENCOUNTER — Ambulatory Visit (INDEPENDENT_AMBULATORY_CARE_PROVIDER_SITE_OTHER): Payer: 59

## 2019-02-15 DIAGNOSIS — J309 Allergic rhinitis, unspecified: Secondary | ICD-10-CM | POA: Diagnosis not present

## 2019-02-23 ENCOUNTER — Ambulatory Visit (INDEPENDENT_AMBULATORY_CARE_PROVIDER_SITE_OTHER): Payer: 59 | Admitting: *Deleted

## 2019-02-23 DIAGNOSIS — J309 Allergic rhinitis, unspecified: Secondary | ICD-10-CM

## 2019-02-23 NOTE — Progress Notes (Signed)
Vials exp 02-23-2020

## 2019-02-24 ENCOUNTER — Telehealth: Payer: Self-pay | Admitting: Internal Medicine

## 2019-02-24 ENCOUNTER — Other Ambulatory Visit: Payer: Self-pay | Admitting: Internal Medicine

## 2019-02-24 DIAGNOSIS — J3081 Allergic rhinitis due to animal (cat) (dog) hair and dander: Secondary | ICD-10-CM | POA: Diagnosis not present

## 2019-02-24 MED ORDER — PREDNISONE 10 MG PO TABS: ORAL_TABLET | ORAL | 0 refills | Status: DC

## 2019-02-24 MED ORDER — AZITHROMYCIN 250 MG PO TABS: ORAL_TABLET | ORAL | 0 refills | Status: DC

## 2019-02-24 NOTE — Telephone Encounter (Signed)
Primary Pulmonologist: Carrie Cox Last office visit and with whom: Carrie Cox on 09/07/18 What do we see them for (pulmonary problems): seasonal allergic rhinitis and Asthma  Last OV assessment/plan:  Versions: 1. Deneise Lever, MD (Physician) at 09/07/2018 2:20 PM - Signed    Refills sent for singulair, astelin and flonase  You can call for other refills as needed  Script printed for Zpak  Order- depo 80    Dx allergic conjuctivitis     Was appointment offered to patient (explain)?  Yes, patient refused to come in office.   Reason for call: patient reports yesterday a productive cough-green, some chest tightness, and some wheezing. Pt is taking regular allergy medication and is requesting prednisone taper and Z-Pak. Pt reports no fever, no chills, no body aches, no sinus problems, been around no one sick or with COVID-19 and has not traveled in months.  Carrie Cox please advise. Thank you  Allergies  Allergen Reactions  . 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 Shellfish Allergy Anaphylaxis  . Aloe     Red rash (sunburn type)  . Codeine Nausea And Vomiting  . Doxycycline     Stomach pain  . Erythromycin     Stomach pain  . Nsaids Nausea Only    Terrible pains in stomach  . Other     Pain meds in general --Nausea and vomiting Artificial Sweetners- vomiting and swelling Anti-inflammatories - "can't take oral anti-inflammatories it hurts my stomach - Nausea and stabbing stomach pain"    [place]-Severe Rash/hives  . Soy Allergy Hives   Current Outpatient Medications on File Prior to Visit  Medication Sig Dispense Refill  . albuterol (PROAIR HFA) 108 (90 Base) MCG/ACT inhaler Inhale two puffs every four to six hours as needed for cough or wheeze. 18 g 12  . ALPRAZolam (XANAX) 0.25 MG tablet Take 0.25 mg by mouth at bedtime as needed for anxiety.     Marland Kitchen azelastine (ASTELIN) 0.1 % nasal spray Place 1 spray into both nostrils 2 (two) times daily. 90 mL 3  . carisoprodol  (SOMA) 350 MG tablet Take 350 mg by mouth 4 (four) times daily as needed for muscle spasms.    . Cholecalciferol (VITAMIN D PO) Take 2,000 Units by mouth daily.    . DiphenhydrAMINE HCl (BENADRYL PO) Take by mouth as needed.    Marland Kitchen EPINEPHrine (EPIPEN 2-PAK) 0.3 mg/0.3 mL IJ SOAJ injection Inject into thigh for severe allergic reaction 1 Device prn  . escitalopram (LEXAPRO) 10 MG tablet Take 10 mg by mouth daily.     . Estradiol (YUVAFEM) 10 MCG TABS vaginal tablet INSERT 1 TABLET VAGINALLY TWICE TIMES A WEEK 48 tablet 4  . fluconazole (DIFLUCAN) 150 MG tablet Take one tablet by mouth daily only with yeast symptoms. 5 tablet 0  . fluticasone (FLONASE) 50 MCG/ACT nasal spray 1-2 puffs each nostril once daily as needed 48 g 3  . loratadine (CLARITIN) 10 MG tablet Take 10 mg by mouth daily as needed for allergies.    . montelukast (SINGULAIR) 10 MG tablet Take 1 tablet (10 mg total) by mouth daily. 90 tablet 3  . NON FORMULARY [facility]  Anti-Inflammatory Cream- Diclofenac 3%, Baclofen 2%, Lidocaine 2% Apply 1-2 grams to affected area 3-4 times daily Qty. 120 gm 3 refills    . pseudoephedrine (SUDAFED) 30 MG tablet Take 30 mg by mouth as needed for congestion.    . Vaginal Lubricant (REPLENS VA) Place vaginally.    Marland Kitchen  valACYclovir (VALTREX) 1000 MG tablet Take 1 tablet (1,000 mg total) by mouth daily as needed. 30 tablet 2   No current facility-administered medications on file prior to visit.

## 2019-02-24 NOTE — Telephone Encounter (Signed)
Ok Zpak 250 mg, # 6, 2 today then one daily        Prednisone 10 mg, # 20, 4 X 2 DAYS, 3 X 2 DAYS, 2 X 2 DAYS, 1 X 2 DAYS  I hope these help

## 2019-02-24 NOTE — Telephone Encounter (Signed)
Called and spoke with patient regarding CY recommendations below Placed order for Zpak 250mg  #6; 2 today and then one daily until complete And placed order for Prednisone 10 mg, # 20, 4 X 2 DAYS, 3 X 2 DAYS, 2 X 2 DAYS, 1 X 2 DAYS Pt verbalized and expressed understanding Nothing further needed

## 2019-03-08 IMAGING — CR DG CHEST 2V
2 series · 2 of 2 positions shown · non-contrast
Comparison: None.

CLINICAL DATA: Cough, fever x1 week

EXAM:
CHEST - 2 VIEW

[w chest pa]
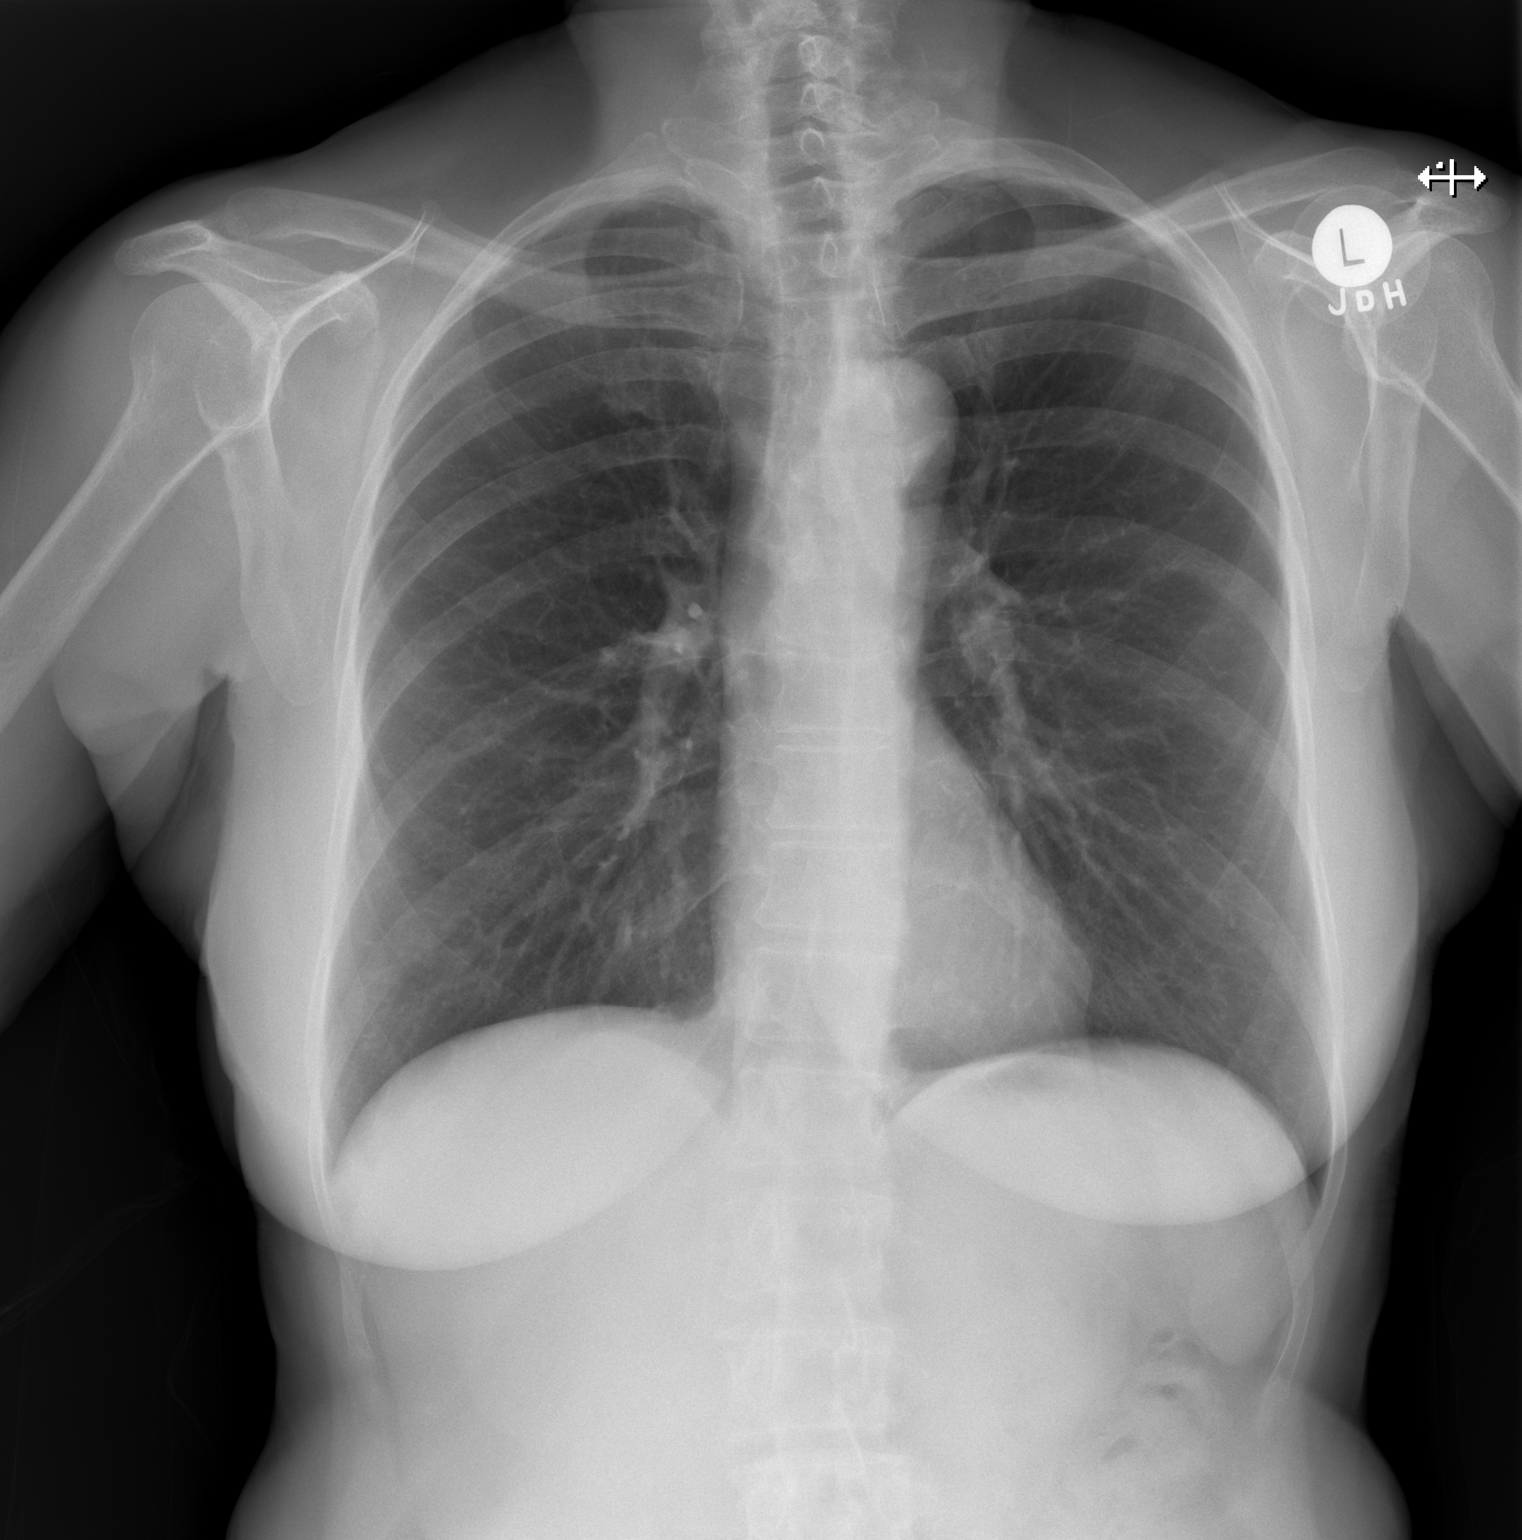

[w chest lat]
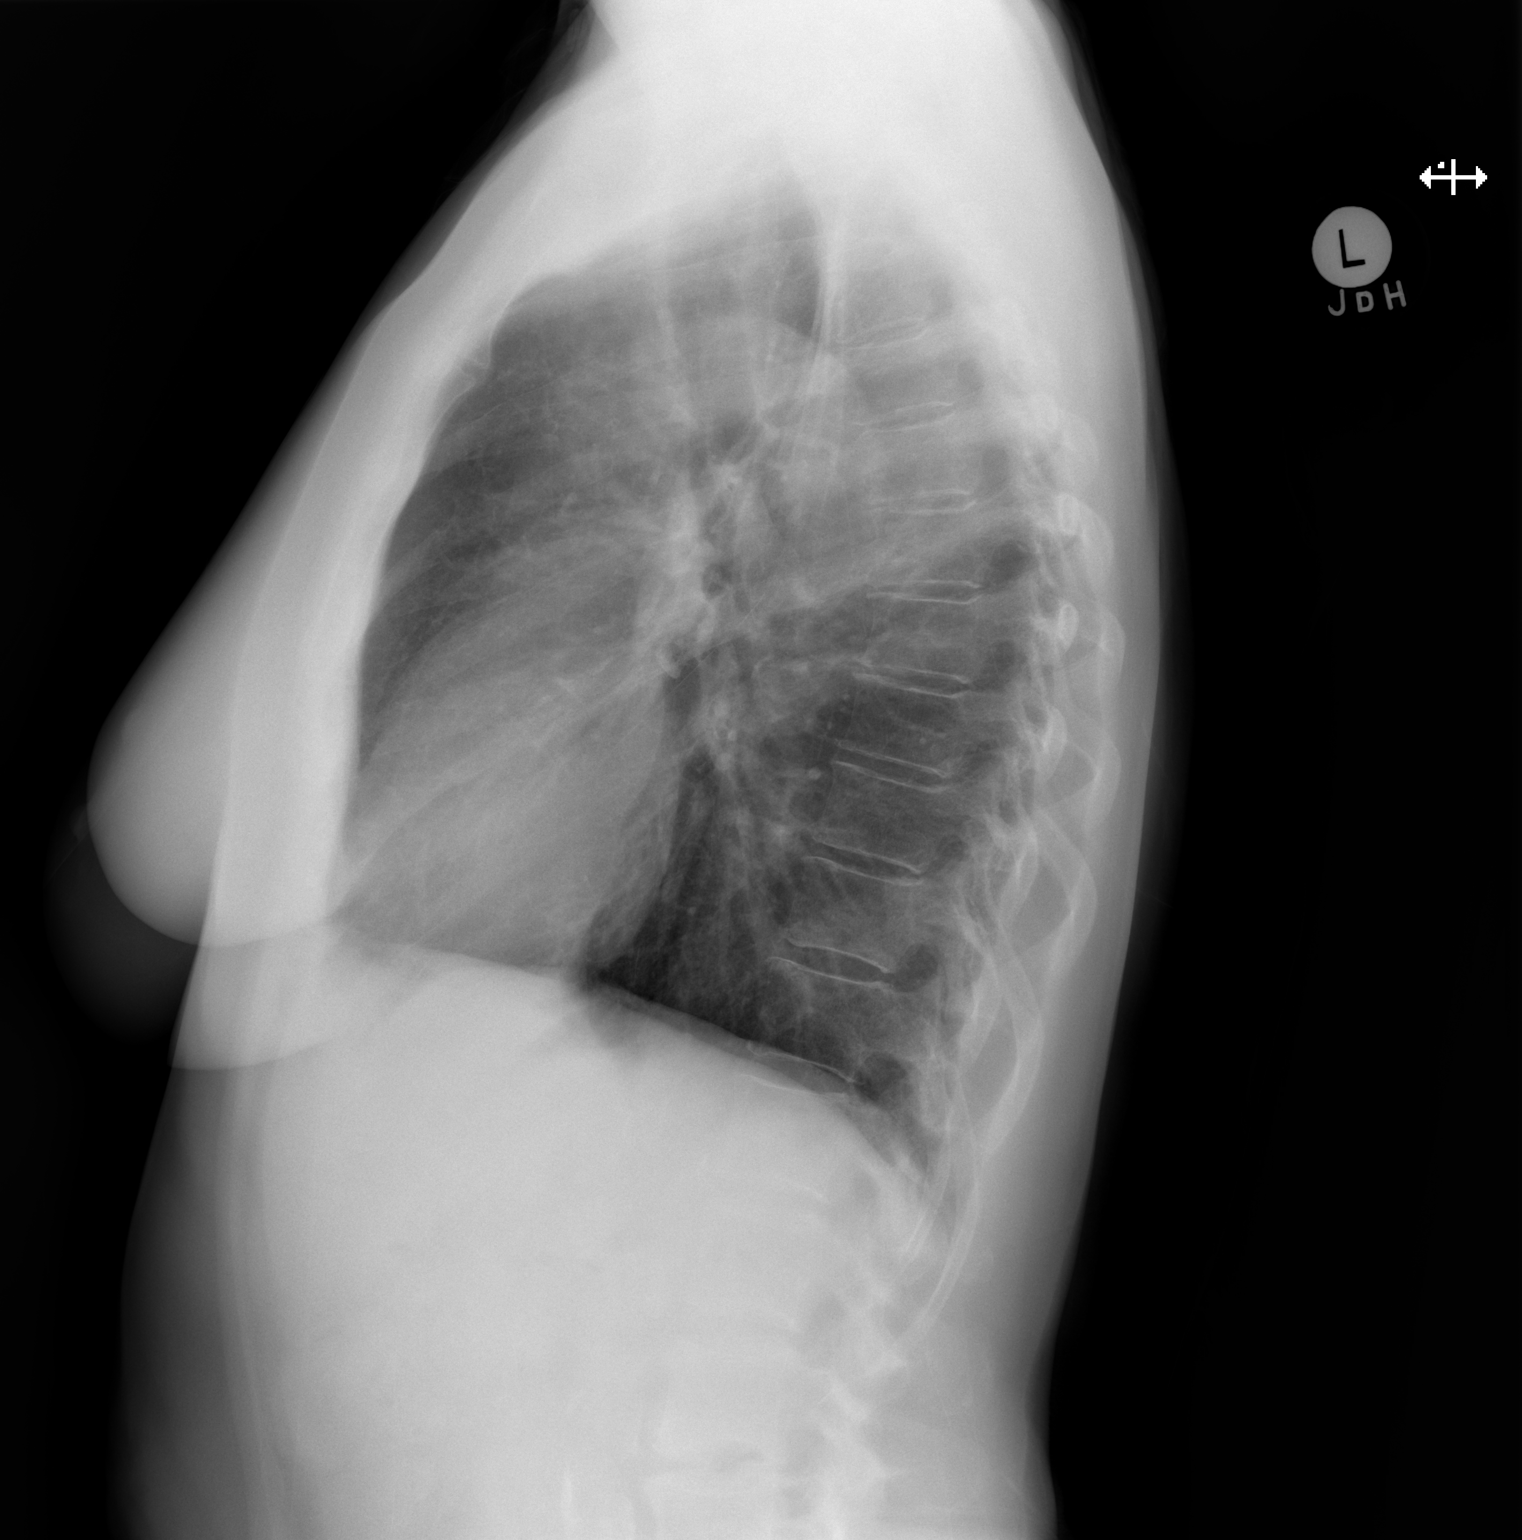

[2 of 2 positions shown; findings below may reference images not displayed]

FINDINGS: Lungs are clear.  No pleural effusion or pneumothorax.

The heart is normal in size.

Visualized osseous structures are within normal limits.
IMPRESSION: Normal chest radiographs.

## 2019-03-10 ENCOUNTER — Ambulatory Visit (INDEPENDENT_AMBULATORY_CARE_PROVIDER_SITE_OTHER): Payer: 59 | Admitting: *Deleted

## 2019-03-10 DIAGNOSIS — J309 Allergic rhinitis, unspecified: Secondary | ICD-10-CM

## 2019-03-24 ENCOUNTER — Ambulatory Visit (INDEPENDENT_AMBULATORY_CARE_PROVIDER_SITE_OTHER): Payer: 59 | Admitting: *Deleted

## 2019-03-24 DIAGNOSIS — J309 Allergic rhinitis, unspecified: Secondary | ICD-10-CM | POA: Diagnosis not present

## 2019-04-07 ENCOUNTER — Ambulatory Visit (INDEPENDENT_AMBULATORY_CARE_PROVIDER_SITE_OTHER): Payer: 59 | Admitting: *Deleted

## 2019-04-07 DIAGNOSIS — J309 Allergic rhinitis, unspecified: Secondary | ICD-10-CM | POA: Diagnosis not present

## 2019-04-21 ENCOUNTER — Ambulatory Visit (INDEPENDENT_AMBULATORY_CARE_PROVIDER_SITE_OTHER): Payer: 59 | Admitting: *Deleted

## 2019-04-21 DIAGNOSIS — J309 Allergic rhinitis, unspecified: Secondary | ICD-10-CM | POA: Diagnosis not present

## 2019-04-28 ENCOUNTER — Ambulatory Visit (INDEPENDENT_AMBULATORY_CARE_PROVIDER_SITE_OTHER): Payer: PPO | Admitting: *Deleted

## 2019-04-28 DIAGNOSIS — J309 Allergic rhinitis, unspecified: Secondary | ICD-10-CM

## 2019-04-29 ENCOUNTER — Telehealth: Payer: Self-pay | Admitting: Internal Medicine

## 2019-04-29 MED ORDER — AZELASTINE HCL 0.1 % NA SOLN: 1.0000 | Freq: Two times a day (BID) | NASAL | 3 refills | Status: DC

## 2019-04-29 MED ORDER — MONTELUKAST SODIUM 10 MG PO TABS: 10.0000 mg | ORAL_TABLET | Freq: Every day | ORAL | 0 refills | Status: DC

## 2019-04-29 NOTE — Telephone Encounter (Signed)
Called & spoke w/ pt to let her know we have sent 90-day supply of singulair and astelin nasal spray to her preferred pharmacy. Pt verbalized understanding. Pt last seen 09/07/2018 by CY. Both astelin and singulair are maintenance medications for this pt. Pt has an upcoming appointment w/ CY on 09/07/2019 at 9:00 AM.   Orders have been placed. Nothing further needed at this time.

## 2019-05-03 ENCOUNTER — Telehealth: Payer: Self-pay | Admitting: *Deleted

## 2019-05-03 MED ORDER — VALACYCLOVIR HCL 1 G PO TABS: 1000.0000 mg | ORAL_TABLET | Freq: Every day | ORAL | 2 refills | Status: DC | PRN

## 2019-05-03 MED ORDER — ESTRADIOL 10 MCG VA TABS: ORAL_TABLET | VAGINAL | 1 refills | Status: DC

## 2019-05-03 NOTE — Telephone Encounter (Signed)
Patient called requesting Valtrex and generic estradiol 10 mcg tablet sent to local pharmacy. [facility] [address] Rx sent.

## 2019-05-05 ENCOUNTER — Ambulatory Visit (INDEPENDENT_AMBULATORY_CARE_PROVIDER_SITE_OTHER): Payer: PPO | Admitting: *Deleted

## 2019-05-05 DIAGNOSIS — J309 Allergic rhinitis, unspecified: Secondary | ICD-10-CM

## 2019-05-07 ENCOUNTER — Encounter: Payer: Self-pay | Admitting: Gynecology

## 2019-05-07 DIAGNOSIS — Z1231 Encounter for screening mammogram for malignant neoplasm of breast: Secondary | ICD-10-CM | POA: Diagnosis not present

## 2019-05-11 ENCOUNTER — Ambulatory Visit (INDEPENDENT_AMBULATORY_CARE_PROVIDER_SITE_OTHER): Payer: PPO | Admitting: *Deleted

## 2019-05-11 DIAGNOSIS — J309 Allergic rhinitis, unspecified: Secondary | ICD-10-CM

## 2019-05-19 ENCOUNTER — Ambulatory Visit (INDEPENDENT_AMBULATORY_CARE_PROVIDER_SITE_OTHER): Payer: PPO | Admitting: *Deleted

## 2019-05-19 DIAGNOSIS — J309 Allergic rhinitis, unspecified: Secondary | ICD-10-CM

## 2019-05-20 ENCOUNTER — Ambulatory Visit (INDEPENDENT_AMBULATORY_CARE_PROVIDER_SITE_OTHER): Payer: PPO | Admitting: Gynecology

## 2019-05-20 ENCOUNTER — Other Ambulatory Visit: Payer: Self-pay

## 2019-05-20 ENCOUNTER — Encounter: Payer: Self-pay | Admitting: Gynecology

## 2019-05-20 VITALS — BP 144/94

## 2019-05-20 DIAGNOSIS — N3001 Acute cystitis with hematuria: Secondary | ICD-10-CM | POA: Diagnosis not present

## 2019-05-20 MED ORDER — NITROFURANTOIN MONOHYD MACRO 100 MG PO CAPS: 100.0000 mg | ORAL_CAPSULE | Freq: Two times a day (BID) | ORAL | 0 refills | Status: DC

## 2019-05-20 MED ORDER — FLUCONAZOLE 150 MG PO TABS: 150.0000 mg | ORAL_TABLET | Freq: Once | ORAL | 0 refills | Status: AC

## 2019-05-20 NOTE — Progress Notes (Signed)
    Carrie Cox May 04, 1946 [id number]        72 y.o.  G1P1001 presents with several days of worsening urinary frequency, urgency and suprapubic pressure.  Had blood in her urine this morning.  No fever or chills.  No significant low back pain.  No vaginal discharge irritation or odor.  Past medical history,surgical history, problem list, medications, allergies, family history and social history were all reviewed and documented in the EPIC chart.  Directed ROS with pertinent positives and negatives documented in the history of present illness/assessment and plan.  Exam: Vitals:   05/20/19 1207  BP: (!) 144/94   General appearance:  Normal Spine straight without CVA tenderness Abdomen soft nontender without masses guarding rebound  Assessment/Plan:  74 y.o. G1P1001 with history and urine analysis consistent with UTI.  Will treat with Macrobid 100 mg twice daily x7 days.  Will follow-up if her symptoms persist, worsen or recur.  Elevated blood pressure 144/94 noted to the patient and she is going to recheck in a non-exam situation.  Follow-up with primary provider if continues elevated.    Anastasio Auerbach MD, 12:31 PM 05/20/2019

## 2019-05-20 NOTE — Patient Instructions (Signed)
Start on the antibiotics twice daily for 7 days.  Follow-up if your urinary symptoms continue, worsen or recur.

## 2019-05-22 LAB — URINALYSIS, COMPLETE W/RFL CULTURE
Bilirubin Urine: NEGATIVE
Glucose, UA: NEGATIVE
Hyaline Cast: NONE SEEN /LPF
Nitrites, Initial: NEGATIVE
Specific Gravity, Urine: 1.025 (ref 1.001–1.03)
pH: 5 (ref 5.0–8.0)

## 2019-05-22 LAB — URINE CULTURE
MICRO NUMBER:: 697704
SPECIMEN QUALITY:: ADEQUATE

## 2019-05-22 LAB — CULTURE INDICATED

## 2019-05-31 ENCOUNTER — Ambulatory Visit (INDEPENDENT_AMBULATORY_CARE_PROVIDER_SITE_OTHER): Payer: PPO | Admitting: *Deleted

## 2019-05-31 DIAGNOSIS — J309 Allergic rhinitis, unspecified: Secondary | ICD-10-CM | POA: Diagnosis not present

## 2019-06-16 ENCOUNTER — Ambulatory Visit (INDEPENDENT_AMBULATORY_CARE_PROVIDER_SITE_OTHER): Payer: PPO | Admitting: *Deleted

## 2019-06-16 DIAGNOSIS — J309 Allergic rhinitis, unspecified: Secondary | ICD-10-CM

## 2019-06-30 ENCOUNTER — Ambulatory Visit (INDEPENDENT_AMBULATORY_CARE_PROVIDER_SITE_OTHER): Payer: PPO | Admitting: *Deleted

## 2019-06-30 DIAGNOSIS — J309 Allergic rhinitis, unspecified: Secondary | ICD-10-CM | POA: Diagnosis not present

## 2019-07-06 ENCOUNTER — Telehealth: Payer: Self-pay | Admitting: *Deleted

## 2019-07-06 DIAGNOSIS — J3089 Other allergic rhinitis: Secondary | ICD-10-CM

## 2019-07-06 NOTE — Telephone Encounter (Signed)
Dr. Neldon Mc, patient was allergy tested June 2018 and grass, trees and weeds were negative. Please advise.

## 2019-07-06 NOTE — Telephone Encounter (Signed)
Left message for patient to return call.

## 2019-07-06 NOTE — Telephone Encounter (Signed)
Patient is wondering if she can come in for allergy testing so that we can add grass, trees, and weeds to her immunotherapy plan. Patient states that she has had a horrible summer. She states that in the past, those three things have been apart of her allergy injections at her other allergist. Patient would like a return call from a nurse.

## 2019-07-06 NOTE — Telephone Encounter (Signed)
Please arrange for patient to obtain a area two aero allergen IgE profile

## 2019-07-07 ENCOUNTER — Telehealth: Payer: Self-pay | Admitting: Allergy and Immunology

## 2019-07-07 DIAGNOSIS — J3089 Other allergic rhinitis: Secondary | ICD-10-CM | POA: Diagnosis not present

## 2019-07-07 NOTE — Telephone Encounter (Signed)
Patient called and said she needs some lab orders for [facility], to get her blood drawn here, in the [place] office.

## 2019-07-07 NOTE — Telephone Encounter (Signed)
Labs have been placed. Called patient and informed. Patient verbalized understanding and will be coming by this afternoon to the [place] office to have blood drawn.

## 2019-07-07 NOTE — Telephone Encounter (Signed)
Patient returned call to the office.  Patient was informed of Lab order for area two allergen IgE profile per Dr. Neldon Mc.  Patient will check to make sure on which lab she needs to get labs drawn at and will call back.

## 2019-07-07 NOTE — Addendum Note (Signed)
Addended by: Chip Boer R on: 07/07/2019 11:21 AM   Modules accepted: Orders

## 2019-07-09 ENCOUNTER — Encounter: Payer: Self-pay | Admitting: Allergy

## 2019-07-09 ENCOUNTER — Other Ambulatory Visit: Payer: Self-pay

## 2019-07-09 ENCOUNTER — Ambulatory Visit (INDEPENDENT_AMBULATORY_CARE_PROVIDER_SITE_OTHER): Payer: PPO | Admitting: Allergy

## 2019-07-09 VITALS — BP 160/90 | HR 108 | Temp 98.2°F | Resp 16 | Ht 64.0 in | Wt 147.6 lb

## 2019-07-09 DIAGNOSIS — R05 Cough: Secondary | ICD-10-CM

## 2019-07-09 DIAGNOSIS — H1010 Acute atopic conjunctivitis, unspecified eye: Secondary | ICD-10-CM | POA: Diagnosis not present

## 2019-07-09 DIAGNOSIS — J3089 Other allergic rhinitis: Secondary | ICD-10-CM | POA: Diagnosis not present

## 2019-07-09 DIAGNOSIS — Z91030 Bee allergy status: Secondary | ICD-10-CM

## 2019-07-09 DIAGNOSIS — T7802XD Anaphylactic reaction due to shellfish (crustaceans), subsequent encounter: Secondary | ICD-10-CM

## 2019-07-09 DIAGNOSIS — R059 Cough, unspecified: Secondary | ICD-10-CM

## 2019-07-09 DIAGNOSIS — H01119 Allergic dermatitis of unspecified eye, unspecified eyelid: Secondary | ICD-10-CM

## 2019-07-09 DIAGNOSIS — Z91038 Other insect allergy status: Secondary | ICD-10-CM

## 2019-07-09 MED ORDER — EPINEPHRINE 0.3 MG/0.3ML IJ SOAJ: 0.3000 mg | Freq: Once | INTRAMUSCULAR | 2 refills | Status: AC

## 2019-07-09 MED ORDER — PIMECROLIMUS 1 % EX CREA: TOPICAL_CREAM | CUTANEOUS | 1 refills | Status: DC

## 2019-07-09 MED ORDER — EPINEPHRINE 0.3 MG/0.3ML IJ SOAJ: INTRAMUSCULAR | 2 refills | Status: DC

## 2019-07-09 MED ORDER — OLOPATADINE HCL 0.2 % OP SOLN: 1.0000 [drp] | OPHTHALMIC | 12 refills | Status: DC

## 2019-07-09 MED ORDER — EPINEPHRINE 0.3 MG/0.3ML IJ SOAJ: 0.3000 mg | INTRAMUSCULAR | 2 refills | Status: DC | PRN

## 2019-07-09 MED ORDER — LEVALBUTEROL TARTRATE 45 MCG/ACT IN AERO: 2.0000 | INHALATION_SPRAY | Freq: Four times a day (QID) | RESPIRATORY_TRACT | 1 refills | Status: DC | PRN

## 2019-07-09 MED ORDER — METHYLPREDNISOLONE ACETATE 80 MG/ML IJ SUSP
80.0000 mg | Freq: Once | INTRAMUSCULAR | Status: AC
  Administered 2019-07-09: 80 mg via INTRAMUSCULAR

## 2019-07-09 NOTE — Patient Instructions (Addendum)
  1. Continue to Perform allergen avoidance measures.   Environmental allergy panel pending.  Dr. Neldon Mc will review once results are in and nurse will call with results.  2. Continue Treat and prevent inflammation:   A. montelukast 10 mg tablet 1 time per day  B. Flonase 1-2 sprays each nostril one time per day  3. If needed:   A. Azelastine 1-2 sprays each nostril 1-2 times per day for nasal drainage  B. OTC antihistamine - Zyrtec/Claritin/Allegra  C. nasal saline spray  D. EpiPen, Benadryl, M.D./ER evaluation for allergic reaction  E. Xopenex (levalbuterol) 2 puffs every 4-6 hours for cough/wheeze/shortness of breath/chest tightness.   This replaces your albuterol inhaler and will have less side effects with use.  F. Pataday one drop each eye one time per day  G. Elidel ointment 1-2 times a day for itch/redness/flakiness of skin.  Can use on the eyelids; avoid getting in the eye itself.  This replaces Hydrocortisone that contains aloe.  4. Continue immunotherapy  5. Depomedrol injection provided in clinic today for eye swelling, eyelid dermatitis as well as respiratory symptoms.   If symptoms do not completely improve can take prednisone pack 20mg  daily x 3 days.  6. Return to clinic in 3-4 months or earlier if problem with Dr. Neldon Mc

## 2019-07-09 NOTE — Progress Notes (Signed)
Follow-up Note  RE: Carrie Cox MRN: [id number] DOB: 08/05/1946 Date of Office Visit: 07/09/2019   History of present illness: Carrie Cox is a 73 y.o. female presenting today for sick visit.  She has history of allergic rhinitis/conjunctivitis on immunotherapy, atopic dermatitis, food allergy and stinging insect allergy.  She was last seen by Dr. Neldon Mc on 01/19/2019.   She states yesterday she woke up with b/l eye swelling and redness and itching of the eyelids.  She also states it has been flaky/peeling.  She took benadryl throughout the day and states eyes are still slightly swollen today and still very itchy and red.  She states she was coughing throughout the night and sometimes coughed up a yellow/green mucus.  She feels like "something is stuck in my bronchial tube."  She also states she has had some voice changes today.   She states she didn't use the pataday as its not very effective.  She also doesn't use the hydrocortisone recommended by Dr. Neldon Mc because it contains aloe which she reports she is allergic too.  She also states she didn't use her albuterol inhaler for her cough as she states it causes her to become very hyperactive, jittery, heart races and develops a HA.    Review of systems: Review of Systems  Constitutional: Negative for chills, fever and malaise/fatigue.  HENT: Positive for sore throat. Negative for congestion, ear discharge and nosebleeds.   Eyes: Negative for pain, discharge and redness.  Respiratory: Positive for cough and sputum production. Negative for hemoptysis, shortness of breath and wheezing.   Cardiovascular: Negative.   Gastrointestinal: Negative for abdominal pain, constipation, diarrhea, heartburn, nausea and vomiting.  Musculoskeletal: Negative for joint pain.  Skin: Positive for itching and rash.  Neurological: Negative for headaches.    All other systems negative unless noted above in HPI  Past medical/social/surgical/family  history have been reviewed and are unchanged unless specifically indicated below.  No changes  Medication List: Allergies as of 07/09/2019      Reactions   Bee Venom Anaphylaxis   Allergic to yellow jackets   Shellfish Allergy Anaphylaxis   Aloe    Red rash (sunburn type)   Codeine Nausea And Vomiting   Doxycycline    Stomach pain   Erythromycin    Stomach pain   Nsaids Nausea Only   Terrible pains in stomach   Other    Pain meds in general --Nausea and vomiting Artificial Sweetners- vomiting and swelling Anti-inflammatories - "can't take oral anti-inflammatories it hurts my stomach - Nausea and stabbing stomach pain" [place]-Severe Rash/hives   Soy Allergy Hives      Medication List       Accurate as of July 09, 2019  4:19 PM. If you have any questions, ask your nurse or doctor.        STOP taking these medications   albuterol 108 (90 Base) MCG/ACT inhaler Commonly known as: ProAir HFA Stopped by: Renea Schoonmaker Charmian Muff, MD   azithromycin 250 MG tablet Commonly known as: Zithromax Stopped by: Raelin Pixler Charmian Muff, MD   nitrofurantoin (macrocrystal-monohydrate) 100 MG capsule Commonly known as: Macrobid Stopped by: Kemyah Buser Charmian Muff, MD   predniSONE 10 MG tablet Commonly known as: DELTASONE Stopped by: Quayshawn Nin Charmian Muff, MD     TAKE these medications   ALPRAZolam 0.25 MG tablet Commonly known as: XANAX Take 0.25 mg by mouth at bedtime as needed for anxiety.   azelastine 0.1 % nasal spray Commonly known as: ASTELIN Place  1 spray into both nostrils 2 (two) times daily.   BENADRYL PO Take by mouth as needed.   carisoprodol 350 MG tablet Commonly known as: SOMA Take 350 mg by mouth 4 (four) times daily as needed for muscle spasms.   EPINEPHrine 0.3 mg/0.3 mL Soaj injection Commonly known as: EpiPen 2-Pak Inject 0.3 mLs (0.3 mg total) into the muscle once for 1 dose. What changed:   how much to take  how to take this   when to take this  additional instructions Changed by: Lauris Serviss Charmian Muff, MD   escitalopram 10 MG tablet Commonly known as: LEXAPRO Take 10 mg by mouth daily.   Estradiol 10 MCG Tabs vaginal tablet Commonly known as: Yuvafem INSERT 1 TABLET VAGINALLY TWICE TIMES A WEEK   fluticasone 50 MCG/ACT nasal spray Commonly known as: FLONASE 1-2 puffs each nostril once daily as needed   levalbuterol 45 MCG/ACT inhaler Commonly known as: Xopenex HFA Inhale 2 puffs into the lungs every 6 (six) hours as needed for wheezing. Started by: Tryniti Laatsch Charmian Muff, MD   loratadine 10 MG tablet Commonly known as: CLARITIN Take 10 mg by mouth daily as needed for allergies.   montelukast 10 MG tablet Commonly known as: SINGULAIR Take 1 tablet (10 mg total) by mouth daily.   NON FORMULARY [facility]  Anti-Inflammatory Cream- Diclofenac 3%, Baclofen 2%, Lidocaine 2% Apply 1-2 grams to affected area 3-4 times daily Qty. 120 gm 3 refills   Olopatadine HCl 0.2 % Soln Commonly known as: Pataday Place 1 drop into both eyes 1 day or 1 dose. Started by: Jayda White Charmian Muff, MD   pimecrolimus 1 % cream Commonly known as: Elidel Use on eyelids BID prn for rash. Started by: Edison Nicholson Charmian Muff, MD   pseudoephedrine 30 MG tablet Commonly known as: SUDAFED Take 30 mg by mouth as needed for congestion.   REPLENS VA Place vaginally.   valACYclovir 1000 MG tablet Commonly known as: VALTREX Take 1 tablet (1,000 mg total) by mouth daily as needed.   VITAMIN D PO Take 2,000 Units by mouth daily.       Known medication allergies: Allergies  Allergen Reactions  . 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 Shellfish Allergy Anaphylaxis  . Aloe     Red rash (sunburn type)  . Codeine Nausea And Vomiting  . Doxycycline     Stomach pain  . Erythromycin     Stomach pain  . Nsaids Nausea Only    Terrible pains in stomach  . Other     Pain meds in  general --Nausea and vomiting Artificial Sweetners- vomiting and swelling Anti-inflammatories - "can't take oral anti-inflammatories it hurts my stomach - Nausea and stabbing stomach pain"    [place]-Severe Rash/hives  . Soy Allergy Hives     Physical examination: Blood pressure (!) 160/90, pulse (!) 108, temperature 98.2 F (36.8 C), temperature source Temporal, resp. rate 16, height 5\' 4"  (1.626 m), weight 147 lb 9.6 oz (67 kg), SpO2 97 %.  General: Alert, interactive, in no acute distress. HEENT: PERRLA, periorbital erythema with minimal flakiness, TMs pearly gray, turbinates non-edematous without discharge, post-pharynx non erythematous. Neck: Supple without lymphadenopathy. Lungs: Clear to auscultation without wheezing, rhonchi or rales. {no increased work of breathing. CV: Normal S1, S2 without murmurs. Abdomen: Nondistended, nontender. Skin: Warm and dry, without lesions or rashes. Extremities:  No clubbing, cyanosis or edema. Neuro:   Grossly intact.  Diagnositics/Labs: None today  Assessment and plan:  Atopic dermatitis - periorbital flare.  depomedrol injection provide to help with dermatitis as well as her respiratory symptoms.  Will change her topical therapy to elidel.  Likely flare triggered by recent yard-work. 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 Continue to Perform allergen avoidance measures.   Environmental allergy panel pending.  Dr. Neldon Mc will review once results are in and nurse will call with results.  2. Continue Treat and prevent inflammation:   A. montelukast 10 mg tablet 1 time per day  B. Flonase 1-2 sprays each nostril one time per day  3. If needed:   A. Azelastine 1-2 sprays each nostril 1-2 times per day for nasal  drainage  B. OTC antihistamine - Zyrtec/Claritin/Allegra  C. nasal saline spray  D. EpiPen, Benadryl, M.D./ER evaluation for allergic reaction  E. Xopenex (levalbuterol) 2 puffs every 4-6 hours for cough/wheeze/shortness of breath/chest tightness.   This replaces your albuterol inhaler and will have less side effects with use.  F. Pataday one drop each eye one time per day  G. Elidel ointment 1-2 times a day for itch/redness/flakiness of skin.  Can use on the eyelids; avoid getting in the eye itself.  This replaces Hydrocortisone that contains aloe.  4. Continue immunotherapy  5. Depomedrol injection provided in clinic today for eye swelling, eyelid dermatitis as well as respiratory symptoms.   If symptoms do not completely improve can take prednisone pack 20mg  daily x 3 days.  6. Return to clinic in 3-4 months or earlier if problem with Dr. Neldon Mc  I appreciate the opportunity to take part in [place] care. Please do not hesitate to contact me with questions.  Sincerely,   Prudy Feeler, MD Allergy/Immunology [facility]

## 2019-07-10 LAB — ALLERGENS W/TOTAL IGE AREA 2
Alternaria Alternata IgE: <0.1 kU/L
Aspergillus Fumigatus IgE: <0.1 kU/L
Bermuda Grass IgE: <0.1 kU/L
Cat Dander IgE: 0.13 kU/L — AB
Cedar, Mountain IgE: <0.1 kU/L
Cladosporium Herbarum IgE: <0.1 kU/L
Cockroach, German IgE: <0.1 kU/L
Common Silver Birch IgE: <0.1 kU/L
Cottonwood IgE: <0.1 kU/L
D Farinae IgE: <0.1 kU/L
D Pteronyssinus IgE: <0.1 kU/L
Dog Dander IgE: <0.1 kU/L
Elm, American IgE: <0.1 kU/L
IgE (Immunoglobulin E), Serum: 21 IU/mL (ref 6–495)
Johnson Grass IgE: <0.1 kU/L
Maple/Box Elder IgE: <0.1 kU/L
Mouse Urine IgE: <0.1 kU/L
Oak, White IgE: <0.1 kU/L
Pecan, Hickory IgE: <0.1 kU/L
Penicillium Chrysogen IgE: <0.1 kU/L
Pigweed, Rough IgE: <0.1 kU/L
Ragweed, Short IgE: <0.1 kU/L
Sheep Sorrel IgE Qn: <0.1 kU/L
Timothy Grass IgE: <0.1 kU/L
White Mulberry IgE: <0.1 kU/L

## 2019-07-13 DIAGNOSIS — J3081 Allergic rhinitis due to animal (cat) (dog) hair and dander: Secondary | ICD-10-CM

## 2019-07-13 NOTE — Progress Notes (Signed)
VIALS EXP 07-12-20

## 2019-07-21 ENCOUNTER — Ambulatory Visit (INDEPENDENT_AMBULATORY_CARE_PROVIDER_SITE_OTHER): Payer: PPO | Admitting: *Deleted

## 2019-07-21 DIAGNOSIS — J309 Allergic rhinitis, unspecified: Secondary | ICD-10-CM | POA: Diagnosis not present

## 2019-07-22 ENCOUNTER — Other Ambulatory Visit: Payer: Self-pay | Admitting: Internal Medicine

## 2019-08-04 ENCOUNTER — Encounter: Payer: Self-pay | Admitting: Gynecology

## 2019-08-11 ENCOUNTER — Telehealth: Payer: Self-pay

## 2019-08-11 ENCOUNTER — Ambulatory Visit (INDEPENDENT_AMBULATORY_CARE_PROVIDER_SITE_OTHER): Payer: PPO

## 2019-08-11 DIAGNOSIS — J309 Allergic rhinitis, unspecified: Secondary | ICD-10-CM

## 2019-08-11 NOTE — Telephone Encounter (Signed)
Patient states she is currently using Yuvafem which is very expensive. She heard of a cream that is applied to inside the thighs and it is less expensive. She wondered had you heard of that and if you thought it would help her just as well.

## 2019-08-12 NOTE — Telephone Encounter (Signed)
Any cream that is applied to the skin is absorbed and distributed throughout the body.  The Yuvafem as a direct vaginal application that has limited absorption and avoids the systemic effects of estrogen.  She may want to try vaginal estradiol cream formulated through [facility] and to use applicator intravaginal twice weekly.  Last time I checked it was roughly $50 for 9-month supply.  If she is interested then you could call in a 55-month supply with refill through her next annual exam to [place].  Other pharmacies will have vaginal estradiol cream also but it is in a bigger tube that has to be drawn up into an applicator and then the applicator washed and reused.  [facility] put some in disposable syringes so it is easier.

## 2019-08-12 NOTE — Telephone Encounter (Signed)
Read patient Dr. Zelphia Cox note.  She has a supply of Yuvafem on hand and said she will continue to use that and consider. Will either call back for Rx to be called in or will talk with Dr. Loetta Rough at [facility] in November.

## 2019-09-01 ENCOUNTER — Ambulatory Visit (INDEPENDENT_AMBULATORY_CARE_PROVIDER_SITE_OTHER): Payer: PPO | Admitting: *Deleted

## 2019-09-01 DIAGNOSIS — J309 Allergic rhinitis, unspecified: Secondary | ICD-10-CM | POA: Diagnosis not present

## 2019-09-07 ENCOUNTER — Ambulatory Visit: Payer: PPO | Admitting: Internal Medicine

## 2019-09-07 ENCOUNTER — Other Ambulatory Visit (INDEPENDENT_AMBULATORY_CARE_PROVIDER_SITE_OTHER): Payer: PPO

## 2019-09-07 ENCOUNTER — Encounter: Payer: Self-pay | Admitting: Internal Medicine

## 2019-09-07 ENCOUNTER — Other Ambulatory Visit: Payer: Self-pay

## 2019-09-07 VITALS — BP 110/72 | HR 86 | Temp 97.6°F | Ht 64.0 in | Wt 142.8 lb

## 2019-09-07 DIAGNOSIS — R600 Localized edema: Secondary | ICD-10-CM

## 2019-09-07 DIAGNOSIS — E538 Deficiency of other specified B group vitamins: Secondary | ICD-10-CM | POA: Diagnosis not present

## 2019-09-07 DIAGNOSIS — E039 Hypothyroidism, unspecified: Secondary | ICD-10-CM

## 2019-09-07 DIAGNOSIS — J4520 Mild intermittent asthma, uncomplicated: Secondary | ICD-10-CM

## 2019-09-07 LAB — VITAMIN B12: Vitamin B-12: 219 pg/mL (ref 211–911)

## 2019-09-07 MED ORDER — AZITHROMYCIN 250 MG PO TABS: ORAL_TABLET | ORAL | 0 refills | Status: DC

## 2019-09-07 MED ORDER — MUPIROCIN CALCIUM 2 % NA OINT: 1.0000 "application " | TOPICAL_OINTMENT | Freq: Two times a day (BID) | NASAL | 4 refills | Status: DC

## 2019-09-07 MED ORDER — PREDNISONE 20 MG PO TABS: ORAL_TABLET | ORAL | 3 refills | Status: DC

## 2019-09-07 MED ORDER — MONTELUKAST SODIUM 10 MG PO TABS: 10.0000 mg | ORAL_TABLET | Freq: Every day | ORAL | 3 refills | Status: DC

## 2019-09-07 MED ORDER — AZELASTINE HCL 0.1 % NA SOLN: 1.0000 | Freq: Two times a day (BID) | NASAL | 3 refills | Status: DC

## 2019-09-07 NOTE — Assessment & Plan Note (Signed)
Mild intermittent uncomplicated. Plan- continue meds. Continue allergy management by her allergist Zpak to hold at her request. Labs at her request pending new PCP- B12 and thyroid panel

## 2019-09-07 NOTE — Assessment & Plan Note (Signed)
Brings pictures 09/07/2019 Not on ACEI No response to benadryl Plan- Short course prednisone to hold with prednisone discussion

## 2019-09-07 NOTE — Progress Notes (Signed)
Subjective:     Patient ID: Carrie Cox, female   DOB: 1946/05/03, 73 y.o.   MRN: [id number]  HPI F never smoker followed  for chronic rhinitis and asthma.  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  Asmanex, Flonase, pro-air HFA, Astelin nasal spray Singulair, Claritin Blames housecleaning last night for waking up this morning with puffy eyes and itchy throat.  Very rarely needs rescue inhaler and credits Singulair for good asthma control.  In October had an episode with hand swelling and itching after eating soy which she has tolerated before. She was treated at a walk-in clinic with steroids EpiPen. 10 use daily Claritin and allergy vaccine for dust mite and cat/ Dr Neldon Mc.  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 Asks if I could draw labs for B12 level and thyroid function ( TSH was elevated at 5.14) to have ready. Asthma has been well controlled with Covid precautions. Had flu vax. Discussed Covid vax. Describes 2-3 episodes of idiopathic eyelid swelling, not clearly related to meds, exposures or known allergies. Has discussed with her allergist/ Dr Neldon Mc. She asks if we could give short course prednisone to hold for this. AH haven't helped. She is cat sensitive, has a cat which is sequestered, and nothing new. Diet shifting to  vegetarian.  ROS-see HPI    + = positive Constitutional:   No-   weight loss, night sweats, fevers, chills, fatigue, lassitude. HEENT:   No-  headaches, difficulty swallowing, tooth/dental problems, sore throat,       No-  sneezing, itching, ear ache, nasal congestion, post nasal drip,  CV:  No-   chest pain, orthopnea, PND, swelling in lower extremities, anasarca,                                                         dizziness, palpitations Resp: No-   shortness of breath with exertion or at rest.              No-   productive cough,  No non-productive cough,  No- coughing up of blood.              No-   change in color of mucus.  No- wheezing.   Skin: No-   rash or lesions. GI:  No-   heartburn, indigestion, abdominal pain, nausea, vomiting,  GU:  MS:  No-   joint pain or swelling.  Neuro-     nothing unusual Psych:  No- change in mood or affect. No depression or anxiety.  No memory loss.  OBJ- Physical Exam General- Alert, Oriented, Affect-appropriate, Distress- none acute Skin- rash-none, lesions- none, excoriation- none Lymphadenopathy- none Head- atraumatic            Eyes- Gross vision intact, PERRLA, conjunctivae and secretions clear  Ears- Hearing, canals-normal            Nose- Clear, no-Septal dev, mucus, polyps, erosion, perforation             Throat- Mallampati II , mucosa clear , drainage- none, tonsils- atrophic Neck- flexible , trachea midline, no stridor , thyroid nl, carotid no bruit Chest - symmetrical excursion , unlabored           Heart/CV- RRR , no murmur , no gallop  , no rub, nl s1 s2                           - JVD- none , edema- none, stasis changes- none, varices- none           Lung- clear to P&A, wheeze- none, cough- none , dullness-none, rub- none           Chest wall-  Abd-  Br/ Gen/ Rectal- Not done, not indicated Extrem- cyanosis- none, clubbing, none, atrophy- none, strength- nl, Neuro- grossly intact to observation

## 2019-09-07 NOTE — Patient Instructions (Signed)
Refill scripts printed  Order- lab- Vitamin B12 level     Dx B12 deficiency                     Hypothyroid panel     Ex hypothyroid  Please call if we can help

## 2019-09-08 ENCOUNTER — Ambulatory Visit: Payer: Self-pay | Admitting: *Deleted

## 2019-09-08 LAB — THYROID PANEL
Free Thyroxine Index: 1.4 (ref 1.2–4.9)
T3 Uptake Ratio: 25 % (ref 24–39)
T4, Total: 5.5 ug/dL (ref 4.5–12.0)

## 2019-09-14 ENCOUNTER — Telehealth: Payer: Self-pay | Admitting: Internal Medicine

## 2019-09-14 NOTE — Telephone Encounter (Signed)
Call returned to patient, confirmed DOB, requesting results of labs:  Notes recorded by Deneise Lever, MD on 09/08/2019 at 11:30 AM EST  Lbs- Vitamin B12 level and thyroid function levels are within the lower limits of normal. We will send a print copy for her to discuss with her new primary physician.  Made aware of results. Voiced understanding.   Nothing further needed at this time.

## 2019-09-17 ENCOUNTER — Other Ambulatory Visit: Payer: Self-pay

## 2019-09-20 ENCOUNTER — Other Ambulatory Visit: Payer: Self-pay

## 2019-09-20 ENCOUNTER — Ambulatory Visit (INDEPENDENT_AMBULATORY_CARE_PROVIDER_SITE_OTHER): Payer: PPO

## 2019-09-20 ENCOUNTER — Encounter: Payer: Self-pay | Admitting: Gynecology

## 2019-09-20 ENCOUNTER — Ambulatory Visit (INDEPENDENT_AMBULATORY_CARE_PROVIDER_SITE_OTHER): Payer: PPO | Admitting: Gynecology

## 2019-09-20 VITALS — BP 126/84 | Ht 65.0 in | Wt 139.0 lb

## 2019-09-20 DIAGNOSIS — R87612 Low grade squamous intraepithelial lesion on cytologic smear of cervix (LGSIL): Secondary | ICD-10-CM | POA: Diagnosis not present

## 2019-09-20 DIAGNOSIS — J309 Allergic rhinitis, unspecified: Secondary | ICD-10-CM

## 2019-09-20 DIAGNOSIS — M8580 Other specified disorders of bone density and structure, unspecified site: Secondary | ICD-10-CM

## 2019-09-20 DIAGNOSIS — Z124 Encounter for screening for malignant neoplasm of cervix: Secondary | ICD-10-CM

## 2019-09-20 DIAGNOSIS — Z01419 Encounter for gynecological examination (general) (routine) without abnormal findings: Secondary | ICD-10-CM

## 2019-09-20 DIAGNOSIS — Z9289 Personal history of other medical treatment: Secondary | ICD-10-CM

## 2019-09-20 DIAGNOSIS — R87610 Atypical squamous cells of undetermined significance on cytologic smear of cervix (ASC-US): Secondary | ICD-10-CM | POA: Diagnosis not present

## 2019-09-20 DIAGNOSIS — N952 Postmenopausal atrophic vaginitis: Secondary | ICD-10-CM

## 2019-09-20 NOTE — Addendum Note (Signed)
Addended by: Nelva Nay on: 09/20/2019 10:55 AM   Modules accepted: Orders

## 2019-09-20 NOTE — Patient Instructions (Signed)
Thank you for the cookies!  Follow-up for Pap smear results  Follow-up in 1 year for annual exam

## 2019-09-20 NOTE — Progress Notes (Signed)
    Carrie Cox 1946/09/18 [id number]        73 y.o.  G1P1001 for breast and pelvic exam.  Without gynecologic complaints  Past medical history,surgical history, problem list, medications, allergies, family history and social history were all reviewed and documented as reviewed in the EPIC chart.  ROS:  Performed with pertinent positives and negatives included in the history, assessment and plan.   Additional significant findings : None   Exam: Caryn Bee assistant Vitals:   09/20/19 0922  BP: 126/84  Weight: 139 lb (63 kg)  Height: 5\' 5"  (1.651 m)   Body mass index is 23.13 kg/m.  General appearance:  Normal affect, orientation and appearance. Skin: Grossly normal HEENT: Without gross lesions.  No cervical or supraclavicular adenopathy. Thyroid normal.  Lungs:  Clear without wheezing, rales or rhonchi Cardiac: RR, without RMG Abdominal:  Soft, nontender, without masses, guarding, rebound, organomegaly or hernia Breasts:  Examined lying and sitting without masses, retractions, discharge or axillary adenopathy. Pelvic:  Ext, BUS, Vagina: With atrophic changes  Cervix: With atrophic changes.  Pap smear done  Uterus: Anteverted, normal size, shape and contour, midline and mobile nontender   Adnexa: Without masses or tenderness    Anus and perineum: Normal   Rectovaginal: Normal sphincter tone without palpated masses or tenderness.    Assessment/Plan:  73 y.o. G57P1001 female for breast and pelvic exam.  1. Postmenopausal/atrophic genital changes.  Continues with Yuvafem twice weekly.  We have discussed risks versus benefits on multiple occasions to include absorption with systemic effects.  Patient comfortable continuing.  Has supply.  Will call when needs refills 2. Pap smear ASCUS negative high risk HPV last year.  Follow-up colposcopy inadequate with no abnormalities seen.  ECC showed LGSIL changes.  Pap smear done today.  Triage based upon results. 3. Osteopenia.  DEXA  09/2018 T score -1.2 FRAX 9% / 1%.  Plan repeat DEXA at 2-year interval. 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  Will call when needs refills. 5. Mammography 04/2019.  Continue with annual mammography when due.  Breast exam normal today. 6. Colonoscopy 2018.  Repeat at their recommended interval. 7. Health maintenance.  No routine lab work as patient does this elsewhere.  Follow-up 1 year, sooner as needed.   Anastasio Auerbach MD, 9:51 AM 09/20/2019

## 2019-09-21 ENCOUNTER — Other Ambulatory Visit: Payer: Self-pay

## 2019-09-21 ENCOUNTER — Telehealth: Payer: Self-pay

## 2019-09-21 MED ORDER — VALACYCLOVIR HCL 1 G PO TABS: 1000.0000 mg | ORAL_TABLET | Freq: Every day | ORAL | 1 refills | Status: DC | PRN

## 2019-09-21 MED ORDER — ESTRADIOL 10 MCG VA TABS: ORAL_TABLET | VAGINAL | 4 refills | Status: DC

## 2019-09-21 MED ORDER — FLUCONAZOLE 150 MG PO TABS: 150.0000 mg | ORAL_TABLET | Freq: Once | ORAL | 0 refills | Status: DC

## 2019-09-21 MED ORDER — FLUCONAZOLE 150 MG PO TABS: 150.0000 mg | ORAL_TABLET | Freq: Once | ORAL | 0 refills | Status: AC

## 2019-09-21 NOTE — Telephone Encounter (Signed)
Patient was in yesterday and said she forgot to ask. Patient said new insurance. Wants 90 day written Rx for Diflucan, Valtrex and Yuvafam mailed to her.  Rx's are attached/pending. I explained to her that Diflucan is not usually a long term Rx that you get at a mail order pharmacy.  She said she would be fine with just one tablet. Please advise quantity and refills.

## 2019-09-27 ENCOUNTER — Ambulatory Visit (INDEPENDENT_AMBULATORY_CARE_PROVIDER_SITE_OTHER): Payer: PPO | Admitting: *Deleted

## 2019-09-27 DIAGNOSIS — J309 Allergic rhinitis, unspecified: Secondary | ICD-10-CM

## 2019-09-28 ENCOUNTER — Encounter: Payer: Self-pay | Admitting: Gynecology

## 2019-09-28 LAB — PAP IG W/ RFLX HPV ASCU

## 2019-09-28 LAB — HUMAN PAPILLOMAVIRUS, HIGH RISK: HPV DNA High Risk: NOT DETECTED

## 2019-10-08 DIAGNOSIS — F19982 Other psychoactive substance use, unspecified with psychoactive substance-induced sleep disorder: Secondary | ICD-10-CM | POA: Diagnosis not present

## 2019-10-08 DIAGNOSIS — E039 Hypothyroidism, unspecified: Secondary | ICD-10-CM | POA: Diagnosis not present

## 2019-10-08 DIAGNOSIS — Z0000 Encounter for general adult medical examination without abnormal findings: Secondary | ICD-10-CM | POA: Diagnosis not present

## 2019-10-08 DIAGNOSIS — R7303 Prediabetes: Secondary | ICD-10-CM | POA: Diagnosis not present

## 2019-10-08 DIAGNOSIS — E538 Deficiency of other specified B group vitamins: Secondary | ICD-10-CM | POA: Diagnosis not present

## 2019-10-08 DIAGNOSIS — R030 Elevated blood-pressure reading, without diagnosis of hypertension: Secondary | ICD-10-CM | POA: Diagnosis not present

## 2019-10-08 DIAGNOSIS — M8589 Other specified disorders of bone density and structure, multiple sites: Secondary | ICD-10-CM | POA: Diagnosis not present

## 2019-10-08 DIAGNOSIS — Z23 Encounter for immunization: Secondary | ICD-10-CM | POA: Diagnosis not present

## 2019-10-11 ENCOUNTER — Ambulatory Visit (INDEPENDENT_AMBULATORY_CARE_PROVIDER_SITE_OTHER): Payer: PPO

## 2019-10-11 DIAGNOSIS — J309 Allergic rhinitis, unspecified: Secondary | ICD-10-CM | POA: Diagnosis not present

## 2019-10-20 ENCOUNTER — Ambulatory Visit (INDEPENDENT_AMBULATORY_CARE_PROVIDER_SITE_OTHER): Payer: PPO | Admitting: *Deleted

## 2019-10-20 DIAGNOSIS — J309 Allergic rhinitis, unspecified: Secondary | ICD-10-CM

## 2019-10-20 DIAGNOSIS — Z780 Asymptomatic menopausal state: Secondary | ICD-10-CM | POA: Diagnosis not present

## 2019-10-20 DIAGNOSIS — M8589 Other specified disorders of bone density and structure, multiple sites: Secondary | ICD-10-CM | POA: Diagnosis not present

## 2019-10-20 DIAGNOSIS — J45909 Unspecified asthma, uncomplicated: Secondary | ICD-10-CM | POA: Diagnosis not present

## 2019-10-26 ENCOUNTER — Ambulatory Visit (INDEPENDENT_AMBULATORY_CARE_PROVIDER_SITE_OTHER): Payer: PPO

## 2019-10-26 DIAGNOSIS — J309 Allergic rhinitis, unspecified: Secondary | ICD-10-CM

## 2019-11-02 ENCOUNTER — Ambulatory Visit (INDEPENDENT_AMBULATORY_CARE_PROVIDER_SITE_OTHER): Payer: PPO

## 2019-11-02 DIAGNOSIS — J309 Allergic rhinitis, unspecified: Secondary | ICD-10-CM | POA: Diagnosis not present

## 2019-11-16 ENCOUNTER — Other Ambulatory Visit: Payer: Self-pay

## 2019-11-16 ENCOUNTER — Ambulatory Visit (INDEPENDENT_AMBULATORY_CARE_PROVIDER_SITE_OTHER): Payer: PPO | Admitting: Allergy and Immunology

## 2019-11-16 ENCOUNTER — Encounter: Payer: Self-pay | Admitting: Allergy and Immunology

## 2019-11-16 VITALS — BP 126/70 | HR 82 | Temp 98.0°F | Resp 16

## 2019-11-16 DIAGNOSIS — Z91030 Bee allergy status: Secondary | ICD-10-CM

## 2019-11-16 DIAGNOSIS — H01119 Allergic dermatitis of unspecified eye, unspecified eyelid: Secondary | ICD-10-CM

## 2019-11-16 DIAGNOSIS — T7802XD Anaphylactic reaction due to shellfish (crustaceans), subsequent encounter: Secondary | ICD-10-CM | POA: Diagnosis not present

## 2019-11-16 DIAGNOSIS — J4520 Mild intermittent asthma, uncomplicated: Secondary | ICD-10-CM

## 2019-11-16 DIAGNOSIS — J3089 Other allergic rhinitis: Secondary | ICD-10-CM

## 2019-11-16 DIAGNOSIS — Z91038 Other insect allergy status: Secondary | ICD-10-CM

## 2019-11-16 NOTE — Patient Instructions (Addendum)
  1. Continue to Perform allergen avoidance measures   2. Continue Treat and prevent inflammation:   A. montelukast 10 mg tablet 1 time per day  B. Flonase 1-2 sprays each nostril one time per day  3. If needed:   A. Azelastine 1-2 sprays each nostril 1-2 times per day    B. OTC antihistamine - Allegra  C. nasal saline spray  D. EpiPen, Benadryl, M.D./ER evaluation for allergic reaction  E. Xopenex (levalbuterol) 2 puffs every 4-6 hours   F. Pataday one drop each eye one time per day  G. Elidel ointment 1-2 times a day     4. Continue immunotherapy  5. Blood - Shellfish panel, Venom panel    6. Return to clinic in 6 months or earlier if problem  7. Obtain COVID vaccine when available

## 2019-11-16 NOTE — Progress Notes (Signed)
[name]   Follow-up Note  Referring Provider: No ref. provider found Primary Provider: Darcus Austin, MD (Inactive) Date of Office Visit: 11/16/2019  Subjective:   Carrie Cox (DOB: [birth year]) is a 74 y.o. female who returns to [facility] on 11/16/2019 in re-evaluation of the following:  HPI: Charene returns to this clinic in reevaluation of allergic rhinoconjunctivitis and apparent contact dermatitis with involvement of eyelids and a history of intermittent asthma and shellfish allergy and hymenoptera venom allergy.  Her last visit with me was 19 January 2019 but she did visit with Dr. Nelva Bush on 09 July 2019 for what appeared to be a flare of her contact dermatitis involving her eyelids.  Since her last visit she has really done very well.  She continues to use immunotherapy currently at every 3 weeks without any adverse effect.  She is very careful about exposure to cats at this point in time.  She continues to use montelukast and Flonase on a consistent basis.  Her asthma has been relatively nonexistent ever since she started montelukast and she does not use a short acting bronchodilator and she can exercise without any problem and has not required a systemic steroid to treat an exacerbation of asthma.  Her contact dermatitis of her eyelids is under excellent control as long as she is very careful about fragrance exposure and strong scent exposures and fume exposures.  She has only had to use her Elidel 1 additional time since her last visit with Dr. Nelva Bush.  She is interested in undergoing further evaluation once again for her shellfish allergy and her hymenoptera venom allergy.  She does continue to carry an EpiPen.  She has received the flu vaccine this year.  Allergies as of 11/16/2019      Reactions   Bee Venom Anaphylaxis   Allergic to yellow jackets   Shellfish Allergy Anaphylaxis   Aloe    Red rash  (sunburn type)   Codeine Nausea And Vomiting   Doxycycline    Stomach pain   Erythromycin    Stomach pain   Nsaids Nausea Only   Terrible pains in stomach   Other    Pain meds in general --Nausea and vomiting Artificial Sweetners- vomiting and swelling Anti-inflammatories - "can't take oral anti-inflammatories it hurts my stomach - Nausea and stabbing stomach pain" [place]-Severe Rash/hives   Soy Allergy Hives      Medication List    ALPRAZolam 0.25 MG tablet Commonly known as: XANAX Take 0.25 mg by mouth at bedtime as needed for anxiety.   azelastine 0.1 % nasal spray Commonly known as: ASTELIN Place 1 spray into both nostrils 2 (two) times daily.   BENADRYL PO Take by mouth as needed.   carisoprodol 350 MG tablet Commonly known as: SOMA Take 350 mg by mouth 4 (four) times daily as needed for muscle spasms.   EPINEPHrine 0.3 mg/0.3 mL Soaj injection Commonly known as: EPI-PEN INJECT 0.3 MLS (0.3 MG TOTAL) INTO THE MUSCLE ONCE FOR 1 DOSE.   escitalopram 10 MG tablet Commonly known as: LEXAPRO Take 10 mg by mouth daily.   Estradiol 10 MCG Tabs vaginal tablet Commonly known as: Yuvafem INSERT 1 TABLET VAGINALLY TWICE TIMES A WEEK   fluticasone 50 MCG/ACT nasal spray Commonly known as: FLONASE 1-2 puffs each nostril once daily as needed   Fluzone High-Dose 0.5 ML injection Generic drug: Influenza vac split quadrivalent PF Fluzone High-Dose 2019-20 (PF) 180  mcg/0.5 mL intramuscular syringe  TO BE ADMINISTERED BY PHARMACIST FOR IMMUNIZATION   Fluzone High-Dose Quadrivalent 0.7 ML Susy Generic drug: Influenza Vac High-Dose Quad TO BE ADMINISTERED BY PHARMACIST FOR IMMUNIZATION   levalbuterol 45 MCG/ACT inhaler Commonly known as: Xopenex HFA Inhale 2 puffs into the lungs every 6 (six) hours as needed for wheezing.   montelukast 10 MG tablet Commonly known as: SINGULAIR Take 1 tablet (10 mg total) by mouth daily.   NON FORMULARY [facility]    Anti-Inflammatory Cream- Diclofenac 3%, Baclofen 2%, Lidocaine 2% Apply 1-2 grams to affected area 3-4 times daily Qty. 120 gm 3 refills   Olopatadine HCl 0.2 % Soln Commonly known as: Pataday Place 1 drop into both eyes 1 day or 1 dose.   pimecrolimus 1 % cream Commonly known as: Elidel Use on eyelids BID prn for rash.   REPLENS [facility] Place vaginally.   valACYclovir 1000 MG tablet Commonly known as: VALTREX Take 1 tablet (1,000 mg total) by mouth daily as needed. For outbreak   VITAMIN B 12 PO Take by mouth.   VITAMIN D PO Take 2,000 Units by mouth daily.       Past Medical History:  Diagnosis Date  . ASCUS of cervix with negative high risk HPV 08/2018, 08/2019   10/2018 colposcopy ECC showed LGSIL  . Asthma   . Atrophic vaginitis    uses vagifem 3 X week to  . HSV infection   . Multiple allergies   . Neck injury   . Osteopenia 09/2018   T score -1.2 FRAX 9% / 1%.  Stable from prior DEXA    Past Surgical History:  Procedure Laterality Date  . ADENOIDECTOMY  1955  . CARPAL TUNNEL RELEASE Right 08/2016  . FOOT SURGERY     BILATERAL ORTHO  . LAPAROSCOPIC APPENDECTOMY N/A 03/04/2013   Procedure: APPENDECTOMY LAPAROSCOPIC;  Surgeon: Shann Medal, MD;  Location: [facility];  Service: General;  Laterality: N/A;  . MOUTH SURGERY    . TONSILLECTOMY     and adenoids  . TONSILLECTOMY  1955    Review of systems negative except as noted in HPI / PMHx or noted below:  Review of Systems  Constitutional: Negative.   HENT: Negative.   Eyes: Negative.   Respiratory: Negative.   Cardiovascular: Negative.   Gastrointestinal: Negative.   Genitourinary: Negative.   Musculoskeletal: Negative.   Skin: Negative.   Neurological: Negative.   Endo/Heme/Allergies: Negative.   Psychiatric/Behavioral: Negative.      Objective:   Vitals:   11/16/19 1153  BP: 126/70  Pulse: 82  Resp: 16  Temp: 98 F (36.7 C)  SpO2: 96%          Physical Exam Constitutional:       Appearance: She is not diaphoretic.  HENT:     Head: Normocephalic.     Right Ear: Tympanic membrane, ear canal and external ear normal.     Left Ear: Tympanic membrane, ear canal and external ear normal.     Nose: Nose normal. No mucosal edema or rhinorrhea.     Mouth/Throat:     Pharynx: Uvula midline. No oropharyngeal exudate.  Eyes:     Conjunctiva/sclera: Conjunctivae normal.  Neck:     Thyroid: No thyromegaly.     Trachea: Trachea normal. No tracheal tenderness or tracheal deviation.  Cardiovascular:     Rate and Rhythm: Normal rate and regular rhythm.     Heart sounds: Normal heart sounds, S1 normal and S2 normal.  No murmur.  Pulmonary:     Effort: No respiratory distress.     Breath sounds: Normal breath sounds. No stridor. No wheezing or rales.  Lymphadenopathy:     Head:     Right side of head: No tonsillar adenopathy.     Left side of head: No tonsillar adenopathy.     Cervical: No cervical adenopathy.  Skin:    Findings: No erythema or rash.     Nails: There is no clubbing.  Neurological:     Mental Status: She is alert.     Diagnostics: none    Assessment and Plan:   1. Anaphylactic shock due to shellfish, subsequent encounter   2. History of systemic reaction to hymenoptera   3. Eyelid dermatitis, allergic/contact   4. Perennial allergic rhinitis   5. Asthma, mild intermittent, well-controlled     1. Continue to Perform allergen avoidance measures   2. Continue Treat and prevent inflammation:   A. montelukast 10 mg tablet 1 time per day  B. Flonase 1-2 sprays each nostril one time per day  3. If needed:   A. Azelastine 1-2 sprays each nostril 1-2 times per day    B. OTC antihistamine - Allegra  C. nasal saline spray  D. EpiPen, Benadryl, M.D./ER evaluation for allergic reaction  E. Xopenex (levalbuterol) 2 puffs every 4-6 hours   F. Pataday one drop each eye one time per day  G. Elidel ointment 1-2 times a day     4. Continue  immunotherapy  5. Blood - Shellfish panel, Venom panel    6. Return to clinic in 6 months or earlier if problem  7. Obtain COVID vaccine when available  Overall Mykaela has really done relatively well.  She will will continue on immunotherapy at this point as this has helped her significantly with her allergic rhinoconjunctivitis.  She will continue on montelukast and Flonase and she has a selection of other agents to be utilized should they be required or should she desire to start these medications for symptom control.  We will work through her shellfish allergy and hymenoptera allergy in more detail by checking the blood test noted above.  Allena Katz, MD Allergy / Immun[facility] [place]

## 2019-11-17 ENCOUNTER — Encounter: Payer: Self-pay | Admitting: Allergy and Immunology

## 2019-11-17 DIAGNOSIS — Z961 Presence of intraocular lens: Secondary | ICD-10-CM | POA: Diagnosis not present

## 2019-11-17 DIAGNOSIS — H524 Presbyopia: Secondary | ICD-10-CM | POA: Diagnosis not present

## 2019-11-18 LAB — ALLERGEN HYMENOPTERA PANEL
Bumblebee: <0.1 kU/L
Honeybee IgE: <0.1 kU/L
Hornet, White Face, IgE: <0.1 kU/L
Hornet, Yellow, IgE: <0.1 kU/L
Paper Wasp IgE: <0.1 kU/L
Yellow Jacket, IgE: <0.1 kU/L

## 2019-11-18 LAB — ALLERGEN PROFILE, SHELLFISH
Clam IgE: <0.1 kU/L
F023-IgE Crab: <0.1 kU/L
F080-IgE Lobster: <0.1 kU/L
F290-IgE Oyster: <0.1 kU/L
Scallop IgE: <0.1 kU/L
Shrimp IgE: <0.1 kU/L

## 2019-11-24 ENCOUNTER — Ambulatory Visit (INDEPENDENT_AMBULATORY_CARE_PROVIDER_SITE_OTHER): Payer: PPO | Admitting: *Deleted

## 2019-11-24 DIAGNOSIS — J309 Allergic rhinitis, unspecified: Secondary | ICD-10-CM | POA: Diagnosis not present

## 2019-12-14 ENCOUNTER — Ambulatory Visit (INDEPENDENT_AMBULATORY_CARE_PROVIDER_SITE_OTHER): Payer: PPO

## 2019-12-14 DIAGNOSIS — J309 Allergic rhinitis, unspecified: Secondary | ICD-10-CM

## 2020-01-10 ENCOUNTER — Ambulatory Visit (INDEPENDENT_AMBULATORY_CARE_PROVIDER_SITE_OTHER): Payer: PPO

## 2020-01-10 DIAGNOSIS — J309 Allergic rhinitis, unspecified: Secondary | ICD-10-CM | POA: Diagnosis not present

## 2020-02-02 ENCOUNTER — Ambulatory Visit (INDEPENDENT_AMBULATORY_CARE_PROVIDER_SITE_OTHER): Payer: PPO

## 2020-02-02 DIAGNOSIS — J309 Allergic rhinitis, unspecified: Secondary | ICD-10-CM

## 2020-02-08 DIAGNOSIS — J3081 Allergic rhinitis due to animal (cat) (dog) hair and dander: Secondary | ICD-10-CM | POA: Diagnosis not present

## 2020-02-08 NOTE — Progress Notes (Signed)
VIALS EXP 02-07-21

## 2020-02-09 DIAGNOSIS — J3089 Other allergic rhinitis: Secondary | ICD-10-CM | POA: Diagnosis not present

## 2020-02-23 ENCOUNTER — Ambulatory Visit (INDEPENDENT_AMBULATORY_CARE_PROVIDER_SITE_OTHER): Payer: PPO

## 2020-02-23 DIAGNOSIS — J309 Allergic rhinitis, unspecified: Secondary | ICD-10-CM

## 2020-03-06 ENCOUNTER — Other Ambulatory Visit: Payer: Self-pay | Admitting: Internal Medicine

## 2020-03-16 ENCOUNTER — Ambulatory Visit (INDEPENDENT_AMBULATORY_CARE_PROVIDER_SITE_OTHER): Payer: PPO

## 2020-03-16 DIAGNOSIS — J309 Allergic rhinitis, unspecified: Secondary | ICD-10-CM

## 2020-03-21 ENCOUNTER — Ambulatory Visit (INDEPENDENT_AMBULATORY_CARE_PROVIDER_SITE_OTHER): Payer: PPO

## 2020-03-21 DIAGNOSIS — J309 Allergic rhinitis, unspecified: Secondary | ICD-10-CM

## 2020-03-29 ENCOUNTER — Ambulatory Visit (INDEPENDENT_AMBULATORY_CARE_PROVIDER_SITE_OTHER): Payer: PPO

## 2020-03-29 DIAGNOSIS — J309 Allergic rhinitis, unspecified: Secondary | ICD-10-CM | POA: Diagnosis not present

## 2020-04-03 ENCOUNTER — Ambulatory Visit: Payer: PPO | Admitting: Obstetrics & Gynecology

## 2020-04-03 ENCOUNTER — Other Ambulatory Visit: Payer: Self-pay

## 2020-04-04 ENCOUNTER — Encounter: Payer: Self-pay | Admitting: Obstetrics & Gynecology

## 2020-04-04 ENCOUNTER — Ambulatory Visit: Payer: PPO | Admitting: Obstetrics & Gynecology

## 2020-04-04 VITALS — BP 140/80

## 2020-04-04 DIAGNOSIS — R87610 Atypical squamous cells of undetermined significance on cytologic smear of cervix (ASC-US): Secondary | ICD-10-CM | POA: Diagnosis not present

## 2020-04-04 DIAGNOSIS — N870 Mild cervical dysplasia: Secondary | ICD-10-CM

## 2020-04-04 NOTE — Patient Instructions (Signed)
1. ASCUS of cervix with negative high risk HPV H/O CIN on ECC Colpo 10/2018.  Repeat Pap 08/2019 ASCUS/HPV HR Negative.  Repeat Pap test today.  Counseling on abnormal Pap/Mild dysplasia.  Patient reassured.  2. Dysplasia of cervix, low grade (CIN 1) Colpo 10/2018 CIN 1 on ECC.  Carrie Cox, it was a pleasure seeing you today!  I will inform you of your results as soon as they are available.

## 2020-04-04 NOTE — Progress Notes (Signed)
    Carrie Cox ABRIL 19-Sep-1946 [id number]        74 y.o.  G1P1001   RP: ASCUS/h/o CIN1 for 6 month repeat Pap  HPI: Pap 08/2019 ASCUS/HPV HR Negative.  Colpo 10/2018 CIN 1 on ECC.   OB History  Gravida Para Term Preterm AB Living  1 1 1     1   SAB TAB Ectopic Multiple Live Births               # Outcome Date GA Lbr Len/2nd Weight Sex Delivery Anes PTL Lv  1 Term             Past medical history,surgical history, problem list, medications, allergies, family history and social history were all reviewed and documented in the EPIC chart.   Directed ROS with pertinent positives and negatives documented in the history of present illness/assessment and plan.  Exam:  Vitals:   04/04/20 1018  BP: 140/80   General appearance:  Normal  Gynecologic exam: Vulva normal.  Speculum:  Cervix/Vagina normal.  Pap reflex done.  Normal vaginal secretions.   Assessment/Plan:  74 y.o. G1P1001   1. ASCUS of cervix with negative high risk HPV H/O CIN on ECC Colpo 10/2018.  Repeat Pap 08/2019 ASCUS/HPV HR Negative.  Repeat Pap test today.  Counseling on abnormal Pap/Mild dysplasia.  Patient reassured.  2. Dysplasia of cervix, low grade (CIN 1) Colpo 10/2018 CIN 1 on ECC.  Princess Bruins MD, 10:55 AM 04/04/2020

## 2020-04-06 DIAGNOSIS — M79642 Pain in left hand: Secondary | ICD-10-CM | POA: Diagnosis not present

## 2020-04-06 DIAGNOSIS — M1812 Unilateral primary osteoarthritis of first carpometacarpal joint, left hand: Secondary | ICD-10-CM | POA: Diagnosis not present

## 2020-04-06 DIAGNOSIS — M189 Osteoarthritis of first carpometacarpal joint, unspecified: Secondary | ICD-10-CM | POA: Insufficient documentation

## 2020-04-06 LAB — PAP IG W/ RFLX HPV ASCU

## 2020-04-07 DIAGNOSIS — E538 Deficiency of other specified B group vitamins: Secondary | ICD-10-CM | POA: Diagnosis not present

## 2020-04-07 DIAGNOSIS — R7301 Impaired fasting glucose: Secondary | ICD-10-CM | POA: Diagnosis not present

## 2020-04-11 ENCOUNTER — Ambulatory Visit (INDEPENDENT_AMBULATORY_CARE_PROVIDER_SITE_OTHER): Payer: PPO

## 2020-04-11 DIAGNOSIS — J309 Allergic rhinitis, unspecified: Secondary | ICD-10-CM | POA: Diagnosis not present

## 2020-04-27 ENCOUNTER — Ambulatory Visit (INDEPENDENT_AMBULATORY_CARE_PROVIDER_SITE_OTHER): Payer: PPO

## 2020-04-27 DIAGNOSIS — J309 Allergic rhinitis, unspecified: Secondary | ICD-10-CM

## 2020-05-09 ENCOUNTER — Other Ambulatory Visit: Payer: Self-pay

## 2020-05-09 ENCOUNTER — Ambulatory Visit: Payer: PPO | Admitting: Allergy and Immunology

## 2020-05-09 ENCOUNTER — Encounter: Payer: Self-pay | Admitting: Allergy and Immunology

## 2020-05-09 VITALS — BP 130/82 | HR 72 | Temp 98.1°F | Resp 16 | Ht 64.0 in | Wt 137.4 lb

## 2020-05-09 DIAGNOSIS — H01119 Allergic dermatitis of unspecified eye, unspecified eyelid: Secondary | ICD-10-CM | POA: Diagnosis not present

## 2020-05-09 DIAGNOSIS — Z91038 Other insect allergy status: Secondary | ICD-10-CM

## 2020-05-09 DIAGNOSIS — J4520 Mild intermittent asthma, uncomplicated: Secondary | ICD-10-CM | POA: Diagnosis not present

## 2020-05-09 DIAGNOSIS — J3089 Other allergic rhinitis: Secondary | ICD-10-CM

## 2020-05-09 DIAGNOSIS — Z91030 Bee allergy status: Secondary | ICD-10-CM | POA: Diagnosis not present

## 2020-05-09 NOTE — Patient Instructions (Signed)
  1. Continue to Perform allergen avoidance measures   2. Continue Treat and prevent inflammation:   A. montelukast 10 mg tablet 1 time per day  B. Flonase 1-2 sprays each nostril one time per day  3. If needed:   A. Azelastine 1-2 sprays each nostril 1-2 times per day    B. OTC antihistamine - Allegra  C. nasal saline spray  D. EpiPen, Benadryl, M.D./ER evaluation for allergic reaction  E. Xopenex (levalbuterol) 2 puffs every 4-6 hours   F. Pataday one drop each eye one time per day  G. Elidel ointment 1-2 times a day     4. Continue immunotherapy  5. Return to clinic in 1 year or earlier if problem  7. Obtain fall flu vaccine

## 2020-05-09 NOTE — Progress Notes (Signed)
- [place] [facility] [place]   Follow-up Note  Referring Provider: No ref. provider found Primary Provider: Darcus Austin, MD (Inactive) Date of Office Visit: 05/09/2020  Subjective:   Carrie Cox (DOB: Jan 14, 1946) is a 74 y.o. female who returns to the [facility] on 05/09/2020 in re-evaluation of the following:  HPI: Carrie Cox returns to this clinic in evaluation of allergic rhinoconjunctivitis and a history of contact dermatitis involving her eyelids and a history of very intermittent asthma and a history of shellfish allergy and hymenoptera venom allergy.  Her last visit to this clinic was 16 November 2019.  She continues on immunotherapy currently at every 3 weeks without any adverse effect.    She is not had any significant upper airway symptoms.  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  She continues to use montelukast and Flonase on a consistent basis and on most days takes an antihistamine and nasal azelastine.  She has done very well with her asthma and basically has not had to use the short acting bronchodilator and has no limitation on ability to exercise and has not required a steroid to treat an exacerbation of asthma.  The contact dermatitis involving her eyes is under excellent control.  There has only been a few episodes when she has had to apply Elidel for a flareup of her eye condition.  Recently she did have a little flareup of her eye condition after cleaning up around dust.  After receiving the results of her shellfish IgE panel she has been able to eat a shrimp with no problem and eat a scallop with no problem.  However, if she eats many shrimp she does develop vomiting without any associated systemic or constitutional symptoms.  She has received 2 Pfizer vaccinations.  Allergies as of 05/09/2020      Reactions   Bee Venom Anaphylaxis   Allergic to yellow jackets    Shellfish Allergy Anaphylaxis   Aloe    Red rash (sunburn type)   Codeine Nausea And Vomiting   Doxycycline    Stomach pain   Erythromycin    Stomach pain   Nsaids Nausea Only   Terrible pains in stomach   Other    Pain meds in general --Nausea and vomiting Artificial Sweetners- vomiting and swelling Anti-inflammatories - "can't take oral anti-inflammatories it hurts my stomach - Nausea and stabbing stomach pain" [place]-Severe Rash/hives   Soy Allergy Hives      Medication List      ALPRAZolam 0.25 MG tablet Commonly known as: XANAX Take 0.25 mg by mouth at bedtime as needed for anxiety.   azelastine 0.1 % nasal spray Commonly known as: ASTELIN Place 1 spray into both nostrils 2 (two) times daily.   BENADRYL PO Take by mouth as needed.   carisoprodol 350 MG tablet Commonly known as: SOMA Take 350 mg by mouth 4 (four) times daily as needed for muscle spasms.   EPINEPHrine 0.3 mg/0.3 mL Soaj injection Commonly known as: EPI-PEN INJECT 0.3 MLS (0.3 MG TOTAL) INTO THE MUSCLE ONCE FOR 1 DOSE.   escitalopram 10 MG tablet Commonly known as: LEXAPRO Take 10 mg by mouth daily.   Estradiol 10 MCG Tabs vaginal tablet Commonly known as: Yuvafem INSERT 1 TABLET VAGINALLY TWICE TIMES A WEEK   fluticasone 50 MCG/ACT nasal spray Commonly known as: FLONASE 1-2 puffs each nostril once daily as needed   Fluzone High-Dose  0.5 ML injection Generic drug: Influenza vac split quadrivalent PF Fluzone High-Dose 2019-20 (PF) 180 mcg/0.5 mL intramuscular syringe  TO BE ADMINISTERED BY PHARMACIST FOR IMMUNIZATION   Fluzone High-Dose Quadrivalent 0.7 ML Susy Generic drug: Influenza Vac High-Dose Quad TO BE ADMINISTERED BY PHARMACIST FOR IMMUNIZATION   levalbuterol 45 MCG/ACT inhaler Commonly known as: Xopenex HFA Inhale 2 puffs into the lungs every 6 (six) hours as needed for wheezing.   montelukast 10 MG tablet Commonly known as: SINGULAIR TAKE 1 TABLET BY MOUTH EVERY DAY     NON FORMULARY [facility]  Anti-Inflammatory Cream- Diclofenac 3%, Baclofen 2%, Lidocaine 2% Apply 1-2 grams to affected area 3-4 times daily Qty. 120 gm 3 refills   Olopatadine HCl 0.2 % Soln Commonly known as: Pataday Place 1 drop into both eyes 1 day or 1 dose.   pimecrolimus 1 % cream Commonly known as: Elidel Use on eyelids BID prn for rash.   REPLENS [facility] Place vaginally.   valACYclovir 1000 MG tablet Commonly known as: VALTREX Take 1 tablet (1,000 mg total) by mouth daily as needed. For outbreak   VITAMIN B 12 PO Take by mouth.   VITAMIN D PO Take 2,000 Units by mouth daily.       Past Medical History:  Diagnosis Date  . ASCUS of cervix with negative high risk HPV 08/2018, 08/2019   10/2018 colposcopy ECC showed LGSIL  . Asthma   . Atrophic vaginitis    uses vagifem 3 X week to  . HSV infection   . Multiple allergies   . Neck injury   . Osteopenia 09/2018   T score -1.2 FRAX 9% / 1%.  Stable from prior DEXA    Past Surgical History:  Procedure Laterality Date  . ADENOIDECTOMY  1955  . CARPAL TUNNEL RELEASE Right 08/2016  . FOOT SURGERY     BILATERAL ORTHO  . LAPAROSCOPIC APPENDECTOMY N/A 03/04/2013   Procedure: APPENDECTOMY LAPAROSCOPIC;  Surgeon: Shann Medal, MD;  Location: [facility];  Service: General;  Laterality: N/A;  . MOUTH SURGERY    . TONSILLECTOMY     and adenoids  . TONSILLECTOMY  1955    Review of systems negative except as noted in HPI / PMHx or noted below:  Review of Systems  Constitutional: Negative.   HENT: Negative.   Eyes: Negative.   Respiratory: Negative.   Cardiovascular: Negative.   Gastrointestinal: Negative.   Genitourinary: Negative.   Musculoskeletal: Negative.   Skin: Negative.   Neurological: Negative.   Endo/Heme/Allergies: Negative.   Psychiatric/Behavioral: Negative.      Objective:   Vitals:   05/09/20 1106  BP: 130/82  Pulse: 72  Resp: 16  Temp: 98.1 F (36.7 C)  SpO2: 98%   Height: 5'  4" (162.6 cm)  Weight: 137 lb 6.4 oz (62.3 kg)   Physical Exam Constitutional:      Appearance: She is not diaphoretic.  HENT:     Head: Normocephalic.     Right Ear: Tympanic membrane, ear canal and external ear normal.     Left Ear: Tympanic membrane, ear canal and external ear normal.     Nose: Nose normal. No mucosal edema or rhinorrhea.     Mouth/Throat:     Pharynx: Uvula midline. No oropharyngeal exudate.  Eyes:     Conjunctiva/sclera: Conjunctivae normal.  Neck:     Thyroid: No thyromegaly.     Trachea: Trachea normal. No tracheal tenderness or tracheal deviation.  Cardiovascular:     Rate  and Rhythm: Normal rate and regular rhythm.     Heart sounds: Normal heart sounds, S1 normal and S2 normal. No murmur heard.   Pulmonary:     Effort: No respiratory distress.     Breath sounds: Normal breath sounds. No stridor. No wheezing or rales.  Lymphadenopathy:     Head:     Right side of head: No tonsillar adenopathy.     Left side of head: No tonsillar adenopathy.     Cervical: No cervical adenopathy.  Skin:    Findings: No erythema or rash.     Nails: There is no clubbing.  Neurological:     Mental Status: She is alert.     Diagnostics:    Results of blood tests obtained 16 November 2019 identified no IgE antibodies directed against a screening panel of shellfish or hymenoptera venom.  Assessment and Plan:   1. Perennial allergic rhinitis   2. Asthma, mild intermittent, well-controlled   3. Eyelid dermatitis, allergic/contact   4. History of systemic reaction to hymenoptera     1. Continue to Perform allergen avoidance measures   2. Continue Treat and prevent inflammation:   A. montelukast 10 mg tablet 1 time per day  B. Flonase 1-2 sprays each nostril one time per day  3. If needed:   A. Azelastine 1-2 sprays each nostril 1-2 times per day    B. OTC antihistamine - Allegra  C. nasal saline spray  D. EpiPen, Benadryl, M.D./ER evaluation for allergic  reaction  E. Xopenex (levalbuterol) 2 puffs every 4-6 hours   F. Pataday one drop each eye one time per day  G. Elidel ointment 1-2 times a day     4. Continue immunotherapy  5. Return to clinic in 1 year or earlier if problem  7. Obtain fall flu vaccine  Mayelin appears to be doing quite well on her current plan which includes immunotherapy and a leukotriene modifier and a nasal steroid and occasionally some topical anti-inflammatory medication for her skin.  She will continue on this plan and I will see her back in this clinic in 1 year or earlier if there is a problem.  Allena Katz, MD Allergy / Immunology [place]

## 2020-05-10 ENCOUNTER — Encounter: Payer: Self-pay | Admitting: Allergy and Immunology

## 2020-05-10 DIAGNOSIS — Z1231 Encounter for screening mammogram for malignant neoplasm of breast: Secondary | ICD-10-CM | POA: Diagnosis not present

## 2020-05-19 ENCOUNTER — Ambulatory Visit (INDEPENDENT_AMBULATORY_CARE_PROVIDER_SITE_OTHER): Payer: PPO

## 2020-05-19 DIAGNOSIS — J309 Allergic rhinitis, unspecified: Secondary | ICD-10-CM | POA: Diagnosis not present

## 2020-05-23 ENCOUNTER — Other Ambulatory Visit: Payer: Self-pay

## 2020-05-23 MED ORDER — ESTRADIOL 10 MCG VA TABS: ORAL_TABLET | VAGINAL | 0 refills | Status: DC

## 2020-05-25 MED ORDER — ESTRADIOL 10 MCG VA TABS: ORAL_TABLET | VAGINAL | 0 refills | Status: DC

## 2020-05-25 NOTE — Addendum Note (Signed)
Addended by: Thamas Jaegers on: 05/25/2020 10:16 AM   Modules accepted: Orders

## 2020-06-06 ENCOUNTER — Ambulatory Visit (INDEPENDENT_AMBULATORY_CARE_PROVIDER_SITE_OTHER): Payer: PPO

## 2020-06-06 DIAGNOSIS — J309 Allergic rhinitis, unspecified: Secondary | ICD-10-CM

## 2020-06-16 ENCOUNTER — Other Ambulatory Visit: Payer: Self-pay | Admitting: Obstetrics and Gynecology

## 2020-06-27 DIAGNOSIS — M545 Low back pain, unspecified: Secondary | ICD-10-CM | POA: Insufficient documentation

## 2020-06-27 DIAGNOSIS — M5416 Radiculopathy, lumbar region: Secondary | ICD-10-CM | POA: Diagnosis not present

## 2020-06-29 ENCOUNTER — Ambulatory Visit (INDEPENDENT_AMBULATORY_CARE_PROVIDER_SITE_OTHER): Payer: PPO

## 2020-06-29 DIAGNOSIS — J309 Allergic rhinitis, unspecified: Secondary | ICD-10-CM

## 2020-07-17 DIAGNOSIS — R7303 Prediabetes: Secondary | ICD-10-CM | POA: Diagnosis not present

## 2020-07-21 DIAGNOSIS — M5136 Other intervertebral disc degeneration, lumbar region: Secondary | ICD-10-CM | POA: Insufficient documentation

## 2020-07-25 ENCOUNTER — Ambulatory Visit (INDEPENDENT_AMBULATORY_CARE_PROVIDER_SITE_OTHER): Payer: PPO | Admitting: *Deleted

## 2020-07-25 DIAGNOSIS — J309 Allergic rhinitis, unspecified: Secondary | ICD-10-CM

## 2020-08-02 DIAGNOSIS — J3081 Allergic rhinitis due to animal (cat) (dog) hair and dander: Secondary | ICD-10-CM | POA: Diagnosis not present

## 2020-08-02 NOTE — Progress Notes (Signed)
VIALS EXP 08-02-21 

## 2020-08-03 DIAGNOSIS — J3089 Other allergic rhinitis: Secondary | ICD-10-CM | POA: Diagnosis not present

## 2020-08-24 ENCOUNTER — Ambulatory Visit (INDEPENDENT_AMBULATORY_CARE_PROVIDER_SITE_OTHER): Payer: PPO | Admitting: *Deleted

## 2020-08-24 DIAGNOSIS — J309 Allergic rhinitis, unspecified: Secondary | ICD-10-CM

## 2020-09-05 NOTE — Progress Notes (Signed)
Subjective:     Patient ID: Carrie Cox, female   DOB: 1946/02/12, 74 y.o.   MRN: [id number]  HPI F never smoker followed  for chronic rhinitis and asthma.  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 Asks if I could draw labs for B12 level and thyroid function ( TSH was elevated at 5.14) to have ready. Asthma has been well controlled with Covid precautions. Had flu vax. Discussed Covid vax. Describes 2-3 episodes of idiopathic eyelid swelling, not clearly related to meds, exposures or known allergies. Has discussed with her allergist/ Dr Neldon Mc. She asks if we could give short course prednisone to hold for this. AH haven't helped. She is cat sensitive, has a cat which is sequestered, and nothing new. Diet shifting to vegetarian.  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 Denies heart or other systemic concerns. Shows large bruise L shin where hit by branch she was cutting.   ROS-see HPI    + = positive Constitutional:   No-   weight loss, night sweats, fevers, chills, fatigue, lassitude. HEENT:   No-  headaches, difficulty swallowing, tooth/dental problems, sore throat,       No-  sneezing, itching, ear ache, nasal congestion, post nasal drip,  CV:  No-   chest pain, orthopnea, PND,  swelling in lower extremities, anasarca,                                                         dizziness, palpitations Resp: No-   shortness of breath with exertion or at rest.              No-   productive cough,  No non-productive cough,  No- coughing up of blood.              No-   change in color of mucus.  No- wheezing.   Skin: No-   rash or lesions. GI:  No-   heartburn, indigestion, abdominal pain, nausea, vomiting,  GU:  MS:  No-   joint pain or swelling.  Neuro-     nothing unusual Psych:  No- change in mood or affect. No depression or anxiety.  No memory loss.  OBJ- Physical Exam General- Alert, Oriented, Affect-appropriate, Distress- none acute Skin- rash-none, lesions- none, excoriation- none Lymphadenopathy- none Head- atraumatic            Eyes- Gross vision intact, PERRLA, conjunctivae and secretions clear            Ears- Hearing, canals-normal            Nose- Clear, no-Septal dev, mucus, polyps, erosion, perforation             Throat- Mallampati II , mucosa clear , drainage- none, tonsils- atrophic Neck-  flexible , trachea midline, no stridor , thyroid nl, carotid no bruit Chest - symmetrical excursion , unlabored           Heart/CV- RRR , no murmur , no gallop  , no rub, nl s1 s2                           - JVD- none , edema- none, stasis changes- none, varices- none           Lung- clear to P&A, wheeze- none, cough- none , dullness-none, rub- none           Chest wall-  Abd-  Br/ Gen/ Rectal- Not done, not indicated Extrem- cyanosis- none, clubbing, none, atrophy- none, strength- nl, Neuro- grossly intact to observation

## 2020-09-06 ENCOUNTER — Telehealth: Payer: Self-pay | Admitting: Internal Medicine

## 2020-09-06 ENCOUNTER — Encounter: Payer: Self-pay | Admitting: Internal Medicine

## 2020-09-06 ENCOUNTER — Other Ambulatory Visit: Payer: Self-pay

## 2020-09-06 ENCOUNTER — Ambulatory Visit: Payer: PPO | Admitting: Internal Medicine

## 2020-09-06 DIAGNOSIS — J302 Other seasonal allergic rhinitis: Secondary | ICD-10-CM

## 2020-09-06 DIAGNOSIS — J4520 Mild intermittent asthma, uncomplicated: Secondary | ICD-10-CM

## 2020-09-06 DIAGNOSIS — J3089 Other allergic rhinitis: Secondary | ICD-10-CM

## 2020-09-06 MED ORDER — AZITHROMYCIN 250 MG PO TABS: ORAL_TABLET | ORAL | 2 refills | Status: DC

## 2020-09-06 MED ORDER — AZELASTINE HCL 0.1 % NA SOLN
1.0000 | Freq: Two times a day (BID) | NASAL | 3 refills | Status: DC
Start: 2020-09-06 — End: 2022-05-21

## 2020-09-06 MED ORDER — MONTELUKAST SODIUM 10 MG PO TABS
10.0000 mg | ORAL_TABLET | Freq: Every day | ORAL | 3 refills | Status: DC
Start: 2020-09-06 — End: 2021-09-06

## 2020-09-06 MED ORDER — LEVALBUTEROL TARTRATE 45 MCG/ACT IN AERO: 2.0000 | INHALATION_SPRAY | Freq: Four times a day (QID) | RESPIRATORY_TRACT | 12 refills | Status: DC | PRN

## 2020-09-06 MED ORDER — EPINEPHRINE 0.3 MG/0.3ML IJ SOAJ: INTRAMUSCULAR | 12 refills | Status: DC

## 2020-09-06 NOTE — Telephone Encounter (Signed)
ATC patient x1 for clarification.  Left VM to return call.

## 2020-09-06 NOTE — Assessment & Plan Note (Signed)
Uncomplicated Plan- refill meds

## 2020-09-06 NOTE — Assessment & Plan Note (Signed)
Continues allergy vaccine from her allergists. Asks refill Zpak to hold, Astelin nasal spray, Epipen to hold.

## 2020-09-06 NOTE — Patient Instructions (Signed)
Med refill sent  Please call if we can help 

## 2020-09-09 NOTE — Telephone Encounter (Signed)
Lm for patient x2. Will close encounter per office protocol.

## 2020-09-11 NOTE — Telephone Encounter (Signed)
Pt's immunization list has been updated. Called and spoke with pt letting her know this had been done and she verbalized understanding.nothing further needed.

## 2020-09-18 ENCOUNTER — Ambulatory Visit (INDEPENDENT_AMBULATORY_CARE_PROVIDER_SITE_OTHER): Payer: PPO

## 2020-09-18 DIAGNOSIS — J309 Allergic rhinitis, unspecified: Secondary | ICD-10-CM

## 2020-09-26 ENCOUNTER — Encounter: Payer: Self-pay | Admitting: Obstetrics & Gynecology

## 2020-09-26 ENCOUNTER — Other Ambulatory Visit: Payer: Self-pay

## 2020-09-26 ENCOUNTER — Ambulatory Visit (INDEPENDENT_AMBULATORY_CARE_PROVIDER_SITE_OTHER): Payer: PPO | Admitting: Obstetrics & Gynecology

## 2020-09-26 VITALS — BP 140/90 | Ht 64.0 in | Wt 127.8 lb

## 2020-09-26 DIAGNOSIS — B009 Herpesviral infection, unspecified: Secondary | ICD-10-CM

## 2020-09-26 DIAGNOSIS — R87610 Atypical squamous cells of undetermined significance on cytologic smear of cervix (ASC-US): Secondary | ICD-10-CM | POA: Diagnosis not present

## 2020-09-26 DIAGNOSIS — B3731 Acute candidiasis of vulva and vagina: Secondary | ICD-10-CM

## 2020-09-26 DIAGNOSIS — N952 Postmenopausal atrophic vaginitis: Secondary | ICD-10-CM

## 2020-09-26 DIAGNOSIS — B373 Candidiasis of vulva and vagina: Secondary | ICD-10-CM

## 2020-09-26 DIAGNOSIS — M8580 Other specified disorders of bone density and structure, unspecified site: Secondary | ICD-10-CM

## 2020-09-26 DIAGNOSIS — Z01419 Encounter for gynecological examination (general) (routine) without abnormal findings: Secondary | ICD-10-CM | POA: Diagnosis not present

## 2020-09-26 DIAGNOSIS — Z9289 Personal history of other medical treatment: Secondary | ICD-10-CM | POA: Diagnosis not present

## 2020-09-26 MED ORDER — FLUCONAZOLE 150 MG PO TABS: 150.0000 mg | ORAL_TABLET | Freq: Once | ORAL | 3 refills | Status: AC

## 2020-09-26 MED ORDER — ESTRADIOL 10 MCG VA TABS
1.0000 | ORAL_TABLET | VAGINAL | 4 refills | Status: DC
Start: 2020-09-28 — End: 2021-11-12

## 2020-09-26 MED ORDER — VALACYCLOVIR HCL 1 G PO TABS: 1000.0000 mg | ORAL_TABLET | Freq: Every day | ORAL | 3 refills | Status: AC

## 2020-09-26 NOTE — Progress Notes (Signed)
Carrie Cox 11-29-1945 [id number]   History:    74 y.o. G1P1L1 Married  RP:  Established patient presenting for annual gyn exam   HPI: Postmenopause well on no systemic HRT.  No PMB.  No pelvic pain.  Sexually active.  Using Vagifem twice a week and Replens daily.  Breast normal.  Urine and bowel movements normal.  Uses valacyclovir for occasional HSV recurrences.  Body mass index 21.94.  Physically active.  Healthy nutrition.  Health labs with family physician.  Colonoscopy 2018.  Past medical history,surgical history, family history and social history were all reviewed and documented in the EPIC chart.  Gynecologic History No LMP recorded. Patient is postmenopausal.  Obstetric History OB History  Gravida Para Term Preterm AB Living  1 1 1     1   SAB TAB Ectopic Multiple Live Births               # Outcome Date GA Lbr Len/2nd Weight Sex Delivery Anes PTL Lv  1 Term              ROS: A ROS was performed and pertinent positives and negatives are included in the history.  GENERAL: No fevers or chills. HEENT: No change in vision, no earache, sore throat or sinus congestion. NECK: No pain or stiffness. CARDIOVASCULAR: No chest pain or pressure. No palpitations. PULMONARY: No shortness of breath, cough or wheeze. GASTROINTESTINAL: No abdominal pain, nausea, vomiting or diarrhea, melena or bright red blood per rectum. GENITOURINARY: No urinary frequency, urgency, hesitancy or dysuria. MUSCULOSKELETAL: No joint or muscle pain, no back pain, no recent trauma. DERMATOLOGIC: No rash, no itching, no lesions. ENDOCRINE: No polyuria, polydipsia, no heat or cold intolerance. No recent change in weight. HEMATOLOGICAL: No anemia or easy bruising or bleeding. NEUROLOGIC: No headache, seizures, numbness, tingling or weakness. PSYCHIATRIC: No depression, no loss of interest in normal activity or change in sleep pattern.     Exam:   BP 140/90   Ht 5\' 4"  (1.626 m)   Wt 127 lb 12.8 oz (58 kg)    BMI 21.94 kg/m   Body mass index is 21.94 kg/m.  General appearance : Well developed well nourished female. No acute distress HEENT: Eyes: no retinal hemorrhage or exudates,  Neck supple, trachea midline, no carotid bruits, no thyroidmegaly Lungs: Clear to auscultation, no rhonchi or wheezes, or rib retractions  Heart: Regular rate and rhythm, no murmurs or gallops Breast:Examined in sitting and supine position were symmetrical in appearance, no palpable masses or tenderness,  no skin retraction, no nipple inversion, no nipple discharge, no skin discoloration, no axillary or supraclavicular lymphadenopathy Abdomen: no palpable masses or tenderness, no rebound or guarding Extremities: no edema or skin discoloration or tenderness  Pelvic: Vulva: Normal             Vagina: No gross lesions or discharge  Cervix: No gross lesions or discharge.  Pap reflex done.  Uterus  AV, normal size, shape and consistency, non-tender and mobile  Adnexa  Without masses or tenderness  Anus: Normal   Assessment/Plan:  74 y.o. female for annual exam   1. Encounter for routine gynecological examination with Papanicolaou smear of cervix Normal gynecologic exam in menopause.  Pap reflex done today.  Breast exam normal.  Screening mammogram July 2021 at [facility], will obtain report.  Per patient, mammowas negative.  Colonoscopy 2018.  Good body mass index at 21.94.  Continue with fitness and healthy nutrition.  2. ASCUS of cervix with  negative high risk HPV Pap reflex done today.  3. Postmenopausal atrophic vaginitis Well on Yuvafem twice a week.  No contraindication to continue.  Prescription sent to pharmacy.  Also using Replens.  4. Osteopenia, unspecified location Osteopenia on bone density December 2019.  Repeat bone density now.  Vitamin D supplements, calcium intake of 1500 mg daily and regular weightbearing physical activities to continue. - DG Bone Density; Future  5. HSV infection Occasional HSV  recurrences.  Valacyclovir represcribed.  6. Yeast vaginitis Tendency for yeast vaginitis after upper respiratory tract infection treated with antibiotics.  Fluconazole prescription sent to pharmacy.  Other orders - Estradiol (YUVAFEM) 10 MCG TABS vaginal tablet; Place 1 tablet (10 mcg total) vaginally 2 (two) times a week. - valACYclovir (VALTREX) 1000 MG tablet; Take 1 tablet (1,000 mg total) by mouth daily for 5 days. For outbreak - fluconazole (DIFLUCAN) 150 MG tablet; Take 1 tablet (150 mg total) by mouth once for 1 dose.  Princess Bruins MD, 10:59 AM 09/26/2020

## 2020-09-27 ENCOUNTER — Encounter: Payer: Self-pay | Admitting: Obstetrics & Gynecology

## 2020-09-28 LAB — PAP IG W/ RFLX HPV ASCU

## 2020-10-11 DIAGNOSIS — E039 Hypothyroidism, unspecified: Secondary | ICD-10-CM | POA: Diagnosis not present

## 2020-10-11 DIAGNOSIS — M8589 Other specified disorders of bone density and structure, multiple sites: Secondary | ICD-10-CM | POA: Diagnosis not present

## 2020-10-11 DIAGNOSIS — Z79899 Other long term (current) drug therapy: Secondary | ICD-10-CM | POA: Diagnosis not present

## 2020-10-11 DIAGNOSIS — R7303 Prediabetes: Secondary | ICD-10-CM | POA: Diagnosis not present

## 2020-10-11 DIAGNOSIS — M62838 Other muscle spasm: Secondary | ICD-10-CM | POA: Diagnosis not present

## 2020-10-11 DIAGNOSIS — E7800 Pure hypercholesterolemia, unspecified: Secondary | ICD-10-CM | POA: Diagnosis not present

## 2020-10-11 DIAGNOSIS — Z0000 Encounter for general adult medical examination without abnormal findings: Secondary | ICD-10-CM | POA: Diagnosis not present

## 2020-10-11 DIAGNOSIS — E538 Deficiency of other specified B group vitamins: Secondary | ICD-10-CM | POA: Diagnosis not present

## 2020-10-18 ENCOUNTER — Ambulatory Visit (INDEPENDENT_AMBULATORY_CARE_PROVIDER_SITE_OTHER): Payer: PPO

## 2020-10-18 DIAGNOSIS — J309 Allergic rhinitis, unspecified: Secondary | ICD-10-CM

## 2020-10-26 ENCOUNTER — Ambulatory Visit (INDEPENDENT_AMBULATORY_CARE_PROVIDER_SITE_OTHER): Payer: PPO | Admitting: *Deleted

## 2020-10-26 DIAGNOSIS — J309 Allergic rhinitis, unspecified: Secondary | ICD-10-CM

## 2020-11-02 ENCOUNTER — Ambulatory Visit (INDEPENDENT_AMBULATORY_CARE_PROVIDER_SITE_OTHER): Payer: PPO

## 2020-11-02 DIAGNOSIS — J309 Allergic rhinitis, unspecified: Secondary | ICD-10-CM

## 2020-11-07 ENCOUNTER — Ambulatory Visit (INDEPENDENT_AMBULATORY_CARE_PROVIDER_SITE_OTHER): Payer: PPO

## 2020-11-07 ENCOUNTER — Other Ambulatory Visit: Payer: Self-pay | Admitting: Obstetrics & Gynecology

## 2020-11-07 ENCOUNTER — Other Ambulatory Visit: Payer: Self-pay

## 2020-11-07 DIAGNOSIS — Z780 Asymptomatic menopausal state: Secondary | ICD-10-CM

## 2020-11-07 DIAGNOSIS — M8589 Other specified disorders of bone density and structure, multiple sites: Secondary | ICD-10-CM | POA: Diagnosis not present

## 2020-11-07 DIAGNOSIS — M8580 Other specified disorders of bone density and structure, unspecified site: Secondary | ICD-10-CM

## 2020-11-07 DIAGNOSIS — J309 Allergic rhinitis, unspecified: Secondary | ICD-10-CM

## 2020-11-15 ENCOUNTER — Ambulatory Visit (INDEPENDENT_AMBULATORY_CARE_PROVIDER_SITE_OTHER): Payer: PPO

## 2020-11-15 DIAGNOSIS — J309 Allergic rhinitis, unspecified: Secondary | ICD-10-CM

## 2020-12-12 ENCOUNTER — Ambulatory Visit (INDEPENDENT_AMBULATORY_CARE_PROVIDER_SITE_OTHER): Payer: PPO | Admitting: *Deleted

## 2020-12-12 DIAGNOSIS — J309 Allergic rhinitis, unspecified: Secondary | ICD-10-CM

## 2021-01-10 ENCOUNTER — Ambulatory Visit (INDEPENDENT_AMBULATORY_CARE_PROVIDER_SITE_OTHER): Payer: PPO

## 2021-01-10 DIAGNOSIS — J309 Allergic rhinitis, unspecified: Secondary | ICD-10-CM

## 2021-01-19 DIAGNOSIS — Z961 Presence of intraocular lens: Secondary | ICD-10-CM | POA: Diagnosis not present

## 2021-01-19 DIAGNOSIS — H52203 Unspecified astigmatism, bilateral: Secondary | ICD-10-CM | POA: Diagnosis not present

## 2021-01-30 ENCOUNTER — Ambulatory Visit (INDEPENDENT_AMBULATORY_CARE_PROVIDER_SITE_OTHER): Payer: PPO | Admitting: *Deleted

## 2021-01-30 DIAGNOSIS — J309 Allergic rhinitis, unspecified: Secondary | ICD-10-CM

## 2021-02-27 ENCOUNTER — Ambulatory Visit (INDEPENDENT_AMBULATORY_CARE_PROVIDER_SITE_OTHER): Payer: PPO | Admitting: *Deleted

## 2021-02-27 DIAGNOSIS — J309 Allergic rhinitis, unspecified: Secondary | ICD-10-CM

## 2021-03-21 DIAGNOSIS — J3081 Allergic rhinitis due to animal (cat) (dog) hair and dander: Secondary | ICD-10-CM | POA: Diagnosis not present

## 2021-03-21 NOTE — Progress Notes (Signed)
VIALS EXP 03-19-24

## 2021-03-22 DIAGNOSIS — J3089 Other allergic rhinitis: Secondary | ICD-10-CM | POA: Diagnosis not present

## 2021-03-29 ENCOUNTER — Ambulatory Visit (INDEPENDENT_AMBULATORY_CARE_PROVIDER_SITE_OTHER): Payer: PPO | Admitting: *Deleted

## 2021-03-29 DIAGNOSIS — J309 Allergic rhinitis, unspecified: Secondary | ICD-10-CM | POA: Diagnosis not present

## 2021-05-01 ENCOUNTER — Ambulatory Visit (INDEPENDENT_AMBULATORY_CARE_PROVIDER_SITE_OTHER): Payer: PPO | Admitting: *Deleted

## 2021-05-01 DIAGNOSIS — J309 Allergic rhinitis, unspecified: Secondary | ICD-10-CM

## 2021-05-11 ENCOUNTER — Encounter: Payer: Self-pay | Admitting: Obstetrics & Gynecology

## 2021-05-11 DIAGNOSIS — Z1231 Encounter for screening mammogram for malignant neoplasm of breast: Secondary | ICD-10-CM | POA: Diagnosis not present

## 2021-05-15 ENCOUNTER — Other Ambulatory Visit: Payer: Self-pay

## 2021-05-15 ENCOUNTER — Encounter: Payer: Self-pay | Admitting: Allergy and Immunology

## 2021-05-15 ENCOUNTER — Ambulatory Visit: Payer: PPO | Admitting: Allergy and Immunology

## 2021-05-15 VITALS — BP 162/82 | HR 76 | Resp 16

## 2021-05-15 DIAGNOSIS — J3089 Other allergic rhinitis: Secondary | ICD-10-CM

## 2021-05-15 DIAGNOSIS — J4520 Mild intermittent asthma, uncomplicated: Secondary | ICD-10-CM

## 2021-05-15 DIAGNOSIS — Z91038 Other insect allergy status: Secondary | ICD-10-CM | POA: Diagnosis not present

## 2021-05-15 DIAGNOSIS — H01119 Allergic dermatitis of unspecified eye, unspecified eyelid: Secondary | ICD-10-CM | POA: Diagnosis not present

## 2021-05-15 DIAGNOSIS — T781XXD Other adverse food reactions, not elsewhere classified, subsequent encounter: Secondary | ICD-10-CM

## 2021-05-15 MED ORDER — PIMECROLIMUS 1 % EX CREA: TOPICAL_CREAM | CUTANEOUS | 1 refills | Status: DC

## 2021-05-15 MED ORDER — DESONIDE 0.05 % EX OINT: 1.0000 "application " | TOPICAL_OINTMENT | Freq: Two times a day (BID) | CUTANEOUS | 3 refills | Status: DC

## 2021-05-15 MED ORDER — OLOPATADINE HCL 0.2 % OP SOLN: 1.0000 [drp] | OPHTHALMIC | 12 refills | Status: DC

## 2021-05-15 MED ORDER — MUPIROCIN 2 % EX OINT: TOPICAL_OINTMENT | CUTANEOUS | 0 refills | Status: DC

## 2021-05-15 NOTE — Patient Instructions (Addendum)
  1. Check Area 2 aeroallergen IgE profile  2. Continue Treat and prevent inflammation of airway:   A. montelukast 10 mg tablet 1 time per day  B. Flonase 1-2 sprays each nostril one time per day  C. Immunotherapy  3. Treat and prevent inflammation of eye/lid:   A. Pataday one drop each eye one time per day  B. Elidel ointment + Desonide ointment 2 times a day during flare up  C. Consider preventative dose of Elidil + Desonide (2-3 times per week)  4. Treat ear abrasion with Bactroban 3 times a day for the next 10 days  5. If needed:   A. Azelastine 1-2 sprays each nostril 1-2 times per day    B. OTC antihistamine - Allegra  C. nasal saline spray  D. EpiPen, Benadryl, M.D./ER evaluation for allergic reaction  E. Xopenex (levalbuterol) 2 puffs every 4-6 hours     6. Further evaluation?  7. Return to clinic in 1 year or earlier if problem  8. Obtain fall flu vaccine

## 2021-05-15 NOTE — Progress Notes (Signed)
[place]   Follow-up Note  Referring Provider: Glenis Smoker, * Primary Provider: Glenis Smoker, MD Date of Office Visit: 05/15/2021  Subjective:   Carrie Cox (DOB: September 17, 1946) is a 75 y.o. female who returns to the [facility] [place] on 05/15/2021 in re-evaluation of the following:  HPI: Davyn returns to this clinic in evaluation of allergic rhinoconjunctivitis and a history of contact dermatitis and a distant history of intermittent asthma and a distant history of shellfish allergy and hymenoptera venom allergy.  Her last visit to this clinic was 09 May 2020.  She is undergoing a course of immunotherapy and is doing quite well regarding her airway issue while also using nasal fluticasone and nasal azelastine.  But her eyes have been causing her problem.  She apparently has a supply of prednisone that she will utilize for 4 to 5 days whenever her eyes flareup and become red and swollen.  It is predominantly unilateral swelling that occurs rather than bilateral.  She does not really have any discharge from her eye or pain from her eye but it is just the eyelid and periorbital region that ends up swelling.  She will take prednisone for several days and also start her Elidel application and within a week or so she has significant improvement.  She is firmly convinced that she is allergic to grass and trees during the spring.  Previous testing for pollen allergy has been negative in the past.  Recently she excoriated her left ear and she had some bleeding in that area.  That was about 2 weeks ago and for the past 3 days she has applied a supply of Bactroban that she had at home.  She has had no issues with asthma and continues to follow-up with Dr. Keturah Barre regarding this issue.  She remains away from the consumption of shellfish as it does produce some GI upset.  Previous evaluation for her shellfish allergy  documented a negative shellfish IgE panel.  She was stung by a yellow jacket recently and only had a local reaction without any systemic symptoms.  Previous evaluation for hymenoptera allergy documented a negative hymenoptera venom IgE panel.  She has received [place] vaccinations and a flu vaccine over the course of the past year.  Allergies as of 05/15/2021       Reactions   Bee Venom Anaphylaxis   Allergic to yellow jackets   Shellfish Allergy Anaphylaxis   Aloe    Red rash (sunburn type)   Codeine Nausea And Vomiting   Doxycycline    Stomach pain   Erythromycin    Stomach pain   Nsaids Nausea Only   Terrible pains in stomach   Other    Pain meds in general --Nausea and vomiting Artificial Sweetners- vomiting and swelling Anti-inflammatories - "can't take oral anti-inflammatories it hurts my stomach - Nausea and stabbing stomach pain" [place]-Severe Rash/hives   Soy Allergy Hives        Medication List    ALPRAZolam 0.25 MG tablet Commonly known as: XANAX Take 0.25 mg by mouth at bedtime as needed for anxiety.   azelastine 0.1 % nasal spray Commonly known as: ASTELIN Place 1 spray into both nostrils 2 (two) times daily.   BENADRYL PO Take by mouth as needed.   EPINEPHrine 0.3 mg/0.3 mL Soaj injection Commonly known as: EPI-PEN INJECT 0.3 MLS (0.3 MG TOTAL) INTO THE MUSCLE ONCE FOR 1 DOSE.  escitalopram 10 MG tablet Commonly known as: LEXAPRO Take 10 mg by mouth daily.   Estradiol 10 MCG Tabs vaginal tablet Commonly known as: Yuvafem Place 1 tablet (10 mcg total) vaginally 2 (two) times a week.   fluticasone 50 MCG/ACT nasal spray Commonly known as: FLONASE 1-2 puffs each nostril once daily as needed   Fluzone High-Dose 0.5 ML injection Generic drug: Influenza vac split quadrivalent PF Fluzone High-Dose 2019-20 (PF) 180 mcg/0.5 mL intramuscular syringe  TO BE ADMINISTERED BY PHARMACIST FOR IMMUNIZATION   Fluzone High-Dose Quadrivalent 0.7 ML  [place] Generic drug: Influenza Vac High-Dose Quad TO BE ADMINISTERED BY PHARMACIST FOR IMMUNIZATION   levalbuterol 45 MCG/ACT inhaler Commonly known as: Xopenex HFA Inhale 2 puffs into the lungs every 6 (six) hours as needed for wheezing.   montelukast 10 MG tablet Commonly known as: SINGULAIR Take 1 tablet (10 mg total) by mouth daily.   mupirocin ointment 2 % Commonly known as: BACTROBAN 1 application 3 times a day for 10 days only Started by: Sabrine Patchen Kevan Rosebush, MD   NON [place]  Anti-Inflammatory Cream- Diclofenac 3%, Baclofen 2%, Lidocaine 2% Apply 1-2 grams to affected area 3-4 times daily Qty. 120 gm 3 refills   Olopatadine HCl 0.2 % Soln Commonly known as: Pataday Place 1 drop into both eyes 1 day or 1 dose.   pimecrolimus 1 % cream Commonly known as: Elidel Use on eyelids BID prn for rash.   REPLENS VA Place vaginally.   VITAMIN B 12 PO Take by mouth.   VITAMIN D PO Take 2,000 Units by mouth daily.        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  Stable from prior DEXA    Past Surgical History:  Procedure Laterality Date   [place] Right 08/2016   FOOT SURGERY     BILATERAL ORTHO   LAPAROSCOPIC APPENDECTOMY N/A 03/04/2013   Procedure: APPENDECTOMY LAPAROSCOPIC;  Surgeon: Shann Medal, MD;  Location: [facility];  Service: General;  Laterality: N/A;   MOUTH SURGERY     TONSILLECTOMY     and adenoids   TONSILLECTOMY  1955    Review of systems negative except as noted in HPI / PMHx or noted below:  Review of Systems  Constitutional: Negative.   HENT: Negative.    Eyes: Negative.   Respiratory: Negative.    Cardiovascular: Negative.    Gastrointestinal: Negative.   Genitourinary: Negative.   Musculoskeletal: Negative.   Skin: Negative.   Neurological: Negative.   Endo/Heme/Allergies: Negative.   Psychiatric/Behavioral: Negative.      Objective:   Vitals:   05/15/21 1121  BP: (!) 162/82  Pulse: 76  Resp: 16  SpO2: 97%          Physical Exam Constitutional:      Appearance: She is not diaphoretic.  HENT:     Head: Normocephalic.     Right Ear: Tympanic membrane, ear canal and external ear normal.     Left Ear: Tympanic membrane, ear canal and external ear normal.     Nose: Nose normal. No mucosal edema  or rhinorrhea.     Mouth/Throat:     Pharynx: Uvula midline. No oropharyngeal exudate.  Eyes:     Conjunctiva/sclera: Conjunctivae normal.     Comments: Slight erythema of right periorbital region  Neck:     Thyroid: No thyromegaly.     Trachea: Trachea normal. No tracheal tenderness or tracheal deviation.  Cardiovascular:     Rate and Rhythm: Normal rate and regular rhythm.     Heart sounds: Normal heart sounds, S1 normal and S2 normal. No murmur heard. Pulmonary:     Effort: No respiratory distress.     Breath sounds: Normal breath sounds. No stridor. No wheezing or rales.  Lymphadenopathy:     Head:     Right side of head: No tonsillar adenopathy.     Left side of head: No tonsillar adenopathy.     Cervical: No cervical adenopathy.  Skin:    Findings: No erythema or rash (1 cm x 0.5 cm excoriated area left pinna).     Nails: There is no clubbing.  Neurological:     Mental Status: She is alert.    Diagnostics: none  Assessment and Plan:   1. Perennial allergic rhinitis   2. Asthma, mild intermittent, well-controlled   3. Eyelid dermatitis, allergic/contact   4. History of systemic reaction to hymenoptera   5. Adverse food reaction, subsequent encounter     1. Check Area 2 aeroallergen IgE profile  2. Continue Treat and prevent inflammation of airway:   A. montelukast 10 mg  tablet 1 time per day  B. Flonase 1-2 sprays each nostril one time per day  C. Immunotherapy  3. Treat and prevent inflammation of eye/lid:   A. Pataday one drop each eye one time per day  B. Elidel ointment + Desonide ointment 2 times a day during flare up  C. Consider preventative dose of Elidil + Desonide (2-3 times per week)  4. Treat ear abrasion with Bactroban 3 times a day for the next 10 days  5. If needed:   A. Azelastine 1-2 sprays each nostril 1-2 times per day    B. OTC antihistamine - Allegra  C. nasal saline spray  D. EpiPen, Benadryl, M.D./ER evaluation for allergic reaction  E. Xopenex (levalbuterol) 2 puffs every 4-6 hours     6. Further evaluation?  7. Return to clinic in 1 year or earlier if problem  8. Obtain fall flu vaccine  We will further explore the possibility that Kyleena has acquired a allergy directed against grass and trees in addition to her cat allergy by checking a area two aero allergen IgE profile.  I will contact her with the results of the blood test once it is available for review.  I cautioned her about the use of prednisone on such a frequent basis as she is currently using and we will try to have her use a preventative dose of Elidel and desonide to her eyelid may be averaging out to 2-3 times per week to prevent her from developing swelling of this part of her face.  She will treat her ear abrasion with Bactroban and I will assume that this will clear up this issue within the next week or so.  We will see her back in this clinic in 1 year or earlier if there is a problem.  Allena Katz, MD Allergy / Immunology [place]

## 2021-05-16 ENCOUNTER — Encounter: Payer: Self-pay | Admitting: Allergy and Immunology

## 2021-05-17 ENCOUNTER — Encounter: Payer: Self-pay | Admitting: Allergy and Immunology

## 2021-05-18 LAB — ALLERGENS W/TOTAL IGE AREA 2

## 2021-06-01 ENCOUNTER — Ambulatory Visit (INDEPENDENT_AMBULATORY_CARE_PROVIDER_SITE_OTHER): Payer: PPO

## 2021-06-01 DIAGNOSIS — J309 Allergic rhinitis, unspecified: Secondary | ICD-10-CM | POA: Diagnosis not present

## 2021-06-07 ENCOUNTER — Ambulatory Visit (INDEPENDENT_AMBULATORY_CARE_PROVIDER_SITE_OTHER): Payer: PPO | Admitting: *Deleted

## 2021-06-07 DIAGNOSIS — J309 Allergic rhinitis, unspecified: Secondary | ICD-10-CM | POA: Diagnosis not present

## 2021-06-12 ENCOUNTER — Ambulatory Visit (INDEPENDENT_AMBULATORY_CARE_PROVIDER_SITE_OTHER): Payer: PPO | Admitting: *Deleted

## 2021-06-12 DIAGNOSIS — J309 Allergic rhinitis, unspecified: Secondary | ICD-10-CM

## 2021-06-21 ENCOUNTER — Ambulatory Visit (INDEPENDENT_AMBULATORY_CARE_PROVIDER_SITE_OTHER): Payer: PPO | Admitting: *Deleted

## 2021-06-21 DIAGNOSIS — J309 Allergic rhinitis, unspecified: Secondary | ICD-10-CM | POA: Diagnosis not present

## 2021-06-28 ENCOUNTER — Ambulatory Visit (INDEPENDENT_AMBULATORY_CARE_PROVIDER_SITE_OTHER): Payer: PPO | Admitting: *Deleted

## 2021-06-28 DIAGNOSIS — J309 Allergic rhinitis, unspecified: Secondary | ICD-10-CM

## 2021-07-23 ENCOUNTER — Ambulatory Visit (INDEPENDENT_AMBULATORY_CARE_PROVIDER_SITE_OTHER): Payer: PPO

## 2021-07-23 DIAGNOSIS — J309 Allergic rhinitis, unspecified: Secondary | ICD-10-CM

## 2021-07-31 DIAGNOSIS — M25511 Pain in right shoulder: Secondary | ICD-10-CM | POA: Diagnosis not present

## 2021-08-02 ENCOUNTER — Ambulatory Visit (INDEPENDENT_AMBULATORY_CARE_PROVIDER_SITE_OTHER): Payer: PPO

## 2021-08-02 ENCOUNTER — Ambulatory Visit: Payer: PPO | Admitting: Podiatry

## 2021-08-02 ENCOUNTER — Encounter: Payer: Self-pay | Admitting: Podiatry

## 2021-08-02 ENCOUNTER — Other Ambulatory Visit: Payer: Self-pay

## 2021-08-02 DIAGNOSIS — M79672 Pain in left foot: Secondary | ICD-10-CM | POA: Diagnosis not present

## 2021-08-02 DIAGNOSIS — M722 Plantar fascial fibromatosis: Secondary | ICD-10-CM

## 2021-08-02 MED ORDER — TRIAMCINOLONE ACETONIDE 10 MG/ML IJ SUSP
10.0000 mg | Freq: Once | INTRAMUSCULAR | Status: AC
Start: 2021-08-02 — End: 2021-08-02
  Administered 2021-08-02: 10 mg

## 2021-08-03 ENCOUNTER — Other Ambulatory Visit: Payer: Self-pay | Admitting: Podiatry

## 2021-08-03 DIAGNOSIS — M722 Plantar fascial fibromatosis: Secondary | ICD-10-CM

## 2021-08-05 NOTE — Progress Notes (Signed)
Subjective:   Patient ID: Carrie Cox, female   DOB: 75 y.o.   MRN: [id number]   HPI Patient presents stating she has had a reoccurrence of pain in the bottom of her left heel and its making it hard for her to walk.  States its been present for about 6 months and she is tried to deal with it over that period of time.  Patient does not smoke and would like to be more active   Review of Systems  All other systems reviewed and are negative.      Objective:  Physical Exam Vitals and nursing note reviewed.  Constitutional:      Appearance: She is well-developed.  Pulmonary:     Effort: Pulmonary effort is normal.  Musculoskeletal:        General: Normal range of motion.  Skin:    General: Skin is warm.  Neurological:     Mental Status: She is alert.    Neurovascular status intact muscle strength found to be adequate range of motion within normal limits.  Patient is noted to have exquisite discomfort in the plantar aspect of the left heel at the insertional point of the tendon into the calcaneus with inflammation fluid around the medial band.  Patient has good digital perfusion well oriented x3 acute     Assessment:  acute fasciitis left with inflammation fluid around the medial band     Plan:  H&P x-ray reviewed condition discussed.  At this point sterile prep injected the fascia 3 mg Kenalog 5 mg Xylocaine advised on support shoes reappoint as needed for treatment  X-rays indicate small spur no indication stress fracture arthritis associated with pain

## 2021-08-13 DIAGNOSIS — M25511 Pain in right shoulder: Secondary | ICD-10-CM | POA: Diagnosis not present

## 2021-08-20 DIAGNOSIS — M25511 Pain in right shoulder: Secondary | ICD-10-CM | POA: Diagnosis not present

## 2021-08-23 ENCOUNTER — Ambulatory Visit (INDEPENDENT_AMBULATORY_CARE_PROVIDER_SITE_OTHER): Payer: PPO | Admitting: *Deleted

## 2021-08-23 DIAGNOSIS — J309 Allergic rhinitis, unspecified: Secondary | ICD-10-CM

## 2021-08-30 DIAGNOSIS — M25511 Pain in right shoulder: Secondary | ICD-10-CM | POA: Diagnosis not present

## 2021-09-05 NOTE — Progress Notes (Signed)
Subjective:     Patient ID: Carrie Cox, female   DOB: Dec 13, 1945, 75 y.o.   MRN: [id number]  HPI F never smoker followed  for chronic rhinitis and asthma.  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 Denies heart or other systemic concerns. Shows large bruise L shin where hit by branch she was cutting.   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 Doing Tai Chi 3x/ week. No asthma exacerbation, very rare use of rescue inhaler. Has never used epipen- yellow jacket sting years ago was associated with large local reaction and chest tightness= systemic reaction.  ROS-see HPI    + = positive Constitutional:   No-   weight loss, night sweats, fevers, chills, fatigue, lassitude. HEENT:   No-  headaches, difficulty swallowing, tooth/dental problems, sore throat,       No-  sneezing, itching, ear ache, nasal congestion, post nasal drip,  CV:  No-   chest pain, orthopnea, PND, swelling in lower extremities, anasarca,                                                         dizziness, palpitations Resp: No-   shortness of breath with exertion or at rest.              No-   productive cough,  No non-productive cough,  No- coughing  up of blood.              No-   change in color of mucus.  No- wheezing.   Skin: No-   rash or lesions. GI:  No-   heartburn, indigestion, abdominal pain, nausea, vomiting,  GU:  MS:  No-   joint pain or swelling.  Neuro-     nothing unusual Psych:  No- change in mood or affect. No depression or anxiety.  No memory loss.  OBJ- Physical Exam General- Alert, Oriented, Affect-appropriate, Distress- none acute Skin- rash-none, lesions- none, excoriation- none Lymphadenopathy- none Head- atraumatic            Eyes- Gross vision intact, PERRLA, conjunctivae and secretions clear            Ears- Hearing, canals-normal            Nose- Clear, no-Septal dev, mucus, polyps, erosion, perforation             Throat- Mallampati II , mucosa clear , drainage- none, tonsils- atrophic Neck- flexible , trachea midline, no stridor , thyroid nl, carotid no bruit Chest - symmetrical excursion , unlabored           Heart/CV- RRR , no murmur , no gallop  , no rub, nl s1 s2                           -  JVD- none , edema- none, stasis changes- none, varices- none           Lung- clear to P&A, wheeze- none, cough- none , dullness-none, rub- none           Chest wall-  Abd-  Br/ Gen/ Rectal- Not done, not indicated Extrem- cyanosis- none, clubbing, none, atrophy- none, strength- nl, Neuro- grossly intact to observation

## 2021-09-06 ENCOUNTER — Other Ambulatory Visit: Payer: Self-pay

## 2021-09-06 ENCOUNTER — Encounter: Payer: Self-pay | Admitting: Internal Medicine

## 2021-09-06 ENCOUNTER — Ambulatory Visit: Payer: PPO | Admitting: Internal Medicine

## 2021-09-06 DIAGNOSIS — J4520 Mild intermittent asthma, uncomplicated: Secondary | ICD-10-CM | POA: Diagnosis not present

## 2021-09-06 DIAGNOSIS — Z91038 Other insect allergy status: Secondary | ICD-10-CM

## 2021-09-06 MED ORDER — MONTELUKAST SODIUM 10 MG PO TABS: 10.0000 mg | ORAL_TABLET | Freq: Every day | ORAL | 3 refills | Status: DC

## 2021-09-06 MED ORDER — LEVALBUTEROL TARTRATE 45 MCG/ACT IN AERO: 2.0000 | INHALATION_SPRAY | Freq: Four times a day (QID) | RESPIRATORY_TRACT | 12 refills | Status: DC | PRN

## 2021-09-06 NOTE — Patient Instructions (Signed)
Glad you are doing well. Please call if we can help.  We left epipen on your med list for now.  Xopenex rescue inhaler and singulair refilled

## 2021-09-06 NOTE — Assessment & Plan Note (Signed)
Uncomplicated, doing very well Plan- refilled singulair and xopenex hfa

## 2021-09-06 NOTE — Assessment & Plan Note (Signed)
Systemic reaction by description to sting many years ago. We will leave epipen on list  Suggest she keep benadryl close for first aid.

## 2021-09-13 DIAGNOSIS — M25511 Pain in right shoulder: Secondary | ICD-10-CM | POA: Diagnosis not present

## 2021-09-17 ENCOUNTER — Other Ambulatory Visit: Payer: Self-pay | Admitting: Internal Medicine

## 2021-09-27 ENCOUNTER — Ambulatory Visit: Payer: Self-pay | Admitting: Obstetrics & Gynecology

## 2021-10-02 ENCOUNTER — Ambulatory Visit (INDEPENDENT_AMBULATORY_CARE_PROVIDER_SITE_OTHER): Payer: PPO

## 2021-10-02 DIAGNOSIS — J309 Allergic rhinitis, unspecified: Secondary | ICD-10-CM | POA: Diagnosis not present

## 2021-10-10 ENCOUNTER — Ambulatory Visit (INDEPENDENT_AMBULATORY_CARE_PROVIDER_SITE_OTHER): Payer: PPO

## 2021-10-10 DIAGNOSIS — J309 Allergic rhinitis, unspecified: Secondary | ICD-10-CM

## 2021-10-18 DIAGNOSIS — Z79899 Other long term (current) drug therapy: Secondary | ICD-10-CM | POA: Diagnosis not present

## 2021-10-18 DIAGNOSIS — E559 Vitamin D deficiency, unspecified: Secondary | ICD-10-CM | POA: Diagnosis not present

## 2021-10-18 DIAGNOSIS — R7303 Prediabetes: Secondary | ICD-10-CM | POA: Diagnosis not present

## 2021-10-18 DIAGNOSIS — E039 Hypothyroidism, unspecified: Secondary | ICD-10-CM | POA: Diagnosis not present

## 2021-10-18 DIAGNOSIS — E7800 Pure hypercholesterolemia, unspecified: Secondary | ICD-10-CM | POA: Diagnosis not present

## 2021-10-18 DIAGNOSIS — E538 Deficiency of other specified B group vitamins: Secondary | ICD-10-CM | POA: Diagnosis not present

## 2021-10-23 DIAGNOSIS — E7800 Pure hypercholesterolemia, unspecified: Secondary | ICD-10-CM | POA: Diagnosis not present

## 2021-10-23 DIAGNOSIS — E039 Hypothyroidism, unspecified: Secondary | ICD-10-CM | POA: Diagnosis not present

## 2021-10-23 DIAGNOSIS — R030 Elevated blood-pressure reading, without diagnosis of hypertension: Secondary | ICD-10-CM | POA: Diagnosis not present

## 2021-10-23 DIAGNOSIS — Z0000 Encounter for general adult medical examination without abnormal findings: Secondary | ICD-10-CM | POA: Diagnosis not present

## 2021-10-23 DIAGNOSIS — Z79899 Other long term (current) drug therapy: Secondary | ICD-10-CM | POA: Diagnosis not present

## 2021-10-23 DIAGNOSIS — R7303 Prediabetes: Secondary | ICD-10-CM | POA: Diagnosis not present

## 2021-10-23 DIAGNOSIS — E538 Deficiency of other specified B group vitamins: Secondary | ICD-10-CM | POA: Diagnosis not present

## 2021-10-23 DIAGNOSIS — F5104 Psychophysiologic insomnia: Secondary | ICD-10-CM | POA: Diagnosis not present

## 2021-10-23 DIAGNOSIS — M8589 Other specified disorders of bone density and structure, multiple sites: Secondary | ICD-10-CM | POA: Diagnosis not present

## 2021-10-23 DIAGNOSIS — M62838 Other muscle spasm: Secondary | ICD-10-CM | POA: Diagnosis not present

## 2021-10-30 DIAGNOSIS — M25511 Pain in right shoulder: Secondary | ICD-10-CM | POA: Diagnosis not present

## 2021-11-07 ENCOUNTER — Ambulatory Visit (INDEPENDENT_AMBULATORY_CARE_PROVIDER_SITE_OTHER): Payer: PPO

## 2021-11-07 DIAGNOSIS — J309 Allergic rhinitis, unspecified: Secondary | ICD-10-CM | POA: Diagnosis not present

## 2021-11-12 ENCOUNTER — Other Ambulatory Visit (HOSPITAL_COMMUNITY)
Admission: RE | Admit: 2021-11-12 | Discharge: 2021-11-12 | Disposition: A | Discharge status: Home / Self Care | Payer: PPO | Source: Ambulatory Visit | Attending: Obstetrics & Gynecology | Admitting: Obstetrics & Gynecology

## 2021-11-12 ENCOUNTER — Other Ambulatory Visit: Payer: Self-pay

## 2021-11-12 ENCOUNTER — Ambulatory Visit (INDEPENDENT_AMBULATORY_CARE_PROVIDER_SITE_OTHER): Payer: PPO | Admitting: Obstetrics & Gynecology

## 2021-11-12 VITALS — BP 144/82 | HR 88 | Ht 63.0 in | Wt 136.0 lb

## 2021-11-12 DIAGNOSIS — N952 Postmenopausal atrophic vaginitis: Secondary | ICD-10-CM

## 2021-11-12 DIAGNOSIS — B3731 Acute candidiasis of vulva and vagina: Secondary | ICD-10-CM | POA: Diagnosis not present

## 2021-11-12 DIAGNOSIS — B009 Herpesviral infection, unspecified: Secondary | ICD-10-CM

## 2021-11-12 DIAGNOSIS — Z01419 Encounter for gynecological examination (general) (routine) without abnormal findings: Secondary | ICD-10-CM

## 2021-11-12 DIAGNOSIS — Z9189 Other specified personal risk factors, not elsewhere classified: Secondary | ICD-10-CM | POA: Diagnosis not present

## 2021-11-12 DIAGNOSIS — M8580 Other specified disorders of bone density and structure, unspecified site: Secondary | ICD-10-CM | POA: Diagnosis not present

## 2021-11-12 DIAGNOSIS — R87610 Atypical squamous cells of undetermined significance on cytologic smear of cervix (ASC-US): Secondary | ICD-10-CM | POA: Diagnosis not present

## 2021-11-12 MED ORDER — ESTRADIOL 10 MCG VA TABS: 1.0000 | ORAL_TABLET | VAGINAL | 4 refills | Status: DC

## 2021-11-12 MED ORDER — VALACYCLOVIR HCL 1 G PO TABS: 1000.0000 mg | ORAL_TABLET | Freq: Every day | ORAL | 3 refills | Status: AC

## 2021-11-12 MED ORDER — FLUCONAZOLE 150 MG PO TABS: 150.0000 mg | ORAL_TABLET | Freq: Every day | ORAL | 2 refills | Status: AC

## 2021-11-12 NOTE — Progress Notes (Signed)
Carrie Cox [birth year] [id number]   History:    76 y.o. G1P1L1 Married   RP:  Established patient presenting for annual gyn exam    HPI: Postmenopause well on no systemic HRT.  No PMB.  No pelvic pain.  Sexually active.  Pap Neg 08/2020.  Using Vagifem twice a week and Replens daily.  Breast normal.  Mammo Neg 04/2021. Urine and bowel movements normal. Uses valacyclovir for occasional HSV recurrences.  Body mass index 24.09.  Physically active.  Healthy nutrition.  Health labs with family physician. Colonoscopy 2018. Dexa 10/2020 Osteopenia T-Score -1.8 at Lt Fem Neck.    Past medical history,surgical history, family history and social history were all reviewed and documented in the EPIC chart.  Gynecologic History No LMP recorded. Patient is postmenopausal.  Obstetric History OB History  Gravida Para Term Preterm AB Living  1 1 1     1   SAB IAB Ectopic Multiple Live Births               # Outcome Date GA Lbr Len/2nd Weight Sex Delivery Anes PTL Lv  1 Term              ROS: A ROS was performed and pertinent positives and negatives are included in the history.  GENERAL: No fevers or chills. HEENT: No change in vision, no earache, sore throat or sinus congestion. NECK: No pain or stiffness. CARDIOVASCULAR: No chest pain or pressure. No palpitations. PULMONARY: No shortness of breath, cough or wheeze. GASTROINTESTINAL: No abdominal pain, nausea, vomiting or diarrhea, melena or bright red blood per rectum. GENITOURINARY: No urinary frequency, urgency, hesitancy or dysuria. MUSCULOSKELETAL: No joint or muscle pain, no back pain, no recent trauma. DERMATOLOGIC: No rash, no itching, no lesions. ENDOCRINE: No polyuria, polydipsia, no heat or cold intolerance. No recent change in weight. HEMATOLOGICAL: No anemia or easy bruising or bleeding. NEUROLOGIC: No headache, seizures, numbness, tingling or weakness. PSYCHIATRIC: No depression, no loss of interest in normal activity or change in sleep  pattern.     Exam:   11/12/2021 3:37 PM 11/12/2021 3:57 PM  BP 146/84 144/82  BP Location  Right Arm  Patient Position  Sitting  Cuff Size  Normal  Pulse  88  Weight 136 lb (61.7 kg)   Height 5\' 3"  (1.6 m)    Other Vitals  BMI 24.09 kg/m2  BSA 1.66 m2     General appearance : Well developed well nourished female. No acute distress HEENT: Eyes: no retinal hemorrhage or exudates,  Neck supple, trachea midline, no carotid bruits, no thyroidmegaly Lungs: Clear to auscultation, no rhonchi or wheezes, or rib retractions  Heart: Regular rate and rhythm, no murmurs or gallops Breast:Examined in sitting and supine position were symmetrical in appearance, no palpable masses or tenderness,  no skin retraction, no nipple inversion, no nipple discharge, no skin discoloration, no axillary or supraclavicular lymphadenopathy Abdomen: no palpable masses or tenderness, no rebound or guarding Extremities: no edema or skin discoloration or tenderness  Pelvic: Vulva: Normal             Vagina: No gross lesions or discharge  Cervix: No gross lesions or discharge.  Pap reflex done.  Uterus  AV, normal size, shape and consistency, non-tender and mobile  Adnexa  Without masses or tenderness  Anus: Normal   Assessment/Plan:  76 y.o. female for annual exam   1. Encounter for routine gynecological examination with Papanicolaou smear of cervix Postmenopause well on no systemic HRT.  No PMB.  No pelvic pain.  Sexually active.  Pap Neg 08/2020.  Pap Reflex done today.  Using Vagifem twice a week and Replens daily.  Breast normal.  Mammo Neg 04/2021. Urine and bowel movements normal. Uses valacyclovir for occasional HSV recurrences.  Body mass index 24.09.  Physically active.  Healthy nutrition.  Health labs with family physician. Colonoscopy 2018. Dexa 10/2020 Osteopenia T-Score -1.8 at Lt Fem Neck. - Cytology - PAP( [facility])  2. At risk for infection  3. ASCUS of cervix with negative high risk HPV  4.  Postmenopausal atrophic vaginitis Postmenopause well on no systemic HRT.  No PMB.  No pelvic pain.  Sexually active.  Vagifem prescription sent to pharmacy.  5. Osteopenia, unspecified location  Dexa 10/2020 Osteopenia T-Score -1.8 at Lt Fem Neck. Vit D supplement, Ca++ 1.5 g/d total, regular wt bearing fitness.  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  7. Yeast vaginitis Fluconazole PRN.  Other orders - Estradiol (YUVAFEM) 10 MCG TABS vaginal tablet; Place 1 tablet (10 mcg total) vaginally 2 (two) times a week. - fluconazole (DIFLUCAN) 150 MG tablet; Take 1 tablet (150 mg total) by mouth daily for 3 days. - valACYclovir (VALTREX) 1000 MG tablet; Take 1 tablet (1,000 mg total) by mouth daily for 5 days.    Princess Bruins MD, 3:35 PM 11/12/2021

## 2021-11-13 ENCOUNTER — Encounter: Payer: Self-pay | Admitting: Obstetrics & Gynecology

## 2021-11-13 ENCOUNTER — Telehealth: Payer: Self-pay | Admitting: *Deleted

## 2021-11-13 LAB — CYTOLOGY - PAP: Diagnosis: NEGATIVE

## 2021-11-13 NOTE — Telephone Encounter (Signed)
Called and spoke with patient and received consent to make her next set of allergy vials.

## 2021-11-14 DIAGNOSIS — J3081 Allergic rhinitis due to animal (cat) (dog) hair and dander: Secondary | ICD-10-CM | POA: Diagnosis not present

## 2021-11-14 NOTE — Progress Notes (Signed)
VIALS MADE. EXP 11-14-22

## 2021-11-15 DIAGNOSIS — J3089 Other allergic rhinitis: Secondary | ICD-10-CM | POA: Diagnosis not present

## 2021-11-23 ENCOUNTER — Other Ambulatory Visit: Payer: Self-pay | Admitting: Obstetrics & Gynecology

## 2021-12-06 ENCOUNTER — Ambulatory Visit (INDEPENDENT_AMBULATORY_CARE_PROVIDER_SITE_OTHER): Payer: PPO

## 2021-12-06 DIAGNOSIS — J309 Allergic rhinitis, unspecified: Secondary | ICD-10-CM | POA: Diagnosis not present

## 2022-01-01 ENCOUNTER — Ambulatory Visit: Payer: PPO | Admitting: Radiology

## 2022-01-01 ENCOUNTER — Encounter: Payer: Self-pay | Admitting: Radiology

## 2022-01-01 ENCOUNTER — Other Ambulatory Visit: Payer: Self-pay

## 2022-01-01 VITALS — BP 138/82 | Temp 98.9°F | Ht 63.5 in | Wt 132.0 lb

## 2022-01-01 DIAGNOSIS — N3000 Acute cystitis without hematuria: Secondary | ICD-10-CM | POA: Diagnosis not present

## 2022-01-01 DIAGNOSIS — N3090 Cystitis, unspecified without hematuria: Secondary | ICD-10-CM | POA: Diagnosis not present

## 2022-01-01 MED ORDER — NITROFURANTOIN MONOHYD MACRO 100 MG PO CAPS: 100.0000 mg | ORAL_CAPSULE | Freq: Two times a day (BID) | ORAL | 0 refills | Status: DC

## 2022-01-01 NOTE — Progress Notes (Signed)
? ? ? ? ?  SUBJECTIVE: Carrie Cox is a 76 y.o. female who complains of urinary frequency and dysuria x 1 days, without flank pain, fever, chills, or abnormal vaginal discharge or bleeding.  ?Symptoms began after she had a bout of diarrhea for 2 days. ? ?OBJECTIVE: Appears well, in no apparent distress.  Vital signs are normal. The abdomen is soft without tenderness, guarding, mass, rebound or organomegaly. No CVA tenderness or inguinal adenopathy noted. Urine dipstick shows positive for RBC's, positive for protein, and positive for leukocytes.  Micro exam: 60+ WBC's per HPF, 3-10 RBC's per HPF, and moderate + bacteria.  ? ?Chaperone offered and declined for exam. ? ?ASSESSMENT/PLAN:  ? ?1. Cystitis ? ?- Urinalysis,Complete w/RFL Culture ?- nitrofurantoin, macrocrystal-monohydrate, (MACROBID) 100 MG capsule; Take 1 capsule (100 mg total) by mouth 2 (two) times daily.  Dispense: 14 capsule; Refill: 0  ? ?Treatment per orders - also push fluids, avoid bladder irritants. Instructed she may use Pyridium OTC prn. Call or return to clinic prn if these symptoms worsen or fail to improve as anticipated. Pyelo precautions reviewed with patient.  ?  ?

## 2022-01-03 ENCOUNTER — Ambulatory Visit (INDEPENDENT_AMBULATORY_CARE_PROVIDER_SITE_OTHER): Payer: PPO | Admitting: *Deleted

## 2022-01-03 ENCOUNTER — Other Ambulatory Visit: Payer: Self-pay

## 2022-01-03 ENCOUNTER — Encounter: Payer: Self-pay | Admitting: Radiology

## 2022-01-03 ENCOUNTER — Ambulatory Visit (INDEPENDENT_AMBULATORY_CARE_PROVIDER_SITE_OTHER): Payer: PPO | Admitting: Radiology

## 2022-01-03 VITALS — BP 122/76

## 2022-01-03 DIAGNOSIS — R39198 Other difficulties with micturition: Secondary | ICD-10-CM | POA: Diagnosis not present

## 2022-01-03 DIAGNOSIS — J309 Allergic rhinitis, unspecified: Secondary | ICD-10-CM | POA: Diagnosis not present

## 2022-01-03 LAB — URINALYSIS, COMPLETE W/RFL CULTURE
Bilirubin Urine: NEGATIVE
Glucose, UA: NEGATIVE
Hyaline Cast: NONE SEEN /LPF
Ketones, ur: NEGATIVE
Nitrites, Initial: NEGATIVE
Specific Gravity, Urine: 1.015 (ref 1.001–1.035)
WBC, UA: >=60 /HPF — AB (ref 0–5)
pH: 5.5 (ref 5.0–8.0)

## 2022-01-03 LAB — URINE CULTURE
MICRO NUMBER:: 13097894
SPECIMEN QUALITY:: ADEQUATE

## 2022-01-03 LAB — CULTURE INDICATED

## 2022-01-03 NOTE — Progress Notes (Signed)
? ? ? ? ?  SUBJECTIVE: Carrie Cox is a 76 y.o. female Complains of trouble voiding.  Woke up this morning and was unable to void after drinking lots of fluids (has emptied her bladder since, no dysuria, no frequency, no urgency, no fever).  Did not take macrobid this morning (was afraid she was having a reaction to the antibiotic). ? ?OBJECTIVE: Appears well, in no apparent distress.  Vital signs are normal. The abdomen is soft without tenderness, guarding, mass, rebound or organomegaly. No CVA tenderness or inguinal adenopathy noted. Urine dipstick shows positive for RBC's, positive for protein, and positive for leukocytes.   ? ?Chaperone offered and declined for exam. ? ?ASSESSMENT/PLAN:  ?1. Voiding difficulty ?Reassured urine culture showed mixed genital flora. May stop antibiotics. ?Continue vagifem, add vit E suppositories as well. ?Explained the difficulty initiating stream was likely due to swelling from taking a break from atrophy medications. Pt understand and agrees to knew plan ?

## 2022-01-10 ENCOUNTER — Ambulatory Visit (INDEPENDENT_AMBULATORY_CARE_PROVIDER_SITE_OTHER): Payer: PPO

## 2022-01-10 DIAGNOSIS — J309 Allergic rhinitis, unspecified: Secondary | ICD-10-CM | POA: Diagnosis not present

## 2022-01-17 ENCOUNTER — Ambulatory Visit (INDEPENDENT_AMBULATORY_CARE_PROVIDER_SITE_OTHER): Payer: PPO

## 2022-01-17 DIAGNOSIS — J309 Allergic rhinitis, unspecified: Secondary | ICD-10-CM

## 2022-01-22 DIAGNOSIS — H524 Presbyopia: Secondary | ICD-10-CM | POA: Diagnosis not present

## 2022-01-22 DIAGNOSIS — Z961 Presence of intraocular lens: Secondary | ICD-10-CM | POA: Diagnosis not present

## 2022-01-24 ENCOUNTER — Ambulatory Visit (INDEPENDENT_AMBULATORY_CARE_PROVIDER_SITE_OTHER): Payer: PPO

## 2022-01-24 DIAGNOSIS — J309 Allergic rhinitis, unspecified: Secondary | ICD-10-CM

## 2022-01-31 ENCOUNTER — Ambulatory Visit (INDEPENDENT_AMBULATORY_CARE_PROVIDER_SITE_OTHER): Payer: PPO

## 2022-01-31 DIAGNOSIS — J309 Allergic rhinitis, unspecified: Secondary | ICD-10-CM

## 2022-03-01 ENCOUNTER — Ambulatory Visit (INDEPENDENT_AMBULATORY_CARE_PROVIDER_SITE_OTHER): Payer: PPO

## 2022-03-01 DIAGNOSIS — J309 Allergic rhinitis, unspecified: Secondary | ICD-10-CM

## 2022-03-29 ENCOUNTER — Ambulatory Visit (INDEPENDENT_AMBULATORY_CARE_PROVIDER_SITE_OTHER): Payer: PPO | Admitting: *Deleted

## 2022-03-29 DIAGNOSIS — J309 Allergic rhinitis, unspecified: Secondary | ICD-10-CM | POA: Diagnosis not present

## 2022-04-24 DIAGNOSIS — E039 Hypothyroidism, unspecified: Secondary | ICD-10-CM | POA: Diagnosis not present

## 2022-04-24 DIAGNOSIS — R7303 Prediabetes: Secondary | ICD-10-CM | POA: Diagnosis not present

## 2022-04-24 DIAGNOSIS — Z79899 Other long term (current) drug therapy: Secondary | ICD-10-CM | POA: Diagnosis not present

## 2022-04-25 ENCOUNTER — Ambulatory Visit (INDEPENDENT_AMBULATORY_CARE_PROVIDER_SITE_OTHER): Payer: PPO

## 2022-04-25 DIAGNOSIS — J309 Allergic rhinitis, unspecified: Secondary | ICD-10-CM | POA: Diagnosis not present

## 2022-05-09 DIAGNOSIS — J3081 Allergic rhinitis due to animal (cat) (dog) hair and dander: Secondary | ICD-10-CM | POA: Diagnosis not present

## 2022-05-09 NOTE — Progress Notes (Signed)
VIALS EXP 05-10-23

## 2022-05-10 DIAGNOSIS — J3089 Other allergic rhinitis: Secondary | ICD-10-CM | POA: Diagnosis not present

## 2022-05-17 ENCOUNTER — Ambulatory Visit (INDEPENDENT_AMBULATORY_CARE_PROVIDER_SITE_OTHER): Payer: PPO

## 2022-05-17 DIAGNOSIS — J309 Allergic rhinitis, unspecified: Secondary | ICD-10-CM | POA: Diagnosis not present

## 2022-05-21 ENCOUNTER — Encounter: Payer: Self-pay | Admitting: Allergy and Immunology

## 2022-05-21 ENCOUNTER — Ambulatory Visit: Payer: PPO | Admitting: Allergy and Immunology

## 2022-05-21 VITALS — BP 138/82 | HR 78 | Temp 97.8°F | Ht 63.5 in | Wt 123.0 lb

## 2022-05-21 DIAGNOSIS — J3089 Other allergic rhinitis: Secondary | ICD-10-CM

## 2022-05-21 DIAGNOSIS — H01119 Allergic dermatitis of unspecified eye, unspecified eyelid: Secondary | ICD-10-CM

## 2022-05-21 DIAGNOSIS — E7800 Pure hypercholesterolemia, unspecified: Secondary | ICD-10-CM | POA: Insufficient documentation

## 2022-05-21 DIAGNOSIS — F329 Major depressive disorder, single episode, unspecified: Secondary | ICD-10-CM | POA: Insufficient documentation

## 2022-05-21 DIAGNOSIS — Z860100 Personal history of colon polyps, unspecified: Secondary | ICD-10-CM | POA: Insufficient documentation

## 2022-05-21 DIAGNOSIS — J4520 Mild intermittent asthma, uncomplicated: Secondary | ICD-10-CM

## 2022-05-21 DIAGNOSIS — F40243 Fear of flying: Secondary | ICD-10-CM | POA: Insufficient documentation

## 2022-05-21 DIAGNOSIS — E039 Hypothyroidism, unspecified: Secondary | ICD-10-CM | POA: Insufficient documentation

## 2022-05-21 DIAGNOSIS — E559 Vitamin D deficiency, unspecified: Secondary | ICD-10-CM | POA: Insufficient documentation

## 2022-05-21 DIAGNOSIS — R7303 Prediabetes: Secondary | ICD-10-CM | POA: Insufficient documentation

## 2022-05-21 DIAGNOSIS — T781XXD Other adverse food reactions, not elsewhere classified, subsequent encounter: Secondary | ICD-10-CM

## 2022-05-21 DIAGNOSIS — Z91038 Other insect allergy status: Secondary | ICD-10-CM

## 2022-05-21 DIAGNOSIS — F5104 Psychophysiologic insomnia: Secondary | ICD-10-CM | POA: Insufficient documentation

## 2022-05-21 MED ORDER — LEVALBUTEROL TARTRATE 45 MCG/ACT IN AERO: 2.0000 | INHALATION_SPRAY | Freq: Four times a day (QID) | RESPIRATORY_TRACT | 1 refills | Status: DC | PRN

## 2022-05-21 MED ORDER — MONTELUKAST SODIUM 10 MG PO TABS: 10.0000 mg | ORAL_TABLET | Freq: Every day | ORAL | 4 refills | Status: DC

## 2022-05-21 MED ORDER — EPINEPHRINE 0.3 MG/0.3ML IJ SOAJ: 0.3000 mg | INTRAMUSCULAR | 1 refills | Status: DC | PRN

## 2022-05-21 MED ORDER — FEXOFENADINE HCL 180 MG PO TABS: 180.0000 mg | ORAL_TABLET | Freq: Every day | ORAL | 5 refills | Status: DC | PRN

## 2022-05-21 MED ORDER — FLUTICASONE PROPIONATE 50 MCG/ACT NA SUSP: 2.0000 | Freq: Every day | NASAL | 4 refills | Status: DC

## 2022-05-21 MED ORDER — AZELASTINE HCL 0.1 % NA SOLN: 1.0000 | Freq: Two times a day (BID) | NASAL | 4 refills | Status: DC

## 2022-05-21 NOTE — Patient Instructions (Signed)
  1. Continue Treat and prevent inflammation of airway:   A. montelukast 10 mg tablet 1 time per day  B. Flonase 1-2 sprays each nostril one time per day  C. Immunotherapy  2. If needed:   A. Azelastine 1-2 sprays each nostril 1-2 times per day    B. OTC antihistamine - Allegra  C. nasal saline spray  D. EpiPen, Benadryl, M.D./ER evaluation for allergic reaction  E. Xopenex (levalbuterol) 2 puffs every 4-6 hours     F. Pataday one drop each eye one time per day  G.Elidel ointment + Desonide ointment 2 times a day during eye lid flare up  3. Return to clinic in 1 year or earlier if problem  4. Obtain fall flu vaccine and RSV vaccine   5. Visit with dentist about right maxilla issue

## 2022-05-21 NOTE — Progress Notes (Unsigned)
[facility]   Follow-up Note  Referring Provider: Glenis Smoker, * Primary Provider: Glenis Smoker, MD Date of Office Visit: 05/21/2022  Subjective:   Carrie Cox (DOB: December 22, 1945) is a 76 y.o. female who returns to the [facility] on 05/21/2022 in re-evaluation of the following:  HPI: Daily returns to this clinic in evaluation of allergic rhinoconjunctivitis, intermittent asthma, shellfish allergy, hymenoptera venom allergy, and history of inflammatory dermatosis.  Her last visit to this clinic was 15 Mar 2021.  Overall she has done very well.  She continues to use a course of immunotherapy.  She has been able to go through each season of the year with very little issues involving either her nose or eyes.  She continues to use montelukast and Flonase on a pretty consistent basis.  Currently her immunotherapy is every 4 weeks.  She has had very little problems with her eyelid inflammation as she has basically eliminated any old make-up.  She cycles out her make-up every 3 months and this is resulted in very good control of this issue and she has not had to use any topical agents for her eyelids.  She remains away from shellfish consumption.  She does have an EpiPen regarding her hymenoptera venom hypersensitivity state.  She has been having a issue affecting her right maxilla.  She states that there is a ache in her ear and in her right maxilla that has been there for a few weeks.  She does not have any dizziness or hearing loss or tinnitus or any other significant respiratory or cephalgia issue.  Allergies as of 05/21/2022       Reactions   Bee Venom Anaphylaxis   Allergic to yellow jackets   Nsaids Nausea Only   Terrible pains in stomach Other reaction(s): abdominal pain   Shellfish Allergy Anaphylaxis   Aloe    Red rash (sunburn type)   Codeine Nausea And Vomiting   Diphenhydramine    Other  reaction(s): paradoxical reaction, restlessness   Doxycycline    Stomach pain   Erythromycin    Stomach pain   Other    Pain meds in general --Nausea and vomiting Artificial Sweetners- vomiting and swelling Anti-inflammatories - "can't take oral anti-inflammatories it hurts my stomach - Nausea and stabbing stomach pain" [place]-Severe Rash/hives Other reaction(s): N/V, swelling   Soy Allergy Hives   Soybean (diagnostic)    Other reaction(s): Vomiting, Rash,        Medication List    ALPRAZolam 0.25 MG tablet Commonly known as: XANAX Take 0.25 mg by mouth at bedtime as needed for anxiety.   azelastine 0.1 % nasal spray Commonly known as: ASTELIN Place 1 spray into both nostrils 2 (two) times daily.   BENADRYL PO Take by mouth as needed.   EPINEPHrine 0.3 mg/0.3 mL Soaj injection Commonly known as: EPI-PEN INJECT 0.3 MLS (0.3 MG TOTAL) INTO THE MUSCLE ONCE FOR 1 DOSE.   escitalopram 10 MG tablet Commonly known as: LEXAPRO Take 10 mg by mouth daily.   Estradiol 10 MCG Tabs vaginal tablet Commonly known as: Yuvafem Place 1 tablet (10 mcg total) vaginally 2 (two) times a week.   fluticasone 50 MCG/ACT nasal spray Commonly known as: FLONASE 1-2 puffs each nostril once daily as needed   levalbuterol 45 MCG/ACT inhaler Commonly known as: Xopenex HFA Inhale 2 puffs into the lungs every 6 (six) hours as needed for wheezing.   montelukast 10  MG tablet Commonly known as: SINGULAIR Take 1 tablet (10 mg total) by mouth daily.   mupirocin ointment 2 % Commonly known as: BACTROBAN 1 application 3 times a day for 10 days only   NON FORMULARY [facility]  Anti-Inflammatory Cream- Diclofenac 3%, Baclofen 2%, Lidocaine 2% Apply 1-2 grams to affected area 3-4 times daily Qty. 120 gm 3 refills   VITAMIN B 12 PO Take by mouth.   VITAMIN D PO Take 2,000 Units by mouth daily.    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  Stable from prior DEXA    Past Surgical History:  Procedure Laterality Date   [place] Right 08/2016   FOOT SURGERY     BILATERAL ORTHO   LAPAROSCOPIC APPENDECTOMY N/A 03/04/2013   Procedure: APPENDECTOMY LAPAROSCOPIC;  Surgeon: Shann Medal, MD;  Location: [facility];  Service: General;  Laterality: N/A;   MOUTH SURGERY     TONSILLECTOMY     and adenoids   TONSILLECTOMY  1955    Review of systems negative except as noted in HPI / PMHx or noted below:  Review of Systems  Constitutional: Negative.   HENT: Negative.    Eyes: Negative.   Respiratory: Negative.    Cardiovascular: Negative.   Gastrointestinal: Negative.   Genitourinary: Negative.   Musculoskeletal: Negative.   Skin: Negative.   Neurological: Negative.   Endo/Heme/Allergies: Negative.   Psychiatric/Behavioral: Negative.       Objective:   Vitals:   05/21/22 1033  BP: 138/82  Pulse: 78  Temp: 97.8 F (36.6 C)  SpO2: 99%   Height: 5' 3.5" (161.3 cm)  Weight: 123 lb (55.8 kg)   Physical Exam Constitutional:      Appearance: She is not diaphoretic.  HENT:     Head: Normocephalic.     Right Ear: Tympanic membrane, ear canal and external ear normal.     Left Ear: Tympanic membrane, ear canal and external ear normal.     Nose: Nose normal. No mucosal edema or rhinorrhea.     Mouth/Throat:     Pharynx: Uvula midline. No oropharyngeal exudate.  Eyes:     Conjunctiva/sclera: Conjunctivae normal.  Neck:     Thyroid: No thyromegaly.     Trachea: Trachea normal. No tracheal tenderness or tracheal deviation.  Cardiovascular:     Rate and Rhythm: Normal rate and regular rhythm.     Heart sounds: Normal heart sounds, S1 normal and S2 normal. No  murmur heard. Pulmonary:     Effort: No respiratory distress.     Breath sounds: Normal breath sounds. No stridor. No wheezing or rales.  Lymphadenopathy:     Head:     Right side of head: No tonsillar adenopathy.     Left side of head: No tonsillar adenopathy.     Cervical: No cervical adenopathy.  Skin:    Findings: No erythema or rash.     Nails: There is no clubbing.  Neurological:     Mental Status: She is alert.     Diagnostics: none  Assessment and Plan:   1. Perennial  allergic rhinitis   2. Asthma, mild intermittent, well-controlled   3. Eyelid dermatitis, allergic/contact   4. History of systemic reaction to hymenoptera   5. Adverse food reaction, subsequent encounter    1. Continue Treat and prevent inflammation of airway:   A. montelukast 10 mg tablet 1 time per day  B. Flonase 1-2 sprays each nostril one time per day  C. Immunotherapy  2. If needed:   A. Azelastine 1-2 sprays each nostril 1-2 times per day    B. OTC antihistamine - Allegra  C. nasal saline spray  D. EpiPen, Benadryl, M.D./ER evaluation for allergic reaction  E. Xopenex (levalbuterol) 2 puffs every 4-6 hours     F. Pataday one drop each eye one time per day  G. Elidel ointment + Desonide ointment 2 times a day during eye lid flare up  3. Return to clinic in 1 year or earlier if problem  4. Obtain fall flu vaccine and RSV vaccine   5. Visit with dentist about right maxilla issue  Cheyene is doing pretty well regarding her atopic disease on her current plan which includes immunotherapy and she will continue on this form of treatment and she has a selection of other agents to be utilized should they be required as noted above.  Her right-sided otic and maxilla pain will be evaluated further by her dentist next week.  Assuming she does well with the plan noted above I will see her back in this clinic in 1 year or earlier if there is a problem.  Allena Katz, MD Allergy / Immunology [facility]

## 2022-05-22 ENCOUNTER — Encounter: Payer: Self-pay | Admitting: Allergy and Immunology

## 2022-05-24 DIAGNOSIS — Z1231 Encounter for screening mammogram for malignant neoplasm of breast: Secondary | ICD-10-CM | POA: Diagnosis not present

## 2022-06-17 ENCOUNTER — Ambulatory Visit (INDEPENDENT_AMBULATORY_CARE_PROVIDER_SITE_OTHER): Payer: PPO | Admitting: *Deleted

## 2022-06-17 DIAGNOSIS — S62635B Displaced fracture of distal phalanx of left ring finger, initial encounter for open fracture: Secondary | ICD-10-CM | POA: Diagnosis not present

## 2022-06-17 DIAGNOSIS — S6992XA Unspecified injury of left wrist, hand and finger(s), initial encounter: Secondary | ICD-10-CM | POA: Diagnosis not present

## 2022-06-17 DIAGNOSIS — J309 Allergic rhinitis, unspecified: Secondary | ICD-10-CM | POA: Diagnosis not present

## 2022-07-05 DIAGNOSIS — K649 Unspecified hemorrhoids: Secondary | ICD-10-CM | POA: Diagnosis not present

## 2022-07-05 DIAGNOSIS — K5730 Diverticulosis of large intestine without perforation or abscess without bleeding: Secondary | ICD-10-CM | POA: Diagnosis not present

## 2022-07-05 DIAGNOSIS — Z09 Encounter for follow-up examination after completed treatment for conditions other than malignant neoplasm: Secondary | ICD-10-CM | POA: Diagnosis not present

## 2022-07-05 DIAGNOSIS — Z86010 Personal history of colonic polyps: Secondary | ICD-10-CM | POA: Diagnosis not present

## 2022-07-16 ENCOUNTER — Ambulatory Visit (INDEPENDENT_AMBULATORY_CARE_PROVIDER_SITE_OTHER): Payer: PPO

## 2022-07-16 DIAGNOSIS — J309 Allergic rhinitis, unspecified: Secondary | ICD-10-CM

## 2022-08-13 ENCOUNTER — Ambulatory Visit (INDEPENDENT_AMBULATORY_CARE_PROVIDER_SITE_OTHER): Payer: PPO

## 2022-08-13 DIAGNOSIS — J309 Allergic rhinitis, unspecified: Secondary | ICD-10-CM | POA: Diagnosis not present

## 2022-08-22 ENCOUNTER — Ambulatory Visit (INDEPENDENT_AMBULATORY_CARE_PROVIDER_SITE_OTHER): Payer: PPO

## 2022-08-22 DIAGNOSIS — J309 Allergic rhinitis, unspecified: Secondary | ICD-10-CM | POA: Diagnosis not present

## 2022-08-28 ENCOUNTER — Ambulatory Visit (INDEPENDENT_AMBULATORY_CARE_PROVIDER_SITE_OTHER): Payer: PPO

## 2022-08-28 DIAGNOSIS — J309 Allergic rhinitis, unspecified: Secondary | ICD-10-CM

## 2022-09-04 ENCOUNTER — Ambulatory Visit (INDEPENDENT_AMBULATORY_CARE_PROVIDER_SITE_OTHER): Payer: PPO | Admitting: *Deleted

## 2022-09-04 DIAGNOSIS — J309 Allergic rhinitis, unspecified: Secondary | ICD-10-CM | POA: Diagnosis not present

## 2022-09-05 NOTE — Progress Notes (Unsigned)
Subjective:     Patient ID: Carrie Cox, female   DOB: 1945-10-29, 77 y.o.   MRN: [id number]  HPI F never smoker followed  for chronic rhinitis and asthma.  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 Doing Tai Chi 3x/ week. No asthma exacerbation, very rare use of rescue inhaler. Has never used epipen- yellow jacket sting years ago was associated with large local reaction and chest tightness= systemic reaction.  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 Meds reviewed.  ROS-see HPI    + = positive Constitutional:   No-   weight loss, night sweats, fevers, chills, fatigue, lassitude. HEENT:   No-  headaches, difficulty swallowing, tooth/dental problems, sore throat,       No-  sneezing, itching, ear ache, nasal congestion, post nasal drip,  CV:  No-   chest pain, orthopnea, PND, swelling in lower extremities, anasarca,                                                          dizziness, palpitations Resp: No-   shortness of breath with exertion or at rest.              No-    productive cough,  No non-productive cough,  No- coughing up of blood.              No-   change in color of mucus.  No- wheezing.   Skin: No-   rash or lesions. GI:  No-   heartburn, indigestion, abdominal pain, nausea, vomiting,  GU:  MS:  No-   joint pain or swelling.  Neuro-     nothing unusual Psych:  No- change in mood or affect. No depression or anxiety.  No memory loss.  OBJ- Physical Exam General- Alert, Oriented, Affect-appropriate, Distress- none acute Skin- rash-none, lesions- none, excoriation- none Lymphadenopathy- none Head- atraumatic            Eyes- Gross vision intact, PERRLA, conjunctivae and secretions clear            Ears- Hearing, canals-normal            Nose- Clear, no-Septal dev, mucus, polyps, erosion, perforation             Throat- Mallampati II , mucosa clear , drainage- none, tonsils- atrophic Neck- flexible , trachea midline, no stridor , thyroid nl, carotid no bruit Chest - symmetrical excursion , unlabored           Heart/CV- RRR , no murmur , no gallop  , no rub, nl  s1 s2                           - JVD- none , edema- none, stasis changes- none, varices- none           Lung- clear to P&A, wheeze- none, cough- none , dullness-none, rub- none           Chest wall-  Abd-  Br/ Gen/ Rectal- Not done, not indicated Extrem- cyanosis- none, clubbing, none, atrophy- none, strength- nl, Neuro- grossly intact to observation

## 2022-09-06 ENCOUNTER — Ambulatory Visit: Payer: PPO | Admitting: Internal Medicine

## 2022-09-06 ENCOUNTER — Encounter: Payer: Self-pay | Admitting: Internal Medicine

## 2022-09-06 VITALS — BP 124/70 | HR 80 | Ht 64.0 in | Wt 124.0 lb

## 2022-09-06 DIAGNOSIS — J302 Other seasonal allergic rhinitis: Secondary | ICD-10-CM | POA: Diagnosis not present

## 2022-09-06 DIAGNOSIS — J4520 Mild intermittent asthma, uncomplicated: Secondary | ICD-10-CM | POA: Diagnosis not present

## 2022-09-06 DIAGNOSIS — J3089 Other allergic rhinitis: Secondary | ICD-10-CM

## 2022-09-06 MED ORDER — MONTELUKAST SODIUM 10 MG PO TABS: 10.0000 mg | ORAL_TABLET | Freq: Every day | ORAL | 4 refills | Status: DC

## 2022-09-06 MED ORDER — AZITHROMYCIN 250 MG PO TABS: ORAL_TABLET | ORAL | 0 refills | Status: DC

## 2022-09-06 MED ORDER — AZELASTINE HCL 0.1 % NA SOLN: 1.0000 | Freq: Two times a day (BID) | NASAL | 4 refills | Status: DC

## 2022-09-06 MED ORDER — LEVALBUTEROL TARTRATE 45 MCG/ACT IN AERO: 2.0000 | INHALATION_SPRAY | Freq: Four times a day (QID) | RESPIRATORY_TRACT | 1 refills | Status: DC | PRN

## 2022-09-06 NOTE — Patient Instructions (Signed)
Meds refilled- astelin, Singulair, Xopenex and Zpak  Please call if we can help

## 2022-09-10 ENCOUNTER — Encounter: Payer: Self-pay | Admitting: Internal Medicine

## 2022-09-10 NOTE — Assessment & Plan Note (Signed)
Uncomplicated, well controlled Plan- meds discussed and refilled

## 2022-09-10 NOTE — Assessment & Plan Note (Signed)
Uses Astelin when needed. Continues allergy vaccine from her allergist

## 2022-09-16 ENCOUNTER — Ambulatory Visit (INDEPENDENT_AMBULATORY_CARE_PROVIDER_SITE_OTHER): Payer: PPO | Admitting: *Deleted

## 2022-09-16 DIAGNOSIS — J309 Allergic rhinitis, unspecified: Secondary | ICD-10-CM | POA: Diagnosis not present

## 2022-10-14 ENCOUNTER — Ambulatory Visit (INDEPENDENT_AMBULATORY_CARE_PROVIDER_SITE_OTHER): Payer: PPO

## 2022-10-14 DIAGNOSIS — J309 Allergic rhinitis, unspecified: Secondary | ICD-10-CM

## 2022-10-25 DIAGNOSIS — E559 Vitamin D deficiency, unspecified: Secondary | ICD-10-CM | POA: Diagnosis not present

## 2022-10-25 DIAGNOSIS — E039 Hypothyroidism, unspecified: Secondary | ICD-10-CM | POA: Diagnosis not present

## 2022-10-25 DIAGNOSIS — E538 Deficiency of other specified B group vitamins: Secondary | ICD-10-CM | POA: Diagnosis not present

## 2022-10-25 DIAGNOSIS — E7800 Pure hypercholesterolemia, unspecified: Secondary | ICD-10-CM | POA: Diagnosis not present

## 2022-10-25 DIAGNOSIS — I10 Essential (primary) hypertension: Secondary | ICD-10-CM | POA: Diagnosis not present

## 2022-10-25 DIAGNOSIS — R030 Elevated blood-pressure reading, without diagnosis of hypertension: Secondary | ICD-10-CM | POA: Diagnosis not present

## 2022-10-25 DIAGNOSIS — M62838 Other muscle spasm: Secondary | ICD-10-CM | POA: Diagnosis not present

## 2022-10-25 DIAGNOSIS — F5104 Psychophysiologic insomnia: Secondary | ICD-10-CM | POA: Diagnosis not present

## 2022-10-25 DIAGNOSIS — Z79899 Other long term (current) drug therapy: Secondary | ICD-10-CM | POA: Diagnosis not present

## 2022-10-25 DIAGNOSIS — R7303 Prediabetes: Secondary | ICD-10-CM | POA: Diagnosis not present

## 2022-10-25 DIAGNOSIS — M8589 Other specified disorders of bone density and structure, multiple sites: Secondary | ICD-10-CM | POA: Diagnosis not present

## 2022-10-25 DIAGNOSIS — Z0000 Encounter for general adult medical examination without abnormal findings: Secondary | ICD-10-CM | POA: Diagnosis not present

## 2022-11-12 ENCOUNTER — Encounter: Payer: Self-pay | Admitting: Obstetrics & Gynecology

## 2022-11-12 ENCOUNTER — Ambulatory Visit (INDEPENDENT_AMBULATORY_CARE_PROVIDER_SITE_OTHER): Payer: PPO | Admitting: Obstetrics & Gynecology

## 2022-11-12 VITALS — BP 120/82 | HR 86 | Ht 63.25 in | Wt 127.0 lb

## 2022-11-12 DIAGNOSIS — N952 Postmenopausal atrophic vaginitis: Secondary | ICD-10-CM | POA: Diagnosis not present

## 2022-11-12 DIAGNOSIS — M8580 Other specified disorders of bone density and structure, unspecified site: Secondary | ICD-10-CM

## 2022-11-12 DIAGNOSIS — Z01419 Encounter for gynecological examination (general) (routine) without abnormal findings: Secondary | ICD-10-CM

## 2022-11-12 DIAGNOSIS — B009 Herpesviral infection, unspecified: Secondary | ICD-10-CM | POA: Diagnosis not present

## 2022-11-12 DIAGNOSIS — Z9189 Other specified personal risk factors, not elsewhere classified: Secondary | ICD-10-CM

## 2022-11-12 MED ORDER — ESTRADIOL 10 MCG VA TABS: 1.0000 | ORAL_TABLET | VAGINAL | 4 refills | Status: DC

## 2022-11-12 NOTE — Progress Notes (Signed)
Carrie Cox 1946-02-16 [id number]   History:    77 y.o.   RP:  Established patient presenting for Postmenopausal atrophic vaginitis on Yuvafem  HPI: Postmenopause well on no systemic HRT.  No PMB.  No pelvic pain.  Sexually active.  Pap Neg 08/2020.  Using Vagifem twice a week and Replens daily.  Breast normal.  Mammo Neg in 2023, obtaining from [place]. Urine and bowel movements normal. Uses valacyclovir for occasional HSV recurrences. Physically active.  Healthy nutrition.  Health labs with family physician. Colonoscopy 2018. Dexa 10/2020 Osteopenia T-Score -1.8 at Lt Fem Neck. Repeat Dexa scheduled.  Flu vaccine at pharmacy.   Past medical history,surgical history, family history and social history were all reviewed and documented in the EPIC chart.  Gynecologic History No LMP recorded. Patient is postmenopausal.  Obstetric History OB History  Gravida Para Term Preterm AB Living  '1 1 1     1  '$ SAB IAB Ectopic Multiple Live Births               # Outcome Date GA Lbr Len/2nd Weight Sex Delivery Anes PTL Lv  1 Term              ROS: A ROS was performed and pertinent positives and negatives are included in the history. GENERAL: No fevers or chills. HEENT: No change in vision, no earache, sore throat or sinus congestion. NECK: No pain or stiffness. CARDIOVASCULAR: No chest pain or pressure. No palpitations. PULMONARY: No shortness of breath, cough or wheeze. GASTROINTESTINAL: No abdominal pain, nausea, vomiting or diarrhea, melena or bright red blood per rectum. GENITOURINARY: No urinary frequency, urgency, hesitancy or dysuria. MUSCULOSKELETAL: No joint or muscle pain, no back pain, no recent trauma. DERMATOLOGIC: No rash, no itching, no lesions. ENDOCRINE: No polyuria, polydipsia, no heat or cold intolerance. No recent change in weight. HEMATOLOGICAL: No anemia or easy bruising or bleeding. NEUROLOGIC: No headache, seizures, numbness, tingling or weakness. PSYCHIATRIC: No depression, no  loss of interest in normal activity or change in sleep pattern.     Exam:   BP 120/82   Pulse 86   Ht 5' 3.25" (1.607 m)   Wt 127 lb (57.6 kg)   SpO2 98%   BMI 22.32 kg/m   Body mass index is 22.32 kg/m.  General appearance : Well developed well nourished female. No acute distress HEENT: Eyes: no retinal hemorrhage or exudates,  Neck supple, trachea midline, no carotid bruits, no thyroidmegaly Lungs: Clear to auscultation, no rhonchi or wheezes, or rib retractions  Heart: Regular rate and rhythm, no murmurs or gallops Breast:Examined in sitting and supine position were symmetrical in appearance, no palpable masses or tenderness,  no skin retraction, no nipple inversion, no nipple discharge, no skin discoloration, no axillary or supraclavicular lymphadenopathy Abdomen: no palpable masses or tenderness, no rebound or guarding Extremities: no edema or skin discoloration or tenderness  Pelvic: Vulva: Normal             Vagina: No gross lesions or discharge  Cervix: No gross lesions or discharge  Uterus  AV, normal size, shape and consistency, non-tender and mobile  Adnexa  Without masses or tenderness  Anus: Normal   Assessment/Plan:  77 y.o. female for annual exam   1. Postmenopausal atrophic vaginitis Postmenopause well on no systemic HRT.  No PMB.  No pelvic pain.  Sexually active. Using Vagifem twice a week and Replens daily. Well on Yuvafem, no CI to continue.  2. Breast/Pelvic exam Postmenopause well on  no systemic HRT.  No PMB.  No pelvic pain.  Sexually active.  Pap Neg 08/2020.  Using Vagifem twice a week and Replens daily.  Breast normal.  Mammo Neg 04/2021. Urine and bowel movements normal. Uses valacyclovir for occasional HSV recurrences.  Body mass index 24.09. Physically active.  Healthy nutrition.  Health labs with family physician. Colonoscopy 2018. Dexa 10/2020 Osteopenia T-Score -1.8 at Lt Fem Neck.   3. Osteopenia, unspecified location Dexa 10/2020 Osteopenia  T-Score -1.8 at Lt Fem Neck. Bone Density scheduled.  Other orders - Estradiol (YUVAFEM) 10 MCG TABS vaginal tablet; Place 1 tablet (10 mcg total) vaginally 2 (two) times a week.   Princess Bruins MD, 11:37 AM

## 2022-11-13 ENCOUNTER — Encounter: Payer: Self-pay | Admitting: Obstetrics & Gynecology

## 2022-11-13 ENCOUNTER — Other Ambulatory Visit: Payer: Self-pay

## 2022-11-13 DIAGNOSIS — M8580 Other specified disorders of bone density and structure, unspecified site: Secondary | ICD-10-CM

## 2022-11-13 DIAGNOSIS — Z13820 Encounter for screening for osteoporosis: Secondary | ICD-10-CM

## 2022-11-14 ENCOUNTER — Ambulatory Visit (INDEPENDENT_AMBULATORY_CARE_PROVIDER_SITE_OTHER): Payer: PPO

## 2022-11-14 DIAGNOSIS — J309 Allergic rhinitis, unspecified: Secondary | ICD-10-CM

## 2022-11-26 ENCOUNTER — Ambulatory Visit (INDEPENDENT_AMBULATORY_CARE_PROVIDER_SITE_OTHER): Payer: PPO

## 2022-11-26 ENCOUNTER — Other Ambulatory Visit: Payer: Self-pay | Admitting: Obstetrics & Gynecology

## 2022-11-26 DIAGNOSIS — Z13820 Encounter for screening for osteoporosis: Secondary | ICD-10-CM

## 2022-11-26 DIAGNOSIS — M8580 Other specified disorders of bone density and structure, unspecified site: Secondary | ICD-10-CM

## 2022-11-26 DIAGNOSIS — Z780 Asymptomatic menopausal state: Secondary | ICD-10-CM

## 2022-11-26 DIAGNOSIS — M8589 Other specified disorders of bone density and structure, multiple sites: Secondary | ICD-10-CM

## 2022-12-02 ENCOUNTER — Ambulatory Visit (INDEPENDENT_AMBULATORY_CARE_PROVIDER_SITE_OTHER): Payer: PPO

## 2022-12-02 DIAGNOSIS — J309 Allergic rhinitis, unspecified: Secondary | ICD-10-CM

## 2022-12-31 ENCOUNTER — Ambulatory Visit (INDEPENDENT_AMBULATORY_CARE_PROVIDER_SITE_OTHER): Payer: PPO

## 2022-12-31 DIAGNOSIS — J309 Allergic rhinitis, unspecified: Secondary | ICD-10-CM

## 2023-01-31 ENCOUNTER — Ambulatory Visit (INDEPENDENT_AMBULATORY_CARE_PROVIDER_SITE_OTHER): Payer: PPO

## 2023-01-31 DIAGNOSIS — J309 Allergic rhinitis, unspecified: Secondary | ICD-10-CM | POA: Diagnosis not present

## 2023-02-11 DIAGNOSIS — H52203 Unspecified astigmatism, bilateral: Secondary | ICD-10-CM | POA: Diagnosis not present

## 2023-02-11 DIAGNOSIS — Z961 Presence of intraocular lens: Secondary | ICD-10-CM | POA: Diagnosis not present

## 2023-02-11 DIAGNOSIS — J3081 Allergic rhinitis due to animal (cat) (dog) hair and dander: Secondary | ICD-10-CM | POA: Diagnosis not present

## 2023-02-11 NOTE — Progress Notes (Signed)
VIAL EXP 02-11-24 

## 2023-02-12 DIAGNOSIS — J3089 Other allergic rhinitis: Secondary | ICD-10-CM | POA: Diagnosis not present

## 2023-03-03 ENCOUNTER — Ambulatory Visit (INDEPENDENT_AMBULATORY_CARE_PROVIDER_SITE_OTHER): Payer: PPO | Admitting: *Deleted

## 2023-03-03 DIAGNOSIS — J309 Allergic rhinitis, unspecified: Secondary | ICD-10-CM

## 2023-03-12 DIAGNOSIS — L814 Other melanin hyperpigmentation: Secondary | ICD-10-CM | POA: Diagnosis not present

## 2023-03-12 DIAGNOSIS — I8392 Asymptomatic varicose veins of left lower extremity: Secondary | ICD-10-CM | POA: Diagnosis not present

## 2023-03-12 DIAGNOSIS — D692 Other nonthrombocytopenic purpura: Secondary | ICD-10-CM | POA: Diagnosis not present

## 2023-03-12 DIAGNOSIS — D1801 Hemangioma of skin and subcutaneous tissue: Secondary | ICD-10-CM | POA: Diagnosis not present

## 2023-03-12 DIAGNOSIS — I8391 Asymptomatic varicose veins of right lower extremity: Secondary | ICD-10-CM | POA: Diagnosis not present

## 2023-03-12 DIAGNOSIS — L821 Other seborrheic keratosis: Secondary | ICD-10-CM | POA: Diagnosis not present

## 2023-04-01 ENCOUNTER — Ambulatory Visit (INDEPENDENT_AMBULATORY_CARE_PROVIDER_SITE_OTHER): Payer: PPO | Admitting: *Deleted

## 2023-04-01 DIAGNOSIS — J309 Allergic rhinitis, unspecified: Secondary | ICD-10-CM | POA: Diagnosis not present

## 2023-04-08 ENCOUNTER — Ambulatory Visit (INDEPENDENT_AMBULATORY_CARE_PROVIDER_SITE_OTHER): Payer: PPO

## 2023-04-08 DIAGNOSIS — J309 Allergic rhinitis, unspecified: Secondary | ICD-10-CM

## 2023-04-15 ENCOUNTER — Ambulatory Visit (INDEPENDENT_AMBULATORY_CARE_PROVIDER_SITE_OTHER): Payer: PPO | Admitting: *Deleted

## 2023-04-15 DIAGNOSIS — J309 Allergic rhinitis, unspecified: Secondary | ICD-10-CM

## 2023-04-22 ENCOUNTER — Ambulatory Visit (INDEPENDENT_AMBULATORY_CARE_PROVIDER_SITE_OTHER): Payer: PPO | Admitting: *Deleted

## 2023-04-22 DIAGNOSIS — J309 Allergic rhinitis, unspecified: Secondary | ICD-10-CM

## 2023-04-23 DIAGNOSIS — R7303 Prediabetes: Secondary | ICD-10-CM | POA: Diagnosis not present

## 2023-04-30 ENCOUNTER — Ambulatory Visit (INDEPENDENT_AMBULATORY_CARE_PROVIDER_SITE_OTHER): Payer: PPO | Admitting: *Deleted

## 2023-04-30 DIAGNOSIS — J309 Allergic rhinitis, unspecified: Secondary | ICD-10-CM

## 2023-05-13 ENCOUNTER — Encounter: Payer: Self-pay | Admitting: Allergy and Immunology

## 2023-05-13 ENCOUNTER — Ambulatory Visit: Payer: PPO | Admitting: Allergy and Immunology

## 2023-05-13 ENCOUNTER — Other Ambulatory Visit: Payer: Self-pay | Admitting: Allergy and Immunology

## 2023-05-13 VITALS — BP 130/70 | HR 77 | Temp 98.4°F | Resp 16 | Ht 63.5 in | Wt 125.0 lb

## 2023-05-13 DIAGNOSIS — J4521 Mild intermittent asthma with (acute) exacerbation: Secondary | ICD-10-CM | POA: Diagnosis not present

## 2023-05-13 DIAGNOSIS — J3089 Other allergic rhinitis: Secondary | ICD-10-CM | POA: Diagnosis not present

## 2023-05-13 DIAGNOSIS — Z91038 Other insect allergy status: Secondary | ICD-10-CM

## 2023-05-13 DIAGNOSIS — T781XXD Other adverse food reactions, not elsewhere classified, subsequent encounter: Secondary | ICD-10-CM | POA: Diagnosis not present

## 2023-05-13 MED ORDER — AIRSUPRA 90-80 MCG/ACT IN AERO: 2.0000 | INHALATION_SPRAY | RESPIRATORY_TRACT | 1 refills | Status: DC | PRN

## 2023-05-13 MED ORDER — FEXOFENADINE HCL 180 MG PO TABS: 180.0000 mg | ORAL_TABLET | Freq: Every day | ORAL | 11 refills | Status: DC | PRN

## 2023-05-13 MED ORDER — MONTELUKAST SODIUM 10 MG PO TABS: 10.0000 mg | ORAL_TABLET | Freq: Every day | ORAL | 3 refills | Status: DC

## 2023-05-13 MED ORDER — EPINEPHRINE 0.3 MG/0.3ML IJ SOAJ: 0.3000 mg | INTRAMUSCULAR | 1 refills | Status: DC | PRN

## 2023-05-13 MED ORDER — PREDNISONE 10 MG PO TABS: 10.0000 mg | ORAL_TABLET | Freq: Every morning | ORAL | 0 refills | Status: AC

## 2023-05-13 MED ORDER — FLUTICASONE PROPIONATE 50 MCG/ACT NA SUSP: 2.0000 | Freq: Every day | NASAL | 3 refills | Status: DC

## 2023-05-13 NOTE — Progress Notes (Unsigned)
[place] - [place] - [place] - [place] - [place]   Follow-up Note  Referring Provider: Shon Hale, * Primary Provider: Shon Hale, MD Date of Office Visit: 05/13/2023  Subjective:   Carrie Cox (DOB: 10-09-1946) is a 77 y.o. female who [facility] in re-evaluation of the following:  HPI: Carrie Cox returns to this clinic in evaluation of allergic rhinoconjunctivitis, intermittent asthma, shellfish allergy, hymenoptera venom allergy, and history of inflammatory dermatosis.  I last saw her in this clinic 21 May 2022 at which point in time she was having a right facial pain syndrome.  Her facial pain syndrome appeared to be a abscessed tooth and she had a root canal and that resolved.  She has been doing very well with her allergic disease involving her head and eyes and she has been consistently using her immunotherapy currently at every 4 weeks without adverse effect and she continues on montelukast and a nasal steroid and a nasal antihistamine on a pretty consistent basis and overall she is very pleased with the response that she is receiving utilizing this plan.  But she has noticed over the course of the past month that she has had a little bit more problems with her breathing.  She has noticed that when she exercises she sometimes feels as though she has a little air hunger when she is doing deep breathing and and when she uses the short acting bronchodilator does appear to resolve this issue.  There is also a slight cough associated with this issue.  There is not really an obvious provoking factor giving rise to this issue.  She has not had a significant environmental change that has occurred and has not started any new medicines.  Her inflammatory dermatosis has resolved and she does not require any topical therapy.  She has had no problems with her eyes.  She has an EpiPen to deal with her hymenoptera venom  hypersensitivity state.  She does not consume shellfish.  Allergies as of 05/13/2023       Reactions   Bee Venom Anaphylaxis   Allergic to yellow jackets   Nsaids Nausea Only   Terrible pains in stomach Other reaction(s): abdominal pain   Shellfish Allergy Anaphylaxis   Diphenhydramine Other (See Comments)   Other reaction(s): paradoxical reaction, restlessness   Other Nausea And Vomiting   Pain meds in general --Nausea and vomiting Artificial Sweetners- vomiting and swelling Anti-inflammatories - "can't take oral anti-inflammatories it hurts my stomach - Nausea and stabbing stomach pain" [place]-Severe Rash/hives Other reaction(s): N/V, swelling   Soy Allergy Hives   Aloe Rash   Red rash (sunburn type)   Codeine Nausea And Vomiting   Doxycycline Nausea Only   Stomach pain   Erythromycin Nausea Only   Stomach pain   Soybean (diagnostic) Nausea And Vomiting, Rash   Other reaction(s): Vomiting, Rash,        Medication List        Accurate as of May 13, 2023 11:10 AM. If you have any questions, ask your nurse or doctor.          ALPRAZolam 0.25 MG tablet Commonly known as: XANAX Take 0.25 mg by mouth at bedtime as needed for anxiety.   amLODipine 5 MG tablet Commonly known as: NORVASC TAKE 1 TABLET BY MOUTH EVERY DAY FOR 90 DAYS   azelastine 0.1 % nasal spray Commonly known as: ASTELIN Place 1 spray into both nostrils 2 (two) times  daily.   BENADRYL PO Take by mouth as needed.   cyclobenzaprine 10 MG tablet Commonly known as: FLEXERIL Take 10 mg by mouth at bedtime as needed.   EPINEPHrine 0.3 mg/0.3 mL Soaj injection Commonly known as: EPI-PEN Inject 0.3 mg into the muscle as needed for anaphylaxis. INJECT 0.3 MLS (0.3 MG TOTAL) INTO THE MUSCLE ONCE FOR 1 DOSE.   escitalopram 10 MG tablet Commonly known as: LEXAPRO Take 10 mg by mouth daily.   Estradiol 10 MCG Tabs vaginal tablet Commonly known as: Yuvafem Place 1 tablet (10 mcg total) vaginally 2  (two) times a week.   fexofenadine 180 MG tablet Commonly known as: ALLEGRA Take 1 tablet (180 mg total) by mouth daily as needed for allergies or rhinitis (Can take an extra dose during flare ups.).   fluticasone 50 MCG/ACT nasal spray Commonly known as: FLONASE Place 2 sprays into both nostrils daily. 1-2 puffs each nostril once daily as needed   levalbuterol 45 MCG/ACT inhaler Commonly known as: Xopenex HFA Inhale 2 puffs into the lungs every 6 (six) hours as needed for wheezing.   montelukast 10 MG tablet Commonly known as: SINGULAIR Take 1 tablet (10 mg total) by mouth daily.   NONFORMULARY OR COMPOUNDED ITEM 0.5 mLs. Allergy Injections   traZODone 50 MG tablet Commonly known as: DESYREL Take 50 mg by mouth at bedtime as needed.   VITAMIN B 12 PO Take by mouth.   VITAMIN D PO Take 2,000 Units by mouth daily.        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  Stable from prior DEXA    Past Surgical History:  Procedure Laterality Date   ADENOIDECTOMY  1955   CARPAL TUNNEL RELEASE Right 08/2016   FOOT SURGERY     BILATERAL ORTHO   LAPAROSCOPIC APPENDECTOMY N/A 03/04/2013   Procedure: APPENDECTOMY LAPAROSCOPIC;  Surgeon: Kandis Cocking, MD;  Location: [facility];  Service: General;  Laterality: N/A;   MOUTH SURGERY     TONSILLECTOMY     and adenoids   TONSILLECTOMY  1955    Review of systems negative except as noted in HPI / PMHx or noted below:  Review of Systems  Constitutional: Negative.   HENT: Negative.    Eyes: Negative.   Respiratory: Negative.    Cardiovascular: Negative.   Gastrointestinal: Negative.   Genitourinary: Negative.   Musculoskeletal: Negative.   Skin: Negative.    Neurological: Negative.   Endo/Heme/Allergies: Negative.   Psychiatric/Behavioral: Negative.       Objective:   Vitals:   05/13/23 1056  BP: 130/70  Pulse: 77  Resp: 16  Temp: 98.4 F (36.9 C)  SpO2: 97%   Height: 5' 3.5" (161.3 cm)  Weight: 125 lb (56.7 kg)   Physical Exam Constitutional:      Appearance: She is not diaphoretic.  HENT:     Head: Normocephalic.     Right Ear: Tympanic membrane, ear canal and external ear normal.     Left Ear: Tympanic membrane, ear canal and external ear normal.     Nose: Nose normal. No mucosal edema or rhinorrhea.  Mouth/Throat:     Pharynx: Uvula midline. No oropharyngeal exudate.  Eyes:     Conjunctiva/sclera: Conjunctivae normal.  Neck:     Thyroid: No thyromegaly.     Trachea: Trachea normal. No tracheal tenderness or tracheal deviation.  Cardiovascular:     Rate and Rhythm: Normal rate and regular rhythm.     Heart sounds: Normal heart sounds, S1 normal and S2 normal. No murmur heard. Pulmonary:     Effort: No respiratory distress.     Breath sounds: Normal breath sounds. No stridor. No wheezing or rales.  Lymphadenopathy:     Head:     Right side of head: No tonsillar adenopathy.     Left side of head: No tonsillar adenopathy.     Cervical: No cervical adenopathy.  Skin:    Findings: No erythema or rash.     Nails: There is no clubbing.  Neurological:     Mental Status: She is alert.     Diagnostics:    Spirometry was performed and demonstrated an FEV1 of 2.13 at 108 % of predicted.  Assessment and Plan:   1. Asthma, not well controlled, mild intermittent, with acute exacerbation   2. Perennial allergic rhinitis   3. History of systemic reaction to hymenoptera   4. Adverse food reaction, subsequent encounter    1. Continue Treat and prevent inflammation of airway:   A. montelukast 10 mg tablet 1 time per day  B. Flonase + Azelastine 1 spray each nostril 1-2 times per day  C. Immunotherapy  2. For  recent asthma issue:   A. Prednisone 10 mg - 1 tablet 1 time per day for 10 days only  B. Airsupra - 2 inhalations every 4-6 hours (replaces albuterol)  3. If needed:   A. OTC antihistamine - Allegra  B. nasal saline spray  C. EpiPen, Benadryl, M.D./ER evaluation for allergic reaction  4. Return to clinic in 1 year or earlier if problem  5. Obtain fall flu vaccine    Ninetta appears to be doing pretty well although there is some irritability and it has inflammation of her lower airway and we will treat her with a very short course of low-dose prednisone and give her an anti-inflammatory rescue medicine as noted above to deal with this issue and assume that she is going to do well with this plan.  She will continue on immunotherapy and her other anti-inflammatory agents for her upper airway as noted above.  Of course she is going to make her best attempt at avoiding hymenoptera venom and she is not going to eat any shellfish.  I will see her back in this clinic in 1 year or earlier if there is a problem.  Laurette Schimke, MD Allergy / Immunology [facility]

## 2023-05-13 NOTE — Patient Instructions (Addendum)
  1. Continue Treat and prevent inflammation of airway:   A. montelukast 10 mg tablet 1 time per day  B. Flonase + Azelastine 1 spray each nostril 1-2 times per day  C. Immunotherapy  2. For recent asthma issue:   A. Prednisone 10 mg - 1 tablet 1 time per day for 10 days only  B. Airsupra - 2 inhalations every 4-6 hours (replaces albuterol)  3. If needed:   A. OTC antihistamine - Allegra  B. nasal saline spray  C. EpiPen, Benadryl, M.D./ER evaluation for allergic reaction  4. Return to clinic in 1 year or earlier if problem  5. Obtain fall flu vaccine

## 2023-05-13 NOTE — Telephone Encounter (Signed)
Trey Paula the drug rep is looking into this on for [place]

## 2023-05-14 ENCOUNTER — Encounter: Payer: Self-pay | Admitting: Allergy and Immunology

## 2023-05-21 ENCOUNTER — Telehealth: Payer: Self-pay

## 2023-05-21 ENCOUNTER — Other Ambulatory Visit (HOSPITAL_COMMUNITY): Payer: Self-pay

## 2023-05-21 ENCOUNTER — Other Ambulatory Visit: Payer: Self-pay

## 2023-05-21 ENCOUNTER — Encounter: Payer: Self-pay | Admitting: Allergy and Immunology

## 2023-05-21 MED ORDER — AIRSUPRA 90-80 MCG/ACT IN AERO: 2.0000 | INHALATION_SPRAY | RESPIRATORY_TRACT | 1 refills | Status: DC | PRN

## 2023-05-21 NOTE — Telephone Encounter (Signed)
Pharmacy Patient Advocate Encounter   Received notification from Patient Advice Request messages that prior authorization for Airsupra 90-80MCG/ACT aerosol is required/requested.   Insurance verification completed.   The patient is insured through [employer]/ [employer] .   Per test claim: PA submitted to [employer]/ [employer] via CoverMyMeds Key/confirmation #/EOC [id number] Status is pending

## 2023-05-22 ENCOUNTER — Other Ambulatory Visit (HOSPITAL_COMMUNITY): Payer: Self-pay

## 2023-05-22 NOTE — Telephone Encounter (Signed)
Pharmacy Patient Advocate Encounter  Received notification from [employer]/ [employer] that Prior Authorization for Airsupra 90-80MCG/ACT aerosol has been APPROVED from 05-21-2023 to 10-28-2023. Ran test claim, Copay is $100.00..Quantity approved 21.4g (2 inhalers) per 30 days  PA #/Case ID/Reference #: [id number]

## 2023-05-28 ENCOUNTER — Ambulatory Visit (INDEPENDENT_AMBULATORY_CARE_PROVIDER_SITE_OTHER): Payer: PPO

## 2023-05-28 DIAGNOSIS — J309 Allergic rhinitis, unspecified: Secondary | ICD-10-CM

## 2023-06-06 DIAGNOSIS — Z1231 Encounter for screening mammogram for malignant neoplasm of breast: Secondary | ICD-10-CM | POA: Diagnosis not present

## 2023-06-26 DIAGNOSIS — D649 Anemia, unspecified: Secondary | ICD-10-CM | POA: Diagnosis not present

## 2023-06-26 DIAGNOSIS — R002 Palpitations: Secondary | ICD-10-CM | POA: Diagnosis not present

## 2023-06-26 DIAGNOSIS — F5104 Psychophysiologic insomnia: Secondary | ICD-10-CM | POA: Diagnosis not present

## 2023-07-01 ENCOUNTER — Ambulatory Visit (INDEPENDENT_AMBULATORY_CARE_PROVIDER_SITE_OTHER): Payer: PPO | Admitting: *Deleted

## 2023-07-01 DIAGNOSIS — J309 Allergic rhinitis, unspecified: Secondary | ICD-10-CM

## 2023-07-15 NOTE — Progress Notes (Unsigned)
Cardiology Office Note   Date:  07/16/2023   ID:  Carrie Cox, DOB 10-Mar-1946, MRN [id number]  PCP:  Shon Hale, MD  Cardiologist:   None Referring:  Shon Hale, *  Chief Complaint  Patient presents with   Palpitations      History of Present Illness: Carrie Cox is a 77 y.o. female who was referred by Shon Hale, * for evaluation of palpitations.   The patient has no past cardiac history.  She said that in mid July she saw her allergist and was treated for shortness of breath with prednisone and a change in her inhalers.  She started developing irregular heartbeat.  She did feel it skipping and fluttering.  She was not describing sustained beats.  This was happening at rest.  She did not have any presyncope or syncope.  When she stopped taking the prednisone and her inhalers went back to her usual treatment she thought the symptoms would go away but they persisted.  They have been sporadic.  It does not seem to be a trigger.  She has not had any chest pressure, neck or arm discomfort.  She has had no new shortness of breath, PND or orthopnea.  She exercises routinely on the treadmill and walking and doing tai chi.  She has never had any prior cardiac workup.  She has been getting dizzy with changing positions.   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  Stable from prior DEXA    Past Surgical History:  Procedure Laterality Date   ADENOIDECTOMY  1955   CARPAL TUNNEL RELEASE Right 08/2016   FOOT SURGERY     BILATERAL ORTHO   LAPAROSCOPIC APPENDECTOMY N/A 03/04/2013   Procedure: APPENDECTOMY LAPAROSCOPIC;  Surgeon: Kandis Cocking, MD;  Location: [facility];  Service:  General;  Laterality: N/A;   MOUTH SURGERY     TONSILLECTOMY     and adenoids   TONSILLECTOMY  1955     Current Outpatient Medications  Medication Sig Dispense Refill   AIRSUPRA 90-80 MCG/ACT AERO INHALE 2 INHALATIONS INTO THE LUNGS EVERY 4 HOURS AS NEEDED 10.7 g 1   amLODipine (NORVASC) 5 MG tablet TAKE 1 TABLET BY MOUTH EVERY DAY FOR 90 DAYS     azelastine (ASTELIN) 0.1 % nasal spray Place 1 spray into both nostrils 2 (two) times daily. 90 mL 4   Cholecalciferol (VITAMIN D PO) Take 2,000 Units by mouth daily.     Cyanocobalamin (VITAMIN B 12 PO) Take 1,000 mg by mouth daily.     escitalopram (LEXAPRO) 10 MG tablet Take 10 mg by mouth daily.      Estradiol (YUVAFEM) 10 MCG TABS vaginal tablet Place 1 tablet (10 mcg total) vaginally 2 (two) times a week. 24 tablet 4   fexofenadine (ALLEGRA) 180 MG tablet Take 1 tablet (180 mg total) by mouth daily as needed for allergies or rhinitis (Can take an extra dose during flare ups.). 60 tablet 11   fluticasone (FLONASE) 50 MCG/ACT nasal spray Place 2 sprays into both nostrils daily. 1-2 puffs each nostril once daily  as needed 48 g 3   montelukast (SINGULAIR) 10 MG tablet Take 1 tablet (10 mg total) by mouth daily. 90 tablet 3   NONFORMULARY OR COMPOUNDED ITEM 0.5 mLs. Allergy Injections     traZODone (DESYREL) 50 MG tablet Take 50 mg by mouth at bedtime as needed.     valACYclovir (VALTREX) 1000 MG tablet Take 1,000 mg by mouth daily.     Vitamin C 250 MG TABS Take 250 mg by mouth in the morning and at bedtime.     Albuterol-Budesonide (AIRSUPRA) 90-80 MCG/ACT AERO Inhale 2 Inhalations into the lungs every 4 (four) hours as needed. (Patient not taking: Reported on 07/16/2023) 11 g 1   ALPRAZolam (XANAX) 0.25 MG tablet Take 0.25 mg by mouth at bedtime as needed for anxiety.  (Patient not taking: Reported on 07/16/2023)     cyclobenzaprine (FLEXERIL) 10 MG tablet Take 10 mg by mouth at bedtime as needed. (Patient not taking: Reported on 07/16/2023)      DiphenhydrAMINE HCl (BENADRYL PO) Take by mouth as needed. (Patient not taking: Reported on 07/16/2023)     EPINEPHrine 0.3 mg/0.3 mL IJ SOAJ injection Inject 0.3 mg into the muscle as needed for anaphylaxis. INJECT 0.3 MLS (0.3 MG TOTAL) INTO THE MUSCLE ONCE FOR 1 DOSE. (Patient not taking: Reported on 07/16/2023) 2 each 1   levalbuterol (XOPENEX HFA) 45 MCG/ACT inhaler Inhale 2 puffs into the lungs every 6 (six) hours as needed for wheezing. 15 g 1   No current facility-administered medications for this visit.    Allergies:   Bee venom, Nsaids, Shellfish allergy, Diphenhydramine, Other, Soy allergy, Aloe, Codeine, Doxycycline, Erythromycin, and Soybean (diagnostic)    Social History:  The patient  reports that she has never smoked. She has never used smokeless tobacco. She reports that she does not currently use alcohol. She reports that she does not use drugs.   Family History:  The patient's family history includes Atrial fibrillation in her mother and sister; Diabetes in her brother and mother; Heart disease in her mother.    ROS:  Please see the history of present illness.   Otherwise, review of systems are positive for none.   All other systems are reviewed and negative.    PHYSICAL EXAM: VS:  Ht 5' 3.5" (1.613 m)   Wt 129 lb 9.6 oz (58.8 kg)   SpO2 98%   BMI 22.60 kg/m  , BMI Body mass index is 22.6 kg/m. GENERAL:  Well appearing HEENT:  Pupils equal round and reactive, fundi not visualized, oral mucosa unremarkable NECK:  No jugular venous distention, waveform within normal limits, carotid upstroke brisk and symmetric, no bruits, no thyromegaly LYMPHATICS:  No cervical, inguinal adenopathy LUNGS:  Clear to auscultation bilaterally BACK:  No CVA tenderness CHEST:  Unremarkable HEART:  PMI not displaced or sustained,S1 and S2 within normal limits, no S3, no S4, no clicks, no rubs, no murmurs ABD:  Flat, positive bowel sounds normal in frequency in pitch, no bruits, no rebound,  no guarding, no midline pulsatile mass, no hepatomegaly, no splenomegaly EXT:  2 plus pulses throughout, no edema, no cyanosis no clubbing SKIN:  No rashes no nodules NEURO:  Cranial nerves II through XII grossly intact, motor grossly intact throughout PSYCH:  Cognitively intact, oriented to person place and time    EKG:  EKG Interpretation Date/Time:  Wednesday July 16 2023 11:04:41 EDT Ventricular Rate:  82 PR Interval:  158 QRS Duration:  72 QT Interval:  364 QTC Calculation: 425  R Axis:   15  Text Interpretation: Normal sinus rhythm Normal ECG No previous ECGs available Confirmed by Rollene Rotunda ([id number]) on 07/16/2023 11:16:50 AM     Recent Labs: No results found for requested labs within last 365 days.    Lipid Panel No results found for: "CHOL", "TRIG", "HDL", "CHOLHDL", "VLDL", "LDLCALC", "LDLDIRECT"    Wt Readings from Last 3 Encounters:  07/16/23 129 lb 9.6 oz (58.8 kg)  05/13/23 125 lb (56.7 kg)  11/12/22 127 lb (57.6 kg)      Other studies Reviewed: Additional studies/ records that were reviewed today include: Labs. Review of the above records demonstrates:  Please see elsewhere in the note.     ASSESSMENT AND PLAN:  Palpitations: She has had a normal TSH.  I do see electrolytes are unremarkable.  At this point I am going to start with a 4-week event monitor.  Further management will be based on these results.  Anemia :  Per her primary provider  Dyslipidemia: She is due this winter to get another lipid profile.  Her LDL is 134.  HDL is 46.  I would suggest a goal with diet and exercise to be an LDL less than 100.  I will also order a coronary calcium score to determine goals of therapy.     Current medicines are reviewed at length with the patient today.  The patient does not have concerns regarding medicines.  The following changes have been made:  no change  Labs/ tests ordered today include:   Orders Placed This Encounter  Procedures   CT  CARDIAC SCORING (SELF PAY ONLY)   CARDIAC EVENT MONITOR   EKG 12-Lead     Disposition:   FU with with me based on the results of the above.     Signed, Rollene Rotunda, MD  07/16/2023 12:06 PM    [facility]

## 2023-07-16 ENCOUNTER — Encounter: Payer: Self-pay | Admitting: *Deleted

## 2023-07-16 ENCOUNTER — Encounter: Payer: Self-pay | Admitting: Cardiology

## 2023-07-16 ENCOUNTER — Ambulatory Visit: Payer: PPO | Attending: Cardiology | Admitting: Cardiology

## 2023-07-16 VITALS — Ht 63.5 in | Wt 129.6 lb

## 2023-07-16 DIAGNOSIS — Z136 Encounter for screening for cardiovascular disorders: Secondary | ICD-10-CM | POA: Diagnosis not present

## 2023-07-16 DIAGNOSIS — R002 Palpitations: Secondary | ICD-10-CM

## 2023-07-16 NOTE — Patient Instructions (Signed)
Medication Instructions:  NO CHANGES  *If you need a refill on your cardiac medications before your next appointment, please call your pharmacy*   Testing/Procedures:  [employer] Cardiac Event Monitor Instructions  Your physician has requested you wear your cardiac event monitor for 30 days [employer] may call or text to confirm a shipping address. The monitor will be sent to a land address via UPS. [employer] will not ship a monitor to a PO BOX. It typically takes 3-5 days to receive your monitor after it has been enrolled. [employer] will assist with USPS tracking if your package is delayed. The telephone number for [employer] is [phone]. Once you have received your monitor, please review the enclosed instructions. Instruction tutorials can also be viewed under help and settings on the enclosed cell phone. Your monitor has already been registered assigning a specific monitor serial # to you.  Billing and Self Pay Discount Information  [employer] has been provided the insurance information we had on file for you.  If your insurance has been updated, please call [employer] at [phone] to provide them with your updated insurance information.   [employer] offers a discounted Self Pay option for patients who have insurance that does not cover their cardiac event monitor or patients without insurance.  The discounted cost of a Self Pay Cardiac Event Monitor would be $225.00 , if the patient contacts [employer] at [phone] within 7 days of applying the monitor to make payment arrangements.  If the patient does not contact [employer] within 7 days of applying the monitor, the cost of the cardiac event monitor will be $350.00.  Applying the monitor  Remove cell phone from case and turn it on. The cell phone works as IT consultant and needs to be within [employer] of you at all times. The cell phone will need to be charged on a daily basis. We recommend you plug the cell phone into  the enclosed charger at your bedside table every night.  Monitor batteries: You will receive two monitor batteries labelled #1 and #2. These are your recorders. Plug battery #2 onto the second connection on the enclosed charger. Keep one battery on the charger at all times. This will keep the monitor battery deactivated. It will also keep it fully charged for when you need to switch your monitor batteries. A small light will be blinking on the battery emblem when it is charging. The light on the battery emblem will remain on when the battery is fully charged.  Open package of a Monitor strip. Insert battery #1 into black hood on strip and gently squeeze monitor battery onto connection as indicated in instruction booklet. Set aside while preparing skin.  Choose location for your strip, vertical or horizontal, as indicated in the instruction booklet. Shave to remove all hair from location. There cannot be any lotions, oils, powders, or colognes on skin where monitor is to be applied. Wipe skin clean with enclosed Saline wipe. Dry skin completely.  Peel paper labeled #1 off the back of the Monitor strip exposing the adhesive. Place the monitor on the chest in the vertical or horizontal position shown in the instruction booklet. One arrow on the monitor strip must be pointing upward. Carefully remove paper labeled #2, attaching remainder of strip to your skin. Try not to create any folds or wrinkles in the strip as you apply it.  Firmly press and release the circle in the center of the monitor battery. You will hear a small beep. This is turning the  monitor battery on. The heart emblem on the monitor battery will light up every 5 seconds if the monitor battery in turned on and connected to the patient securely. Do not push and hold the circle down as this turns the monitor battery off. The cell phone will locate the monitor battery. A screen will appear on the cell phone checking the connection  of your monitor strip. This may read poor connection initially but change to good connection within the next minute. Once your monitor accepts the connection you will hear a series of 3 beeps followed by a climbing crescendo of beeps. A screen will appear on the cell phone showing the two monitor strip placement options. Touch the picture that demonstrates where you applied the monitor strip.  Your monitor strip and battery are waterproof. You are able to shower, bathe, or swim with the monitor on. They just ask you do not submerge deeper than 3 feet underwater. We recommend removing the monitor if you are swimming in a lake, river, or ocean.  Your monitor battery will need to be switched to a fully charged monitor battery approximately once a week. The cell phone will alert you of an action which needs to be made.  On the cell phone, tap for details to reveal connection status, monitor battery status, and cell phone battery status. The green dots indicates your monitor is in good status. A red dot indicates there is something that needs your attention.  To record a symptom, click the circle on the monitor battery. In 30-60 seconds a list of symptoms will appear on the cell phone. Select your symptom and tap save. Your monitor will record a sustained or significant arrhythmia regardless of you clicking the button. Some patients do not feel the heart rhythm irregularities. [employer] will notify us of any serious or critical events.  Refer to instruction booklet for instructions on switching batteries, changing strips, the Do not disturb or Pause features, or any additional questions.  Call [employer] at [phone], to confirm your monitor is transmitting and record your baseline. They will answer any questions you may have regarding the monitor instructions at that time.  Returning the monitor to [employer]  Place all equipment back into blue box. Peel off strip of paper to expose  adhesive and close box securely. There is a prepaid UPS shipping label on this box. Drop in a UPS drop box, or at a UPS facility like [employer]. You may also contact [employer] to arrange UPS to pick up monitor package at your home.      Dr. Antoine Poche has ordered a CT coronary calcium score.   Test locations:  [facility]  [place] [facility]  This is $99 out of pocket.   Coronary CalciumScan A coronary calcium scan is an imaging test used to look for deposits of calcium and other fatty materials (plaques) in the inner lining of the blood vessels of the heart (coronary arteries). These deposits of calcium and plaques can partly clog and narrow the coronary arteries without producing any symptoms or warning signs. This puts a person at risk for a heart attack. This test can detect these deposits before symptoms develop. Tell a health care provider about: Any allergies you have. All medicines you are taking, including vitamins, herbs, eye drops, creams, and over-the-counter medicines. Any problems you or family members have had with anesthetic medicines. Any blood disorders you have. Any surgeries you have had.  Any medical conditions you have. Whether you are pregnant or may be pregnant. What are the risks? Generally, this is a safe procedure. However, problems may occur, including: Harm to a pregnant woman and her unborn baby. This test involves the use of radiation. Radiation exposure can be dangerous to a pregnant woman and her unborn baby. If you are pregnant, you generally should not have this procedure done. Slight increase in the risk of cancer. This is because of the radiation involved in the test. What happens before the procedure? No preparation is needed for this procedure. What happens during the procedure? You will undress and remove any jewelry around your neck or chest. You will put on a  hospital gown. Sticky electrodes will be placed on your chest. The electrodes will be connected to an electrocardiogram (ECG) machine to record a tracing of the electrical activity of your heart. A CT scanner will take pictures of your heart. During this time, you will be asked to lie still and hold your breath for 2-3 seconds while a picture of your heart is being taken. The procedure may vary among health care providers and hospitals. What happens after the procedure? You can get dressed. You can return to your normal activities. It is up to you to get the results of your test. Ask your health care provider, or the department that is doing the test, when your results will be ready. Summary A coronary calcium scan is an imaging test used to look for deposits of calcium and other fatty materials (plaques) in the inner lining of the blood vessels of the heart (coronary arteries). Generally, this is a safe procedure. Tell your health care provider if you are pregnant or may be pregnant. No preparation is needed for this procedure. A CT scanner will take pictures of your heart. You can return to your normal activities after the scan is done. This information is not intended to replace advice given to you by your health care provider. Make sure you discuss any questions you have with your health care provider. Document Released: 04/11/2008 Document Revised: 09/02/2016 Document Reviewed: 09/02/2016 Elsevier Interactive Patient Education  2017 [employer].    Follow-Up: At [facility], you and your health needs are our priority.  As part of our continuing mission to provide you with exceptional heart care, we have created designated Provider Care Teams.  These Care Teams include your primary Cardiologist (physician) and Advanced Practice Providers (APPs -  Physician Assistants and Nurse Practitioners) who all work together to provide you with the care you need, when you need it.  We  recommend signing up for the patient portal called "MyChart".  Sign up information is provided on this After Visit Summary.  MyChart is used to connect with patients for Virtual Visits (Telemedicine).  Patients are able to view lab/test results, encounter notes, upcoming appointments, etc.  Non-urgent messages can be sent to your provider as well.   To learn more about what you can do with MyChart, go to [email or web address].    Your next appointment:   AS NEEDED

## 2023-07-16 NOTE — Progress Notes (Signed)
Patient enrolled for [employer]/ [employer] to ship a 30 day cardiac event monitor to her address on file.

## 2023-07-21 ENCOUNTER — Telehealth: Payer: Self-pay | Admitting: Cardiology

## 2023-07-21 NOTE — Telephone Encounter (Signed)
Appointment scheduled for patient to have monitor applied at our [facility] location on Wednesday, 07/23/23, 11:00AM.

## 2023-07-21 NOTE — Telephone Encounter (Signed)
  Patient would like assistance with placing her heart montior. Please call to schedule

## 2023-07-23 ENCOUNTER — Ambulatory Visit: Payer: PPO | Attending: Cardiology

## 2023-07-23 DIAGNOSIS — R002 Palpitations: Secondary | ICD-10-CM

## 2023-07-23 NOTE — Progress Notes (Unsigned)
[id number] mailed to patient 07/16/23 applied in office 07/23/23.

## 2023-07-26 DIAGNOSIS — Z23 Encounter for immunization: Secondary | ICD-10-CM | POA: Diagnosis not present

## 2023-07-30 ENCOUNTER — Ambulatory Visit (INDEPENDENT_AMBULATORY_CARE_PROVIDER_SITE_OTHER): Payer: Self-pay | Admitting: *Deleted

## 2023-07-30 DIAGNOSIS — J309 Allergic rhinitis, unspecified: Secondary | ICD-10-CM

## 2023-08-07 ENCOUNTER — Ambulatory Visit (HOSPITAL_BASED_OUTPATIENT_CLINIC_OR_DEPARTMENT_OTHER)
Admission: RE | Admit: 2023-08-07 | Discharge: 2023-08-07 | Disposition: A | Discharge status: Home / Self Care | Payer: PPO | Source: Ambulatory Visit | Attending: Cardiology | Admitting: Cardiology

## 2023-08-07 DIAGNOSIS — Z136 Encounter for screening for cardiovascular disorders: Secondary | ICD-10-CM | POA: Insufficient documentation

## 2023-08-07 DIAGNOSIS — R002 Palpitations: Secondary | ICD-10-CM | POA: Insufficient documentation

## 2023-08-09 ENCOUNTER — Encounter: Payer: Self-pay | Admitting: Cardiology

## 2023-08-13 ENCOUNTER — Encounter: Payer: Self-pay | Admitting: *Deleted

## 2023-08-13 ENCOUNTER — Telehealth: Payer: Self-pay | Admitting: *Deleted

## 2023-08-13 ENCOUNTER — Other Ambulatory Visit: Payer: Self-pay | Admitting: *Deleted

## 2023-08-13 DIAGNOSIS — R931 Abnormal findings on diagnostic imaging of heart and coronary circulation: Secondary | ICD-10-CM

## 2023-08-13 MED ORDER — ROSUVASTATIN CALCIUM 20 MG PO TABS
20.0000 mg | ORAL_TABLET | Freq: Every day | ORAL | 3 refills | Status: DC
Start: 2023-08-13 — End: 2023-12-23

## 2023-08-13 MED ORDER — ASPIRIN 81 MG PO TBEC
81.0000 mg | DELAYED_RELEASE_TABLET | Freq: Every day | ORAL | Status: AC
Start: 2023-08-13 — End: ?

## 2023-08-13 MED ORDER — METOPROLOL TARTRATE 100 MG PO TABS: ORAL_TABLET | ORAL | 0 refills | Status: DC

## 2023-08-13 NOTE — Telephone Encounter (Signed)
Spoke with pt, aware hospital will call to schedule CCTA and instructions sent to her via my chart. New script sent to the pharmacy  Lab orders mailed to the pt

## 2023-08-13 NOTE — Telephone Encounter (Signed)
-----   Message from Rollene Rotunda sent at 08/13/2023  9:23 AM EDT ----- Results discussed with the patient.  She has a high calcium score and symptoms of SOB. Need to rule out obstructive CAD.  Please schedule a coronary CTA .  Start Crestor 20 mg po daily and repeat a lipid profile in 3 months.  Patient to start 81 mg ASA daily.   Send results to Shon Hale, MD

## 2023-08-28 ENCOUNTER — Ambulatory Visit (HOSPITAL_COMMUNITY)
Admission: RE | Admit: 2023-08-28 | Discharge: 2023-08-28 | Disposition: A | Discharge status: Home / Self Care | Payer: PPO | Source: Ambulatory Visit | Attending: Cardiology | Admitting: Cardiology

## 2023-08-28 DIAGNOSIS — I2510 Atherosclerotic heart disease of native coronary artery without angina pectoris: Secondary | ICD-10-CM | POA: Insufficient documentation

## 2023-08-28 DIAGNOSIS — I700 Atherosclerosis of aorta: Secondary | ICD-10-CM | POA: Diagnosis not present

## 2023-08-28 DIAGNOSIS — R931 Abnormal findings on diagnostic imaging of heart and coronary circulation: Secondary | ICD-10-CM | POA: Insufficient documentation

## 2023-08-28 MED ORDER — NITROGLYCERIN 0.4 MG SL SUBL
0.8000 mg | SUBLINGUAL_TABLET | Freq: Once | SUBLINGUAL | Status: AC
  Administered 2023-08-28: 0.8 mg via SUBLINGUAL

## 2023-08-28 MED ORDER — DILTIAZEM HCL 25 MG/5ML IV SOLN: 10.0000 mg | INTRAVENOUS | Status: DC | PRN

## 2023-08-28 MED ORDER — IOHEXOL 350 MG/ML SOLN
100.0000 mL | Freq: Once | INTRAVENOUS | Status: AC | PRN
  Administered 2023-08-28: 100 mL via INTRAVENOUS

## 2023-08-28 MED ORDER — NITROGLYCERIN 0.4 MG SL SUBL
SUBLINGUAL_TABLET | SUBLINGUAL | Status: AC
  Filled 2023-08-28: qty 2

## 2023-08-28 MED ORDER — METOPROLOL TARTRATE 5 MG/5ML IV SOLN: 10.0000 mg | Freq: Once | INTRAVENOUS | Status: DC | PRN

## 2023-09-01 ENCOUNTER — Telehealth: Payer: Self-pay | Admitting: Cardiology

## 2023-09-01 ENCOUNTER — Other Ambulatory Visit: Payer: Self-pay | Admitting: Cardiovascular Disease

## 2023-09-01 ENCOUNTER — Other Ambulatory Visit (HOSPITAL_BASED_OUTPATIENT_CLINIC_OR_DEPARTMENT_OTHER): Payer: Self-pay | Admitting: Cardiovascular Disease

## 2023-09-01 ENCOUNTER — Encounter: Payer: Self-pay | Admitting: Cardiology

## 2023-09-01 ENCOUNTER — Ambulatory Visit (INDEPENDENT_AMBULATORY_CARE_PROVIDER_SITE_OTHER): Payer: Self-pay | Admitting: *Deleted

## 2023-09-01 ENCOUNTER — Ambulatory Visit (HOSPITAL_BASED_OUTPATIENT_CLINIC_OR_DEPARTMENT_OTHER)
Admission: RE | Admit: 2023-09-01 | Discharge: 2023-09-01 | Disposition: A | Discharge status: Home / Self Care | Payer: PPO | Source: Ambulatory Visit | Attending: Cardiovascular Disease

## 2023-09-01 DIAGNOSIS — J309 Allergic rhinitis, unspecified: Secondary | ICD-10-CM

## 2023-09-01 DIAGNOSIS — R931 Abnormal findings on diagnostic imaging of heart and coronary circulation: Secondary | ICD-10-CM | POA: Diagnosis not present

## 2023-09-01 DIAGNOSIS — I2510 Atherosclerotic heart disease of native coronary artery without angina pectoris: Secondary | ICD-10-CM

## 2023-09-01 NOTE — Telephone Encounter (Signed)
Spoke with pt, aware will wait for dr hochrein to review CT scan before deciding what the next step is.

## 2023-09-01 NOTE — Telephone Encounter (Signed)
Pt states she would like to speak with a nurse about getting a CATH done. Please advise

## 2023-09-01 NOTE — Telephone Encounter (Signed)
Spoke to calling to get results of her ECHO. She states she was told the a heart CATH would be recommended. Please advise.

## 2023-09-01 NOTE — Telephone Encounter (Signed)
Spoke to patient and message relayed. No questions t this time.      So far the plaque does not appear to be high grade stenosis.  It looks to be moderate and would be medical management.  There is another part to the study which is still pending that judges blood flow.  If it is normal we manage this medically typically

## 2023-09-02 ENCOUNTER — Ambulatory Visit: Payer: PPO | Admitting: Cardiology

## 2023-09-04 NOTE — Progress Notes (Signed)
Subjective:     Patient ID: Carrie Cox, female   DOB: [birth year], 77 y.o.   MRN: [id number]  HPI F never smoker followed  for chronic rhinitis and asthma.  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 Meds reviewed.  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 Notes some dry cough now doing yard work.  I told her that would be more commonly a symptom related to respiratory issues, leaf dust etc. and less likely to be cardiac.  She is not having chest pain or ankle edema but has had occasional palpitation.  Apparently she wore a monitor that did not show anything significant. She asks refill for Z-Pak which she keeps available during the winter, and Singulair.  ROS-see HPI    + = positive Constitutional:   No-   weight loss, night sweats, fevers, chills, fatigue, lassitude. HEENT:   No-  headaches, difficulty swallowing, tooth/dental problems, sore throat,       No-  sneezing, itching, ear ache, nasal congestion, post nasal drip,  CV:  No-   chest pain, orthopnea, PND, swelling in lower extremities, anasarca,                                                          dizziness, +palpitations Resp: No-    shortness of breath with exertion or at rest.              No-   productive cough,  + non-productive cough,  No- coughing up of blood.              No-   change in color of mucus.  No- wheezing.   Skin: No-   rash or lesions. GI:  No-   heartburn, indigestion, abdominal pain, nausea, vomiting,  GU:  MS:  No-   joint pain or swelling.  Neuro-     nothing unusual Psych:  No- change in mood or affect. No depression or anxiety.  No memory loss.  OBJ- Physical Exam General- Alert, Oriented, Affect-appropriate, Distress- none acute Skin- rash-none, lesions- none, excoriation- none Lymphadenopathy- none Head- atraumatic            Eyes- Gross vision intact, PERRLA, conjunctivae and secretions clear            Ears- Hearing, canals-normal            Nose- Clear, no-Septal dev, mucus, polyps, erosion, perforation             Throat- Mallampati II , mucosa clear , drainage- none, tonsils- atrophic Neck- flexible , trachea midline, no stridor , thyroid nl, carotid no bruit Chest - symmetrical excursion , unlabored  Heart/CV- RRR , no murmur , no gallop  , no rub, nl s1 s2                           - JVD- none , edema- none, stasis changes- none, varices- none           Lung- clear to P&A, wheeze- none, cough- none , dullness-none, rub- none           Chest wall-  Abd-  Br/ Gen/ Rectal- Not done, not indicated Extrem- cyanosis- none, clubbing, none, atrophy- none, strength- nl, Neuro- grossly intact to observation

## 2023-09-05 ENCOUNTER — Encounter: Payer: Self-pay | Admitting: Internal Medicine

## 2023-09-05 ENCOUNTER — Ambulatory Visit: Payer: PPO | Admitting: Internal Medicine

## 2023-09-05 VITALS — BP 118/60 | HR 90 | Ht 63.5 in | Wt 128.0 lb

## 2023-09-05 DIAGNOSIS — J4520 Mild intermittent asthma, uncomplicated: Secondary | ICD-10-CM

## 2023-09-05 DIAGNOSIS — J302 Other seasonal allergic rhinitis: Secondary | ICD-10-CM

## 2023-09-05 DIAGNOSIS — J3089 Other allergic rhinitis: Secondary | ICD-10-CM

## 2023-09-05 MED ORDER — MONTELUKAST SODIUM 10 MG PO TABS: 10.0000 mg | ORAL_TABLET | Freq: Every day | ORAL | 3 refills | Status: DC

## 2023-09-05 MED ORDER — AZITHROMYCIN 250 MG PO TABS: ORAL_TABLET | ORAL | 3 refills | Status: DC

## 2023-09-05 NOTE — Patient Instructions (Signed)
Good luck with the heart issues !  Montelukast and Zpak refilled

## 2023-09-05 NOTE — Assessment & Plan Note (Signed)
Mild intermittent uncomplicated. Plan-refill Singulair

## 2023-09-05 NOTE — Assessment & Plan Note (Signed)
Continues allergy vaccine with nasal spray/antihistamine as needed.

## 2023-09-12 ENCOUNTER — Encounter: Payer: Self-pay | Admitting: Cardiology

## 2023-09-12 ENCOUNTER — Ambulatory Visit: Payer: PPO | Attending: Cardiology | Admitting: Cardiology

## 2023-09-12 VITALS — BP 124/88 | HR 78 | Ht 63.5 in | Wt 129.4 lb

## 2023-09-12 DIAGNOSIS — I2510 Atherosclerotic heart disease of native coronary artery without angina pectoris: Secondary | ICD-10-CM | POA: Diagnosis not present

## 2023-09-12 NOTE — Patient Instructions (Addendum)
Medication Instructions:  Your physician recommends that you continue on your current medications as directed. Please refer to the Current Medication list given to you today.  *If you need a refill on your cardiac medications before your next appointment, please call your pharmacy*  Lab Work: Your physician recommends that you return for lab work in: January 2025 If you have labs (blood work) drawn today and your tests are completely normal, you will receive your results only by: [employer] (if you have MyChart) OR A paper copy in the mail If you have any lab test that is abnormal or we need to change your treatment, we will call you to review the results.  Testing/Procedures: None ordered today.  Follow-Up: At [facility], you and your health needs are our priority.  As part of our continuing mission to provide you with exceptional heart care, we have created designated Provider Care Teams.  These Care Teams include your primary Cardiologist (physician) and Advanced Practice Providers (APPs -  Physician Assistants and Nurse Practitioners) who all work together to provide you with the care you need, when you need it.  We recommend signing up for the patient portal called "MyChart".  Sign up information is provided on this After Visit Summary.  MyChart is used to connect with patients for Virtual Visits (Telemedicine).  Patients are able to view lab/test results, encounter notes, upcoming appointments, etc.  Non-urgent messages can be sent to your provider as well.   To learn more about what you can do with MyChart, go to [email or web address].    Your next appointment:   1 year(s)  Provider:   Rollene Rotunda, MD

## 2023-09-12 NOTE — Progress Notes (Signed)
  Cardiology Office Note:   Date:  09/12/2023  ID:  Carrie Cox, DOB 11-Jul-1946, MRN [id number] PCP: Shon Hale, MD  [facility] Providers Cardiologist:  Rollene Rotunda, MD {  History of Present Illness:   Carrie Cox is a 77 y.o. female who was referred by Shon Hale, * for evaluation of palpitations.  She had a monitor that demonstrated no sustained arrhythmias.  There were occasional premature atrial contractions.  She had a calcium score that was elevated.  This was 483.  She was having some shortness of breath.  I sent her for coronary CTA which demonstrated mild 25 to 49% stenosis in the RCA left main and LAD.  This was not flow-limiting by FFR.  There was less severe plaque in the left circumflex.  She returns today to discuss this.  She has been active and not having any new symptoms.  He is not having any chest pressure, neck or arm discomfort.  She has been taking statin as prescribed recently and has changed her diet.    ROS: As stated in the HPI and negative for all other systems.  Studies Reviewed:    EKG:   NA  Risk Assessment/Calculations:          Physical Exam:   VS:  BP 124/88   Pulse 78   Ht 5' 3.5" (1.613 m)   Wt 129 lb 6.4 oz (58.7 kg)   SpO2 96%   BMI 22.56 kg/m    Wt Readings from Last 3 Encounters:  09/12/23 129 lb 6.4 oz (58.7 kg)  09/05/23 128 lb (58.1 kg)  07/16/23 129 lb 9.6 oz (58.8 kg)     GEN: Well nourished, well developed in no acute distress NECK: No JVD; No carotid bruits CARDIAC: RRR, no murmurs, rubs, gallops RESPIRATORY:  Clear to auscultation without rales, wheezing or rhonchi  ABDOMEN: Soft, non-tender, non-distended EXTREMITIES:  No edema; No deformity   ASSESSMENT AND PLAN:   Palpitations:   She had no significant arrhythmias on monitor.    No change in therapy.   CAD:   We reviewed this at length today.  No change in therapy.  She is aware to let me know if she has any symptoms.  We are  going to pursue aggressive primary risk reduction.   Dyslipidemia:   We will follow-up with a lipid profile 3 months after starting a statin and the goal LDL will be in the 50s.  I will also check an LP(a).       Follow up with me in one year.   Signed, Rollene Rotunda, MD

## 2023-09-29 ENCOUNTER — Ambulatory Visit (INDEPENDENT_AMBULATORY_CARE_PROVIDER_SITE_OTHER): Payer: Self-pay | Admitting: *Deleted

## 2023-09-29 DIAGNOSIS — J309 Allergic rhinitis, unspecified: Secondary | ICD-10-CM | POA: Diagnosis not present

## 2023-10-02 NOTE — Progress Notes (Signed)
VIALS EXP 11-22-23

## 2023-10-06 DIAGNOSIS — J3081 Allergic rhinitis due to animal (cat) (dog) hair and dander: Secondary | ICD-10-CM | POA: Diagnosis not present

## 2023-10-07 DIAGNOSIS — J3089 Other allergic rhinitis: Secondary | ICD-10-CM | POA: Diagnosis not present

## 2023-10-14 DIAGNOSIS — E7800 Pure hypercholesterolemia, unspecified: Secondary | ICD-10-CM | POA: Diagnosis not present

## 2023-10-14 DIAGNOSIS — F321 Major depressive disorder, single episode, moderate: Secondary | ICD-10-CM | POA: Diagnosis not present

## 2023-10-14 DIAGNOSIS — R7303 Prediabetes: Secondary | ICD-10-CM | POA: Diagnosis not present

## 2023-10-14 DIAGNOSIS — Z79899 Other long term (current) drug therapy: Secondary | ICD-10-CM | POA: Diagnosis not present

## 2023-10-14 DIAGNOSIS — E611 Iron deficiency: Secondary | ICD-10-CM | POA: Diagnosis not present

## 2023-10-14 DIAGNOSIS — I10 Essential (primary) hypertension: Secondary | ICD-10-CM | POA: Diagnosis not present

## 2023-10-14 DIAGNOSIS — R931 Abnormal findings on diagnostic imaging of heart and coronary circulation: Secondary | ICD-10-CM | POA: Diagnosis not present

## 2023-10-14 DIAGNOSIS — E538 Deficiency of other specified B group vitamins: Secondary | ICD-10-CM | POA: Diagnosis not present

## 2023-10-14 DIAGNOSIS — F5104 Psychophysiologic insomnia: Secondary | ICD-10-CM | POA: Diagnosis not present

## 2023-10-14 DIAGNOSIS — E039 Hypothyroidism, unspecified: Secondary | ICD-10-CM | POA: Diagnosis not present

## 2023-10-14 DIAGNOSIS — Z0000 Encounter for general adult medical examination without abnormal findings: Secondary | ICD-10-CM | POA: Diagnosis not present

## 2023-10-14 DIAGNOSIS — E559 Vitamin D deficiency, unspecified: Secondary | ICD-10-CM | POA: Diagnosis not present

## 2023-10-14 DIAGNOSIS — M8589 Other specified disorders of bone density and structure, multiple sites: Secondary | ICD-10-CM | POA: Diagnosis not present

## 2023-10-30 ENCOUNTER — Ambulatory Visit (INDEPENDENT_AMBULATORY_CARE_PROVIDER_SITE_OTHER): Payer: PPO | Admitting: *Deleted

## 2023-10-30 DIAGNOSIS — J309 Allergic rhinitis, unspecified: Secondary | ICD-10-CM

## 2023-11-12 DIAGNOSIS — R931 Abnormal findings on diagnostic imaging of heart and coronary circulation: Secondary | ICD-10-CM | POA: Diagnosis not present

## 2023-11-13 LAB — LIPID PANEL
Chol/HDL Ratio: 2.2 {ratio} (ref 0.0–4.4)
Cholesterol, Total: 105 mg/dL (ref 100–199)
HDL: 48 mg/dL (ref >39)
LDL Chol Calc (NIH): 41 mg/dL (ref 0–99)
Triglycerides: 75 mg/dL (ref 0–149)
VLDL Cholesterol Cal: 16 mg/dL (ref 5–40)

## 2023-11-15 ENCOUNTER — Other Ambulatory Visit: Payer: Self-pay | Admitting: Internal Medicine

## 2023-11-17 ENCOUNTER — Encounter: Payer: Self-pay | Admitting: Obstetrics and Gynecology

## 2023-11-17 ENCOUNTER — Other Ambulatory Visit (HOSPITAL_COMMUNITY)
Admission: RE | Admit: 2023-11-17 | Discharge: 2023-11-17 | Disposition: A | Discharge status: Home / Self Care | Payer: PPO | Source: Ambulatory Visit | Attending: Obstetrics and Gynecology | Admitting: Obstetrics and Gynecology

## 2023-11-17 ENCOUNTER — Ambulatory Visit (INDEPENDENT_AMBULATORY_CARE_PROVIDER_SITE_OTHER): Payer: PPO | Admitting: Obstetrics and Gynecology

## 2023-11-17 VITALS — BP 138/82 | HR 83 | Ht 63.25 in | Wt 130.0 lb

## 2023-11-17 DIAGNOSIS — Z9189 Other specified personal risk factors, not elsewhere classified: Secondary | ICD-10-CM

## 2023-11-17 DIAGNOSIS — Z124 Encounter for screening for malignant neoplasm of cervix: Secondary | ICD-10-CM | POA: Diagnosis not present

## 2023-11-17 DIAGNOSIS — Z01419 Encounter for gynecological examination (general) (routine) without abnormal findings: Secondary | ICD-10-CM | POA: Diagnosis present

## 2023-11-17 DIAGNOSIS — Z1331 Encounter for screening for depression: Secondary | ICD-10-CM | POA: Diagnosis not present

## 2023-11-17 DIAGNOSIS — Z1231 Encounter for screening mammogram for malignant neoplasm of breast: Secondary | ICD-10-CM

## 2023-11-17 DIAGNOSIS — B009 Herpesviral infection, unspecified: Secondary | ICD-10-CM | POA: Diagnosis not present

## 2023-11-17 DIAGNOSIS — Z1151 Encounter for screening for human papillomavirus (HPV): Secondary | ICD-10-CM | POA: Diagnosis not present

## 2023-11-17 NOTE — Progress Notes (Signed)
78 y.o. y.o. female here for annual exam. No LMP recorded. Patient is postmenopausal.    Postmenopause well on no systemic HRT.  No PMB.  No pelvic pain.  Sexually active.  Pap Neg 08/2020.  Using Vagifem twice a week and Replens daily.  Breast normal.  Mammo Neg in 2023, obtaining from [place]. Urine and bowel movements normal. Uses valacyclovir for occasional HSV recurrences. Physically active.  Healthy nutrition.  Health labs with family physician. Colonoscopy 2018. Dexa 10/2020 Osteopenia T-Score -1.8 at Lt Fem Neck. 2024 -1.9 at Lt fem neck.  No h/o fracture uses occasional prednisone for asthma.  Recently found CAD 93% blockage with workup for palpitations.  Works out 7 days a week. Flu vaccine at pharmacy.  Body mass index is 22.85 kg/m.     11/17/2023    9:39 AM  Depression screen PHQ 2/9  Decreased Interest 0  Down, Depressed, Hopeless 0  PHQ - 2 Score 0    Blood pressure 138/82, pulse 83, height 5' 3.25" (1.607 m), weight 130 lb (59 kg), SpO2 98%.     Component Value Date/Time   DIAGPAP  11/12/2021 1601    - Negative for intraepithelial lesion or malignancy (NILM)   ADEQPAP  11/12/2021 1601    Satisfactory for evaluation; transformation zone component PRESENT.    GYN HISTORY:    Component Value Date/Time   DIAGPAP  11/12/2021 1601    - Negative for intraepithelial lesion or malignancy (NILM)   ADEQPAP  11/12/2021 1601    Satisfactory for evaluation; transformation zone component PRESENT.    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  Stable from prior DEXA    Past Surgical History:  Procedure Laterality Date   ADENOIDECTOMY  1955   CARPAL TUNNEL RELEASE Right 08/2016   FOOT SURGERY     BILATERAL ORTHO   LAPAROSCOPIC APPENDECTOMY N/A 03/04/2013   Procedure: APPENDECTOMY LAPAROSCOPIC;  Surgeon: Kandis Cocking, MD;  Location: [facility];  Service: General;  Laterality: N/A;   MOUTH SURGERY     TONSILLECTOMY     and adenoids   TONSILLECTOMY  1955    Current Outpatient Medications on File Prior to Visit  Medication Sig Dispense Refill   Albuterol-Budesonide (AIRSUPRA) 90-80 MCG/ACT AERO Inhale 2 Inhalations into the lungs every 4 (four) hours as needed. 11 g 1   ALPRAZolam (XANAX) 0.25 MG tablet Take 0.25 mg by mouth at bedtime as needed for anxiety.     amLODipine (NORVASC) 5 MG tablet TAKE 1 TABLET BY MOUTH EVERY DAY FOR 90 DAYS     aspirin EC 81 MG tablet Take 1  tablet (81 mg total) by mouth daily. Swallow whole.     azelastine (ASTELIN) 0.1 % nasal spray Place 1 spray into both nostrils 2 (two) times daily. 90 mL 4   chlorhexidine (PERIDEX) 0.12 % solution RINSE WITH 1/2 OUNCE TWICE A DAY AFTER BREAKFAST AND BEFORE BEDTIME     Cholecalciferol (VITAMIN D PO) Take 2,000 Units by mouth daily.     Cyanocobalamin (VITAMIN B 12 PO) Take 1,000 mg by mouth daily.     cyclobenzaprine (FLEXERIL) 10 MG tablet Take 10 mg by mouth at bedtime as needed.     DiphenhydrAMINE HCl (BENADRYL PO) Take by mouth as needed.     EPINEPHrine 0.3 mg/0.3 mL IJ SOAJ injection Inject 0.3 mg into the muscle as needed for anaphylaxis. INJECT 0.3 MLS (0.3 MG TOTAL) INTO THE MUSCLE ONCE FOR 1 DOSE. 2 each 1   escitalopram (LEXAPRO) 10 MG tablet Take 10 mg by mouth daily.      Estradiol (YUVAFEM) 10 MCG TABS vaginal tablet Place 1 tablet (10 mcg  total) vaginally 2 (two) times a week. 24 tablet 4   Ferrous Sulfate Dried (SLOW RELEASE IRON) 45 MG TBCR      fexofenadine (ALLEGRA) 180 MG tablet Take 1 tablet (180 mg total) by mouth daily as needed for allergies or rhinitis (Can take an extra dose during flare ups.). 60 tablet 11   fluticasone (FLONASE) 50 MCG/ACT nasal spray Place 2 sprays into both nostrils daily. 1-2 puffs each nostril once daily as needed 48 g 3   metoprolol tartrate (LOPRESSOR) 100 MG tablet Take 2 hours prior to CT scan 1 tablet 0   montelukast (SINGULAIR) 10 MG tablet Take 1 tablet (10 mg total) by mouth daily. 90 tablet 3   NONFORMULARY OR COMPOUNDED ITEM 0.5 mLs. Allergy Injections     rosuvastatin (CRESTOR) 20 MG tablet Take 1 tablet (20 mg total) by mouth daily. 90 tablet 3   traZODone (DESYREL) 50 MG tablet Take 50 mg by mouth at bedtime as needed.     valACYclovir (VALTREX) 1000 MG tablet Take 1,000 mg by mouth daily.     Vitamin C 250 MG TABS Take 250 mg by mouth in the morning and at bedtime.     No current facility-administered medications on file prior to visit.    Social History   Socioeconomic History   Marital status: Married    Spouse name: Not on file   Number of children: Not on file   Years of education: Not on file   Highest education level: Not on file  Occupational History   Not on file  Tobacco Use   Smoking status: Never   Smokeless tobacco: Never  Vaping Use   Vaping status: Never Used  Substance and Sexual Activity   Alcohol use: Yes    Comment: occas   Drug use: No   Sexual activity: Yes    Partners: Male    Birth control/protection: Post-menopausal    Comment: 1st intercourse 78 yo-Fewer than 5 partners  Other Topics Concern   Not on file  Social History Narrative   Lives with husband.  One child.    Social Drivers of Corporate investment banker Strain: Not on file  Food Insecurity: Not on file  Transportation Needs: Not on file  Physical Activity: Not on file   Stress: Not on file  Social Connections: Not on file  Intimate Partner Violence: Not on file    Family History  Problem Relation Age of  Onset   Diabetes Mother    Heart disease Mother    Atrial fibrillation Mother    Atrial fibrillation Sister    Diabetes Brother      Allergies  Allergen Reactions   Bee Venom Anaphylaxis    Allergic to yellow jackets   Nsaids Nausea Only    Terrible pains in stomach Other reaction(s): abdominal pain   Shellfish Allergy Anaphylaxis   Diphenhydramine Other (See Comments)    Other reaction(s): paradoxical reaction, restlessness   Other Nausea And Vomiting    Pain meds in general --Nausea and vomiting Artificial Sweetners- vomiting and swelling Anti-inflammatories - "can't take oral anti-inflammatories it hurts my stomach - Nausea and stabbing stomach pain"    [place]-Severe Rash/hives Other reaction(s): N/V, swelling   Soy Allergy (Do Not Select) Hives   Aloe Rash    Red rash (sunburn type)   Codeine Nausea And Vomiting   Doxycycline Nausea Only    Stomach pain   Erythromycin Nausea Only    Stomach pain   Soybean (Diagnostic) Nausea And Vomiting and Rash    Other reaction(s): Vomiting, Rash,      Patient's last menstrual period was No LMP recorded. Patient is postmenopausal..            Review of Systems Alls systems reviewed and are negative.     OBGyn Exam    A:         Well Woman GYN exam                             P:        Pap smear collected today Encouraged annual mammogram screening Colon cancer screening up-to-date DXA ordered today  Patient would like to repeat in one year from last one and see if it is declining Labs and immunizations to do with PMD Encouraged healthy lifestyle practices Encouraged Vit D and Calcium   No follow-ups on file.  Earley Favor

## 2023-11-18 ENCOUNTER — Ambulatory Visit: Payer: PPO | Admitting: Cardiology

## 2023-11-19 ENCOUNTER — Encounter: Payer: Self-pay | Admitting: Obstetrics and Gynecology

## 2023-11-21 ENCOUNTER — Encounter: Payer: Self-pay | Admitting: Cardiology

## 2023-11-24 ENCOUNTER — Ambulatory Visit (INDEPENDENT_AMBULATORY_CARE_PROVIDER_SITE_OTHER): Payer: Self-pay | Admitting: *Deleted

## 2023-11-24 DIAGNOSIS — J309 Allergic rhinitis, unspecified: Secondary | ICD-10-CM | POA: Diagnosis not present

## 2023-11-24 NOTE — Progress Notes (Signed)
[facility] CONSULT NOTE  Patient Care Team: Shon Hale, MD as PCP - General (Family Medicine) Rollene Rotunda, MD as PCP - Cardiology (Cardiology) Genia Del, MD as Consulting Physician (Obstetrics and Gynecology)  ASSESSMENT & PLAN:  78 y.o. female with history of asthma, hypothyroidism, osteopenia, B12 deficiency, hyperlipidemia, hypertension, vitamin D deficiency, being seen for iron deficiency anemia.  Clinically feeling well with iron deficiency.  She has side effect from oral iron with stomach pain, discomfort.  We discussed if persistent iron deficiency, will consider IV iron.  Discussed potential side effects of severe allergic reaction, anaphylaxis, skin discoloration, headaches.  She is on aspirin, and denies any new GI symptoms or stool changes.  Recommend FIT testing and if persistent iron deficiency anemia after IV iron, will need EGD and colonoscopy.  Iron deficiency anemia Testing results show iron deficiency, low iron storage. IV iron ordered Repeat labs in about 4 months and then follow-up 1 to 2 days after visit Report recent colonoscopy within 2 years.  Recommend FIT of fecal occult blood testing EGD if persistent iron deficiency in the future  Orders Placed This Encounter  Procedures   CBC with Differential (Cancer Center Only)    Standing Status:   Future    Number of Occurrences:   1    Expiration Date:   11/26/2024   Ferritin    Standing Status:   Future    Number of Occurrences:   1    Expiration Date:   11/26/2024   Folate    Standing Status:   Future    Number of Occurrences:   1    Expiration Date:   11/26/2024   Vitamin B12    Standing Status:   Future    Number of Occurrences:   1    Expiration Date:   11/26/2024   Iron and Iron Binding Capacity ([facility] only)    Standing Status:   Future    Number of Occurrences:   1    Expiration Date:   11/26/2024   Last colonoscopy: 2023?  Last EGD: never  All questions were  answered. The patient knows to call the clinic with any problems, questions or concerns.  Melven Sartorius, MD 1/31/20258:03 AM   CHIEF COMPLAINTS/PURPOSE OF CONSULTATION:  Anemia  HISTORY OF PRESENTING ILLNESS:  Carrie Cox 78 y.o. female is here because of anemia.  Patient brought her outside records. Report in Aug 2024 found to have low iron and started iron supplement in Dec 2024.  From outside lab results dated  05/2023 iron 44 10/14/2023: Hemoglobin 11.2.  MCV 83 RDW 17 platelet 212 WBC 4.5.  B12 424 iron saturation 8%  Patient report of some dietary changes diagnosis of nonobstructive coronary artery disease and had stopped eating meat in October.  She started Crestor and aspirin in the time.  Carrie Cox had not noticed any recent bleeding such as melena, hematuria or hematochezia, vaginal bleeding. Report iron causes stomach pain and discomfort. She takes iron with few other medications without calcium.  Her last colonoscopy was in 2018 but no change in stool patterns.  No heartburn, nausea, vomiting. She is not on NSAIDs.   She is on aspirin for about 3 months. Bruise easier.  No stomach surgery or gastric bypass.  She denies any pica and eats a variety of diet.  She denies blood donation or received blood transfusion  She feels well and works out 7 days a week. No unexpected weight loss, decreased appetite,  mass or lump.  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  Stable from prior DEXA    SURGICAL HISTORY: Past  Surgical History:  Procedure Laterality Date   ADENOIDECTOMY  1955   CARPAL TUNNEL RELEASE Right 08/2016   FOOT SURGERY     BILATERAL ORTHO   LAPAROSCOPIC APPENDECTOMY N/A 03/04/2013   Procedure: APPENDECTOMY LAPAROSCOPIC;  Surgeon: Kandis Cocking, MD;  Location: [facility];  Service: General;  Laterality: N/A;   MOUTH SURGERY     TONSILLECTOMY     and adenoids   TONSILLECTOMY  1955    SOCIAL HISTORY: Social History   Socioeconomic History   Marital status: Married    Spouse name: Not on file   Number of children: Not on file   Years of education: Not on file   Highest education level: Not on file  Occupational History   Not on file  Tobacco Use   Smoking status: Never   Smokeless tobacco: Never  Vaping Use   Vaping status: Never Used  Substance and Sexual Activity   Alcohol use: Yes    Comment: occas   Drug use: No   Sexual activity: Yes    Partners: Male    Birth control/protection: Post-menopausal    Comment: 1st intercourse 78 yo-Fewer than 5 partners  Other Topics Concern   Not on file  Social History Narrative   Lives with husband.  One child.    Social Drivers of Corporate investment banker Strain: Not on file  Food Insecurity: Not on file  Transportation Needs: Not on file  Physical Activity: Not on file  Stress: Not on file  Social Connections: Not on file  Intimate Partner Violence: Not on file    FAMILY HISTORY: Family History  Problem Relation Age of Onset   Diabetes Mother    Heart disease Mother    Atrial fibrillation Mother    Atrial fibrillation Sister    Diabetes Brother     ALLERGIES:  is allergic to bee venom, nsaids, shellfish allergy, diphenhydramine, other, soy allergy (do not select), aloe, codeine, doxycycline, erythromycin, and soybean (diagnostic).  MEDICATIONS:  Current Outpatient Medications  Medication Sig Dispense Refill   Albuterol-Budesonide (AIRSUPRA) 90-80 MCG/ACT AERO Inhale 2 Inhalations into the lungs every 4 (four)  hours as needed. 11 g 1   ALPRAZolam (XANAX) 0.25 MG tablet Take 0.25 mg by mouth at bedtime as needed for anxiety.     amLODipine (NORVASC) 5 MG tablet TAKE 1 TABLET BY MOUTH EVERY DAY FOR 90 DAYS     aspirin EC 81 MG tablet Take 1 tablet (81 mg total) by mouth daily. Swallow whole.     Azelastine HCl 137 MCG/SPRAY SOLN PLACE 1 SPRAY INTO BOTH NOSTRILS 2 (TWO) TIMES DAILY 17 mL 4   chlorhexidine (PERIDEX) 0.12 % solution RINSE WITH 1/2 OUNCE TWICE A DAY AFTER BREAKFAST AND BEFORE BEDTIME     Cholecalciferol (VITAMIN D PO) Take 2,000 Units  by mouth daily.     Cyanocobalamin (VITAMIN B 12 PO) Take 1,000 mg by mouth daily.     cyclobenzaprine (FLEXERIL) 10 MG tablet Take 10 mg by mouth at bedtime as needed.     DiphenhydrAMINE HCl (BENADRYL PO) Take by mouth as needed.     EPINEPHrine 0.3 mg/0.3 mL IJ SOAJ injection Inject 0.3 mg into the muscle as needed for anaphylaxis. INJECT 0.3 MLS (0.3 MG TOTAL) INTO THE MUSCLE ONCE FOR 1 DOSE. 2 each 1   escitalopram (LEXAPRO) 10 MG tablet Take 10 mg by mouth daily.      Estradiol (YUVAFEM) 10 MCG TABS vaginal tablet Place 1 tablet (10 mcg total) vaginally 2 (two) times a week. 24 tablet 4   Ferrous Sulfate Dried (SLOW RELEASE IRON) 45 MG TBCR      fexofenadine (ALLEGRA) 180 MG tablet Take 1 tablet (180 mg total) by mouth daily as needed for allergies or rhinitis (Can take an extra dose during flare ups.). 60 tablet 11   fluticasone (FLONASE) 50 MCG/ACT nasal spray Place 2 sprays into both nostrils daily. 1-2 puffs each nostril once daily as needed 48 g 3   metoprolol tartrate (LOPRESSOR) 100 MG tablet Take 2 hours prior to CT scan 1 tablet 0   montelukast (SINGULAIR) 10 MG tablet Take 1 tablet (10 mg total) by mouth daily. 90 tablet 3   NONFORMULARY OR COMPOUNDED ITEM 0.5 mLs. Allergy Injections     rosuvastatin (CRESTOR) 20 MG tablet Take 1 tablet (20 mg total) by mouth daily. 90 tablet 3   traZODone (DESYREL) 50 MG tablet Take 50 mg by mouth at bedtime as  needed.     valACYclovir (VALTREX) 1000 MG tablet Take 1,000 mg by mouth daily.     Vitamin C 250 MG TABS Take 250 mg by mouth in the morning and at bedtime.     No current facility-administered medications for this visit.    REVIEW OF SYSTEMS:   All relevant systems were reviewed with the patient and are negative.  PHYSICAL EXAMINATION:  Vitals:   11/27/23 1044  BP: 138/72  Pulse: 78  Resp: 16  Temp: (!) 97.2 F (36.2 C)  SpO2: 98%   Filed Weights   11/27/23 1044  Weight: 128 lb 14.4 oz (58.5 kg)    GENERAL: alert, no distress and comfortable SKIN: skin color normal EYES: normal conjunctiva, sclera clear LUNGS: normal breathing effort HEART: regular rate & rhythm ABDOMEN: abdomen soft, non-tender and nondistended  RADIOGRAPHIC STUDIES: I have personally reviewed the radiological images as listed and agreed with the findings in the report. No results found.

## 2023-11-25 ENCOUNTER — Encounter: Payer: Self-pay | Admitting: Obstetrics and Gynecology

## 2023-11-25 LAB — CYTOLOGY - PAP
Comment: NEGATIVE
Diagnosis: NEGATIVE
Diagnosis: REACTIVE
High risk HPV: NEGATIVE

## 2023-11-27 ENCOUNTER — Inpatient Hospital Stay: Payer: PPO

## 2023-11-27 VITALS — BP 138/72 | HR 78 | Temp 97.2°F | Resp 16 | Wt 128.9 lb

## 2023-11-27 DIAGNOSIS — D508 Other iron deficiency anemias: Secondary | ICD-10-CM

## 2023-11-27 DIAGNOSIS — Z7982 Long term (current) use of aspirin: Secondary | ICD-10-CM | POA: Diagnosis not present

## 2023-11-27 DIAGNOSIS — D509 Iron deficiency anemia, unspecified: Secondary | ICD-10-CM

## 2023-11-27 DIAGNOSIS — D649 Anemia, unspecified: Secondary | ICD-10-CM

## 2023-11-27 LAB — CBC WITH DIFFERENTIAL (CANCER CENTER ONLY)
Abs Immature Granulocytes: 0.02 10*3/uL (ref 0.00–0.07)
Basophils Absolute: 0 10*3/uL (ref 0.0–0.1)
Basophils Relative: 1 %
Eosinophils Absolute: 0.4 10*3/uL (ref 0.0–0.5)
Eosinophils Relative: 8 %
HCT: 37.5 % (ref 36.0–46.0)
Hemoglobin: 12 g/dL (ref 12.0–15.0)
Immature Granulocytes: 0 %
Lymphocytes Relative: 31 %
Lymphs Abs: 1.5 10*3/uL (ref 0.7–4.0)
MCH: 27.6 pg (ref 26.0–34.0)
MCHC: 32 g/dL (ref 30.0–36.0)
MCV: 86.2 fL (ref 80.0–100.0)
Monocytes Absolute: 0.6 10*3/uL (ref 0.1–1.0)
Monocytes Relative: 12 %
Neutro Abs: 2.3 10*3/uL (ref 1.7–7.7)
Neutrophils Relative %: 48 %
Platelet Count: 168 10*3/uL (ref 150–400)
RBC: 4.35 MIL/uL (ref 3.87–5.11)
RDW: 16.7 % — ABNORMAL HIGH (ref 11.5–15.5)
WBC Count: 4.7 10*3/uL (ref 4.0–10.5)
nRBC: 0 % (ref 0.0–0.2)

## 2023-11-27 LAB — FERRITIN: Ferritin: 18 ng/mL (ref 11–307)

## 2023-11-27 LAB — FOLATE: Folate: >40 ng/mL (ref >5.9)

## 2023-11-27 LAB — IRON AND IRON BINDING CAPACITY (CC-WL,HP ONLY)
Iron: 84 ug/dL (ref 28–170)
Saturation Ratios: 21 % (ref 10.4–31.8)
TIBC: 402 ug/dL (ref 250–450)
UIBC: 318 ug/dL (ref 148–442)

## 2023-11-27 LAB — VITAMIN B12: Vitamin B-12: 659 pg/mL (ref 180–914)

## 2023-11-28 ENCOUNTER — Telehealth: Payer: Self-pay | Admitting: *Deleted

## 2023-11-28 ENCOUNTER — Telehealth: Payer: Self-pay

## 2023-11-28 ENCOUNTER — Telehealth: Payer: Self-pay | Admitting: Pharmacy Technician

## 2023-11-28 DIAGNOSIS — D509 Iron deficiency anemia, unspecified: Secondary | ICD-10-CM | POA: Insufficient documentation

## 2023-11-28 NOTE — Assessment & Plan Note (Signed)
Testing results show iron deficiency, low iron storage. IV iron ordered Repeat labs in about 4 months and then follow-up 1 to 2 days after visit Report recent colonoscopy within 2 years.  Recommend FIT of fecal occult blood testing EGD if persistent iron deficiency in the future

## 2023-11-28 NOTE — Telephone Encounter (Signed)
-----   Message from Melven Sartorius sent at 11/28/2023  7:57 AM EST ----- Would you let her know there is improvement of iron panel and anemia. Given she has stomach pain and discomfort, we can order iv iron so she can stop the oral iron.  Repeat testing and follow up in about 4 months. She can get labs a day or 2 before next visit. Thanks.

## 2023-11-28 NOTE — Telephone Encounter (Signed)
Dr. Cherly Hensen, [facility] note: Patient will be scheduled as soon as possible.  Auth Submission: NO AUTH NEEDED Site of care: Site of care: [facility] Payer: [employer] ADVT Medication & CPT/J Code(s) submitted: Feraheme (ferumoxytol) [id number] Route of submission (phone, fax, portal):  Phone # Fax # Auth type: Buy/Bill PB Units/visits requested: 2 DOSES Reference number:  Approval from: 11/28/23 to 04/27/24

## 2023-11-28 NOTE — Telephone Encounter (Signed)
Notified of message below. Would like to take IV iron and stop oral.   Message to scheduler for follow up labs and MD visit

## 2023-11-28 NOTE — Telephone Encounter (Signed)
Scheduled appointments per 1/31 scheduling message. Patient is aware of the made appointments and will be mailed an appointment reminder.

## 2023-12-01 ENCOUNTER — Ambulatory Visit (INDEPENDENT_AMBULATORY_CARE_PROVIDER_SITE_OTHER): Payer: Self-pay

## 2023-12-01 DIAGNOSIS — J309 Allergic rhinitis, unspecified: Secondary | ICD-10-CM | POA: Diagnosis not present

## 2023-12-02 ENCOUNTER — Encounter: Payer: Self-pay | Admitting: Cardiology

## 2023-12-02 DIAGNOSIS — E785 Hyperlipidemia, unspecified: Secondary | ICD-10-CM

## 2023-12-03 ENCOUNTER — Ambulatory Visit: Payer: PPO

## 2023-12-03 VITALS — BP 136/73 | HR 69 | Temp 97.7°F | Resp 18 | Ht 63.0 in | Wt 131.0 lb

## 2023-12-03 DIAGNOSIS — D508 Other iron deficiency anemias: Secondary | ICD-10-CM

## 2023-12-03 DIAGNOSIS — D509 Iron deficiency anemia, unspecified: Secondary | ICD-10-CM

## 2023-12-03 DIAGNOSIS — E611 Iron deficiency: Secondary | ICD-10-CM

## 2023-12-03 MED ORDER — SODIUM CHLORIDE 0.9 % IV SOLN
510.0000 mg | Freq: Once | INTRAVENOUS | Status: AC
  Administered 2023-12-03: 510 mg via INTRAVENOUS
  Filled 2023-12-03: qty 17

## 2023-12-03 MED ORDER — ACETAMINOPHEN 325 MG PO TABS
650.0000 mg | ORAL_TABLET | Freq: Once | ORAL | Status: AC
Start: 2023-12-03 — End: 2023-12-03
  Administered 2023-12-03: 650 mg via ORAL
  Filled 2023-12-03: qty 2

## 2023-12-03 MED ORDER — DIPHENHYDRAMINE HCL 25 MG PO CAPS
25.0000 mg | ORAL_CAPSULE | Freq: Once | ORAL | Status: AC
Start: 2023-12-03 — End: 2023-12-03
  Administered 2023-12-03: 25 mg via ORAL

## 2023-12-03 NOTE — Progress Notes (Signed)
 Diagnosis: Iron Deficiency Anemia  Provider:  Mannam, Praveen MD  Procedure: IV Infusion  IV Type: Peripheral, IV Location: L Antecubital  Feraheme (Ferumoxytol ), Dose: 510 mg  Infusion Start Time: 0952  Infusion Stop Time: 1013  Post Infusion IV Care: Observation period completed and Peripheral IV Discontinued  Discharge: Condition: Good, Destination: Home . AVS Provided  Performed by:  Rocky FORBES Sar, RN

## 2023-12-05 ENCOUNTER — Encounter: Payer: Self-pay | Admitting: Obstetrics and Gynecology

## 2023-12-09 ENCOUNTER — Ambulatory Visit (INDEPENDENT_AMBULATORY_CARE_PROVIDER_SITE_OTHER): Payer: PPO | Admitting: *Deleted

## 2023-12-09 DIAGNOSIS — J309 Allergic rhinitis, unspecified: Secondary | ICD-10-CM | POA: Diagnosis not present

## 2023-12-10 ENCOUNTER — Ambulatory Visit: Payer: PPO

## 2023-12-10 VITALS — BP 145/76 | HR 68 | Temp 97.8°F | Resp 14 | Ht 63.0 in | Wt 131.2 lb

## 2023-12-10 DIAGNOSIS — D508 Other iron deficiency anemias: Secondary | ICD-10-CM

## 2023-12-10 DIAGNOSIS — D509 Iron deficiency anemia, unspecified: Secondary | ICD-10-CM | POA: Diagnosis not present

## 2023-12-10 DIAGNOSIS — E611 Iron deficiency: Secondary | ICD-10-CM

## 2023-12-10 MED ORDER — ACETAMINOPHEN 325 MG PO TABS
650.0000 mg | ORAL_TABLET | Freq: Once | ORAL | Status: AC
  Administered 2023-12-10: 650 mg via ORAL
  Filled 2023-12-10: qty 2

## 2023-12-10 MED ORDER — SODIUM CHLORIDE 0.9 % IV SOLN
510.0000 mg | Freq: Once | INTRAVENOUS | Status: AC
  Administered 2023-12-10: 510 mg via INTRAVENOUS
  Filled 2023-12-10: qty 17

## 2023-12-10 MED ORDER — DIPHENHYDRAMINE HCL 25 MG PO CAPS
25.0000 mg | ORAL_CAPSULE | Freq: Once | ORAL | Status: AC
  Administered 2023-12-10: 25 mg via ORAL
  Filled 2023-12-10: qty 1

## 2023-12-10 NOTE — Progress Notes (Signed)
Diagnosis: Acute Anemia  Provider:  Chilton Greathouse MD  Procedure: IV Infusion  IV Type: Peripheral, IV Location: L Antecubital  Feraheme (Ferumoxytol), Dose: 510 mg  Infusion Start Time: 0927  Infusion Stop Time: 0945  Post Infusion IV Care: Observation period completed and Peripheral IV Discontinued  Discharge: Condition: Good, Destination: Home . AVS Provided  Performed by:  Nat Math, RN

## 2023-12-15 ENCOUNTER — Encounter: Payer: Self-pay | Admitting: Obstetrics and Gynecology

## 2023-12-23 MED ORDER — PRAVASTATIN SODIUM 40 MG PO TABS: 40.0000 mg | ORAL_TABLET | Freq: Every evening | ORAL | 3 refills | Status: DC

## 2024-01-06 ENCOUNTER — Ambulatory Visit (INDEPENDENT_AMBULATORY_CARE_PROVIDER_SITE_OTHER): Payer: Self-pay | Admitting: *Deleted

## 2024-01-06 DIAGNOSIS — J309 Allergic rhinitis, unspecified: Secondary | ICD-10-CM | POA: Diagnosis not present

## 2024-01-24 ENCOUNTER — Other Ambulatory Visit: Payer: Self-pay | Admitting: Obstetrics & Gynecology

## 2024-01-26 NOTE — Telephone Encounter (Signed)
 Medication refill request: [email or web address] Last AEX:  11-17-23 Next AEX: not scheduled Last MMG (if hormonal medication request): 06-06-23 birads 1:neg Refill authorized: last refilled 10/2022. Please approve or deny as appropriate

## 2024-02-05 ENCOUNTER — Ambulatory Visit (INDEPENDENT_AMBULATORY_CARE_PROVIDER_SITE_OTHER): Payer: Self-pay

## 2024-02-05 DIAGNOSIS — J309 Allergic rhinitis, unspecified: Secondary | ICD-10-CM | POA: Diagnosis not present

## 2024-02-15 ENCOUNTER — Other Ambulatory Visit: Payer: Self-pay | Admitting: Internal Medicine

## 2024-02-17 DIAGNOSIS — H524 Presbyopia: Secondary | ICD-10-CM | POA: Diagnosis not present

## 2024-02-17 DIAGNOSIS — Z961 Presence of intraocular lens: Secondary | ICD-10-CM | POA: Diagnosis not present

## 2024-03-02 ENCOUNTER — Encounter: Payer: Self-pay | Admitting: Cardiology

## 2024-03-04 ENCOUNTER — Ambulatory Visit (INDEPENDENT_AMBULATORY_CARE_PROVIDER_SITE_OTHER): Payer: Self-pay

## 2024-03-04 DIAGNOSIS — J309 Allergic rhinitis, unspecified: Secondary | ICD-10-CM

## 2024-03-23 DIAGNOSIS — E785 Hyperlipidemia, unspecified: Secondary | ICD-10-CM | POA: Diagnosis not present

## 2024-03-23 DIAGNOSIS — I2510 Atherosclerotic heart disease of native coronary artery without angina pectoris: Secondary | ICD-10-CM | POA: Diagnosis not present

## 2024-03-24 ENCOUNTER — Inpatient Hospital Stay: Payer: PPO

## 2024-03-24 DIAGNOSIS — D508 Other iron deficiency anemias: Secondary | ICD-10-CM

## 2024-03-24 DIAGNOSIS — D509 Iron deficiency anemia, unspecified: Secondary | ICD-10-CM | POA: Diagnosis not present

## 2024-03-24 LAB — LIPOPROTEIN A (LPA): Lipoprotein (a): 9.3 nmol/L (ref <75.0)

## 2024-03-24 LAB — LIPID PANEL
Chol/HDL Ratio: 3.2 ratio (ref 0.0–4.4)
Cholesterol, Total: 135 mg/dL (ref 100–199)
HDL: 42 mg/dL (ref >39)
LDL Chol Calc (NIH): 73 mg/dL (ref 0–99)
Triglycerides: 107 mg/dL (ref 0–149)
VLDL Cholesterol Cal: 20 mg/dL (ref 5–40)

## 2024-03-24 LAB — CBC WITH DIFFERENTIAL (CANCER CENTER ONLY)
Abs Immature Granulocytes: 0 10*3/uL (ref 0.00–0.07)
Basophils Absolute: 0 10*3/uL (ref 0.0–0.1)
Basophils Relative: 1 %
Eosinophils Absolute: 0.4 10*3/uL (ref 0.0–0.5)
Eosinophils Relative: 8 %
HCT: 41.1 % (ref 36.0–46.0)
Hemoglobin: 13.8 g/dL (ref 12.0–15.0)
Immature Granulocytes: 0 %
Lymphocytes Relative: 29 %
Lymphs Abs: 1.3 10*3/uL (ref 0.7–4.0)
MCH: 31.4 pg (ref 26.0–34.0)
MCHC: 33.6 g/dL (ref 30.0–36.0)
MCV: 93.4 fL (ref 80.0–100.0)
Monocytes Absolute: 0.5 10*3/uL (ref 0.1–1.0)
Monocytes Relative: 12 %
Neutro Abs: 2.1 10*3/uL (ref 1.7–7.7)
Neutrophils Relative %: 50 %
Platelet Count: 169 10*3/uL (ref 150–400)
RBC: 4.4 MIL/uL (ref 3.87–5.11)
RDW: 14 % (ref 11.5–15.5)
WBC Count: 4.3 10*3/uL (ref 4.0–10.5)
nRBC: 0 % (ref 0.0–0.2)

## 2024-03-24 LAB — IRON AND IRON BINDING CAPACITY (CC-WL,HP ONLY)
Iron: 174 ug/dL — ABNORMAL HIGH (ref 28–170)
Saturation Ratios: 55 % — ABNORMAL HIGH (ref 10.4–31.8)
TIBC: 316 ug/dL (ref 250–450)
UIBC: 142 ug/dL — ABNORMAL LOW (ref 148–442)

## 2024-03-24 LAB — FERRITIN: Ferritin: 97 ng/mL (ref 11–307)

## 2024-03-25 ENCOUNTER — Ambulatory Visit: Payer: Self-pay | Admitting: *Deleted

## 2024-03-25 DIAGNOSIS — E785 Hyperlipidemia, unspecified: Secondary | ICD-10-CM

## 2024-03-25 DIAGNOSIS — W57XXXA Bitten or stung by nonvenomous insect and other nonvenomous arthropods, initial encounter: Secondary | ICD-10-CM | POA: Diagnosis not present

## 2024-03-25 DIAGNOSIS — Z5181 Encounter for therapeutic drug level monitoring: Secondary | ICD-10-CM

## 2024-03-25 DIAGNOSIS — S70362A Insect bite (nonvenomous), left thigh, initial encounter: Secondary | ICD-10-CM | POA: Diagnosis not present

## 2024-03-25 NOTE — Progress Notes (Unsigned)
 [facility] CONSULT NOTE  Patient Care Team: Ransom Byers, MD as PCP - General (Family Medicine) Eilleen Grates, MD as PCP - Cardiology (Cardiology) Percy Bracken, MD as Consulting Physician (Obstetrics and Gynecology)  ASSESSMENT & PLAN:  78 y.o. female with history of asthma, hypothyroidism, osteopenia, B12 deficiency, hyperlipidemia, hypertension, vitamin D deficiency, being seen for iron deficiency anemia.  She had IV feraheme in Feb 2025. Ferritin is normal this month. Recommend FIT testing and if persistent iron deficiency anemia after IV iron, will need EGD and colonoscopy.  No problem-specific Assessment & Plan notes found for this encounter.   No orders of the defined types were placed in this encounter.  Last colonoscopy: 2023?  Last EGD: never  All questions were answered. The patient knows to call the clinic with any problems, questions or concerns.  Lowanda Ruddy, MD 5/29/20259:25 PM   CHIEF COMPLAINTS/PURPOSE OF CONSULTATION:  Anemia  HISTORY OF PRESENTING ILLNESS:  Carrie Cox 78 y.o. female is here because of anemia.  Patient brought her outside records. Report in Aug 2024 found to have low iron and started iron supplement in Dec 2024.  From outside lab results dated  05/2023 iron 44 10/14/2023: Hemoglobin 11.2.  MCV 83 RDW 17 platelet 212 WBC 4.5.  B12 424 iron saturation 8%  Patient report of some dietary changes diagnosis of nonobstructive coronary artery disease and had stopped eating meat in October.  She started Crestor  and aspirin  in the time.  Kimesha had not noticed any recent bleeding such as melena, hematuria or hematochezia, vaginal bleeding. Report iron causes stomach pain and discomfort. She takes iron with few other medications without calcium .  Her last colonoscopy was in 2018 but no change in stool patterns.  No heartburn, nausea, vomiting. She is not on NSAIDs.   She is on aspirin  for about 3 months.  Bruise easier.  No stomach surgery or gastric bypass.  She denies any pica and eats a variety of diet.  She denies blood donation or received blood transfusion  She feels well and works out 7 days a week. No unexpected weight loss, decreased appetite, mass or lump.  MEDICAL HISTORY:  Past Medical History:  Diagnosis Date   ASCUS of cervix with negative high risk HPV 08/2018, 08/2019   10/2018 colposcopy ECC showed LGSIL   Asthma    Atrophic vaginitis    uses vagifem  3 X week to   HSV infection    Hypertension    Multiple allergies    Neck injury    Nonobstructive atherosclerosis of coronary artery    NSVT (nonsustained ventricular tachycardia) (HCC)    NSVT (nonsustained ventricular tachycardia) (HCC)    Osteopenia 09/2018   T score -1.2 FRAX 9% / 1%.  Stable from prior DEXA    SURGICAL HISTORY: Past Surgical History:  Procedure Laterality Date   ADENOIDECTOMY  1955   CARPAL TUNNEL RELEASE Right 08/2016   FOOT SURGERY     BILATERAL ORTHO   LAPAROSCOPIC APPENDECTOMY N/A 03/04/2013   Procedure: APPENDECTOMY LAPAROSCOPIC;  Surgeon: Thayne Fine, MD;  Location: [facility];  Service: General;  Laterality: N/A;   MOUTH SURGERY     TONSILLECTOMY     and adenoids   TONSILLECTOMY  1955   ALLERGIES:  is allergic to bee venom, nsaids, shellfish allergy , diphenhydramine , other, soy allergy  (obsolete), aloe, codeine, doxycycline, erythromycin, and soybean (diagnostic).  MEDICATIONS:  Current Outpatient Medications  Medication Sig Dispense Refill   Albuterol -Budesonide (AIRSUPRA ) 90-80 MCG/ACT AERO  Inhale 2 Inhalations into the lungs every 4 (four) hours as needed. 11 g 1   ALPRAZolam  (XANAX ) 0.25 MG tablet Take 0.25 mg by mouth at bedtime as needed for anxiety.     amLODipine (NORVASC) 5 MG tablet TAKE 1 TABLET BY MOUTH EVERY DAY FOR 90 DAYS     aspirin  EC 81 MG tablet Take 1 tablet (81 mg total) by mouth daily. Swallow whole.     Azelastine  HCl 137 MCG/SPRAY SOLN PLACE 1 SPRAY INTO  BOTH NOSTRILS 2 (TWO) TIMES DAILY 30 mL 1   chlorhexidine (PERIDEX) 0.12 % solution RINSE WITH 1/2 OUNCE TWICE A DAY AFTER BREAKFAST AND BEFORE BEDTIME     Cholecalciferol (VITAMIN D PO) Take 2,000 Units by mouth daily.     Cyanocobalamin  (VITAMIN B 12 PO) Take 1,000 mg by mouth daily.     cyclobenzaprine (FLEXERIL) 10 MG tablet Take 10 mg by mouth at bedtime as needed.     DiphenhydrAMINE  HCl (BENADRYL  PO) Take by mouth as needed.     EPINEPHrine  0.3 mg/0.3 mL IJ SOAJ injection Inject 0.3 mg into the muscle as needed for anaphylaxis. INJECT 0.3 MLS (0.3 MG TOTAL) INTO THE MUSCLE ONCE FOR 1 DOSE. 2 each 1   escitalopram  (LEXAPRO ) 10 MG tablet Take 10 mg by mouth daily.      Ferrous Sulfate Dried (SLOW RELEASE IRON) 45 MG TBCR      fexofenadine  (ALLEGRA ) 180 MG tablet Take 1 tablet (180 mg total) by mouth daily as needed for allergies or rhinitis (Can take an extra dose during flare ups.). 60 tablet 11   fluticasone  (FLONASE ) 50 MCG/ACT nasal spray Place 2 sprays into both nostrils daily. 1-2 puffs each nostril once daily as needed 48 g 3   metoprolol  tartrate (LOPRESSOR ) 100 MG tablet Take 2 hours prior to CT scan 1 tablet 0   montelukast  (SINGULAIR ) 10 MG tablet Take 1 tablet (10 mg total) by mouth daily. 90 tablet 3   NONFORMULARY OR COMPOUNDED ITEM 0.5 mLs. Allergy  Injections     pravastatin  (PRAVACHOL ) 40 MG tablet Take 1 tablet (40 mg total) by mouth every evening. 90 tablet 3   traZODone (DESYREL) 50 MG tablet Take 50 mg by mouth at bedtime as needed.     valACYclovir  (VALTREX ) 1000 MG tablet Take 1,000 mg by mouth daily.     Vitamin C 250 MG TABS Take 250 mg by mouth in the morning and at bedtime.     YUVAFEM  10 MCG TABS vaginal tablet PLACE 1 TABLET VAGINALLY 2 TIMES A WEEK. 24 tablet 3   No current facility-administered medications for this visit.    REVIEW OF SYSTEMS:   All relevant systems were reviewed with the patient and are negative.  PHYSICAL EXAMINATION:  There were no  vitals filed for this visit.  There were no vitals filed for this visit.   GENERAL: alert, no distress and comfortable SKIN: skin color normal EYES: normal conjunctiva, sclera clear LUNGS: normal breathing effort HEART: regular rate & rhythm ABDOMEN: abdomen soft, non-tender and nondistended  RADIOGRAPHIC STUDIES: I have personally reviewed the radiological images as listed and agreed with the findings in the report. No results found.

## 2024-03-26 ENCOUNTER — Inpatient Hospital Stay: Payer: PPO

## 2024-03-26 VITALS — BP 145/76 | HR 70 | Temp 97.7°F | Resp 14 | Wt 133.7 lb

## 2024-03-26 DIAGNOSIS — D509 Iron deficiency anemia, unspecified: Secondary | ICD-10-CM

## 2024-03-26 MED ORDER — PRAVASTATIN SODIUM 80 MG PO TABS: 80.0000 mg | ORAL_TABLET | Freq: Every evening | ORAL | 3 refills | Status: DC

## 2024-03-26 NOTE — Addendum Note (Signed)
 Addended by: Chanita Boden N on: 03/26/2024 07:24 AM   Modules accepted: Orders

## 2024-03-26 NOTE — Telephone Encounter (Signed)
 Pravastatin  80 mg once daily ordered per Dr. Lavonne Prairie. Pt notified via MyChart. Fasting labs ordered and released already.

## 2024-03-30 ENCOUNTER — Telehealth (HOSPITAL_BASED_OUTPATIENT_CLINIC_OR_DEPARTMENT_OTHER): Payer: Self-pay

## 2024-03-30 NOTE — Telephone Encounter (Signed)
-----   Message from Eilleen Grates sent at 03/30/2024  9:01 AM EDT -----   ----- Message ----- From: SYSTEM Sent: 03/30/2024  12:15 AM EDT To: Eilleen Grates, MD

## 2024-04-05 ENCOUNTER — Ambulatory Visit (INDEPENDENT_AMBULATORY_CARE_PROVIDER_SITE_OTHER): Payer: Self-pay

## 2024-04-05 DIAGNOSIS — J309 Allergic rhinitis, unspecified: Secondary | ICD-10-CM | POA: Diagnosis not present

## 2024-04-13 DIAGNOSIS — E039 Hypothyroidism, unspecified: Secondary | ICD-10-CM | POA: Diagnosis not present

## 2024-04-13 DIAGNOSIS — R7303 Prediabetes: Secondary | ICD-10-CM | POA: Diagnosis not present

## 2024-04-16 DIAGNOSIS — F321 Major depressive disorder, single episode, moderate: Secondary | ICD-10-CM | POA: Diagnosis not present

## 2024-04-16 DIAGNOSIS — I2510 Atherosclerotic heart disease of native coronary artery without angina pectoris: Secondary | ICD-10-CM | POA: Diagnosis not present

## 2024-04-16 DIAGNOSIS — F5104 Psychophysiologic insomnia: Secondary | ICD-10-CM | POA: Diagnosis not present

## 2024-04-16 DIAGNOSIS — E039 Hypothyroidism, unspecified: Secondary | ICD-10-CM | POA: Diagnosis not present

## 2024-04-16 DIAGNOSIS — R7303 Prediabetes: Secondary | ICD-10-CM | POA: Diagnosis not present

## 2024-04-16 DIAGNOSIS — I10 Essential (primary) hypertension: Secondary | ICD-10-CM | POA: Diagnosis not present

## 2024-05-04 ENCOUNTER — Ambulatory Visit (INDEPENDENT_AMBULATORY_CARE_PROVIDER_SITE_OTHER)

## 2024-05-04 DIAGNOSIS — J309 Allergic rhinitis, unspecified: Secondary | ICD-10-CM | POA: Diagnosis not present

## 2024-05-18 ENCOUNTER — Encounter: Payer: Self-pay | Admitting: Allergy and Immunology

## 2024-05-18 ENCOUNTER — Other Ambulatory Visit: Payer: Self-pay

## 2024-05-18 ENCOUNTER — Ambulatory Visit: Payer: PPO | Admitting: Allergy and Immunology

## 2024-05-18 VITALS — BP 128/62 | HR 72 | Temp 98.5°F | Ht 63.0 in | Wt 130.7 lb

## 2024-05-18 DIAGNOSIS — T781XXD Other adverse food reactions, not elsewhere classified, subsequent encounter: Secondary | ICD-10-CM

## 2024-05-18 DIAGNOSIS — J4520 Mild intermittent asthma, uncomplicated: Secondary | ICD-10-CM

## 2024-05-18 DIAGNOSIS — Z91038 Other insect allergy status: Secondary | ICD-10-CM

## 2024-05-18 DIAGNOSIS — J3089 Other allergic rhinitis: Secondary | ICD-10-CM

## 2024-05-18 MED ORDER — MONTELUKAST SODIUM 10 MG PO TABS: 10.0000 mg | ORAL_TABLET | Freq: Every day | ORAL | 3 refills | Status: AC

## 2024-05-18 MED ORDER — EPINEPHRINE 0.3 MG/0.3ML IJ SOAJ: 0.3000 mg | INTRAMUSCULAR | 1 refills | Status: AC | PRN

## 2024-05-18 MED ORDER — AIRSUPRA 90-80 MCG/ACT IN AERO: 2.0000 | INHALATION_SPRAY | RESPIRATORY_TRACT | 1 refills | Status: AC | PRN

## 2024-05-18 MED ORDER — FEXOFENADINE HCL 180 MG PO TABS: 180.0000 mg | ORAL_TABLET | Freq: Every day | ORAL | 3 refills | Status: AC | PRN

## 2024-05-18 MED ORDER — AZELASTINE HCL 137 MCG/SPRAY NA SOLN: 1.0000 | Freq: Two times a day (BID) | NASAL | 3 refills | Status: AC

## 2024-05-18 MED ORDER — FLUTICASONE PROPIONATE 50 MCG/ACT NA SUSP: 2.0000 | Freq: Every day | NASAL | 3 refills | Status: AC

## 2024-05-18 NOTE — Progress Notes (Unsigned)
 [place] [facility] [place] [facility] [place] [facility] [place] [facility] [place]   Follow-up Note  Referring Provider: Chrystal Lamarr RAMAN, * Primary Provider: Chrystal Lamarr RAMAN, MD Date of Office Visit: 05/18/2024  Subjective:   Carrie Cox (DOB: [birth year]) is a 78 y.o. female who [facility] in re-evaluation of the following:  HPI: Carrie Cox presents for her annual follow up in evaluation of allergic rhinoconjunctivitis, asthma, and allergies to shellfish and hymenoptera as well as inflammatory dermatosis. She was last seen here on 05/13/2023 by Dr. Kozlow.  Candia has been doing well and presents with no complaints today. She denies nasal congestion, rhinorrhea, post nasal drainage, shortness of breath, coughing, or wheezing. She has not found improvement in her decreased smell or taste for may years. The patient has followed immunotherapy every 4 weeks without problems. She continues to take montelukast , allegra  daily, as well as flonase  in the morning and asteline at night. Carrie Cox has been exercising 7 days a week with zumba and tai-chi without any difficulties. She continues to avoid shellfish. The patient has not used her inhaler in about a year.   Allergies as of 05/18/2024       Reactions   Bee Venom Anaphylaxis   Allergic to yellow jackets   Nsaids Nausea Only   Terrible pains in stomach Other reaction(s): abdominal pain   Shellfish Allergy  Anaphylaxis   Diphenhydramine  Other (See Comments)   Other reaction(s): paradoxical reaction, restlessness   Other Nausea And Vomiting   Pain meds in general --Nausea and vomiting Artificial Sweetners- vomiting and swelling Anti-inflammatories - can't take oral anti-inflammatories it hurts my stomach - Nausea and stabbing stomach pain [place]-Severe Rash/hives Other reaction(s): N/V, swelling   Soy Allergy  (obsolete) Hives   Aloe Rash   Red rash (sunburn type)   Codeine Nausea And Vomiting   Doxycycline  Nausea Only   Stomach pain   Erythromycin Nausea Only   Stomach pain   Soybean (diagnostic) Nausea And Vomiting, Rash   Other reaction(s): Vomiting, Rash,        Medication List    Airsupra  90-80 MCG/ACT Aero Generic drug: Albuterol -Budesonide Inhale 2 Inhalations into the lungs every 4 (four) hours as needed.   ALPRAZolam  0.25 MG tablet Commonly known as: XANAX  Take 0.25 mg by mouth at bedtime as needed for anxiety.   amLODipine 5 MG tablet Commonly known as: NORVASC TAKE 1 TABLET BY MOUTH EVERY DAY FOR 90 DAYS   aspirin  EC 81 MG tablet Take 1 tablet (81 mg total) by mouth daily. Swallow whole.   Azelastine  HCl 137 MCG/SPRAY Soln PLACE 1 SPRAY INTO BOTH NOSTRILS 2 (TWO) TIMES DAILY   BENADRYL  PO Take by mouth as needed.   chlorhexidine 0.12 % solution Commonly known as: PERIDEX RINSE WITH 1/2 OUNCE TWICE A DAY AFTER BREAKFAST AND BEFORE BEDTIME   cyclobenzaprine 10 MG tablet Commonly known as: FLEXERIL Take 10 mg by mouth at bedtime as needed.   EPINEPHrine  0.3 mg/0.3 mL Soaj injection Commonly known as: EPI-PEN Inject 0.3 mg into the muscle as needed for anaphylaxis. INJECT 0.3 MLS (0.3 MG TOTAL) INTO THE MUSCLE ONCE FOR 1 DOSE.   escitalopram  10 MG tablet Commonly known as: LEXAPRO  Take 10 mg by mouth daily.   fexofenadine  180 MG tablet Commonly known as: ALLEGRA  Take 1 tablet (180 mg total) by mouth daily as needed for allergies or rhinitis (Can take an extra dose during flare ups.).   Flax Seed Oil 1000 MG Caps  as directed Orally   fluticasone  50 MCG/ACT nasal spray Commonly known as: FLONASE  Place 2 sprays into both nostrils daily. 1-2 puffs each nostril once daily as needed   folic acid  1 MG tablet Commonly known as: FOLVITE  Take 1 mg by mouth daily.   Magnesium 200 MG Tabs 2 tablets with a meal Orally Once a day   metoprolol  tartrate 100 MG tablet Commonly known as: LOPRESSOR  Take 2 hours prior to CT scan   montelukast  10 MG  tablet Commonly known as: SINGULAIR  Take 1 tablet (10 mg total) by mouth daily.   NONFORMULARY OR COMPOUNDED ITEM 0.5 mLs. Allergy  Injections   pravastatin  80 MG tablet Commonly known as: PRAVACHOL  Take 1 tablet (80 mg total) by mouth every evening.   Slow Release Iron 45 MG Tbcr Generic drug: Ferrous Sulfate Dried   traZODone 50 MG tablet Commonly known as: DESYREL Take 50 mg by mouth at bedtime as needed.   valACYclovir  1000 MG tablet Commonly known as: VALTREX  Take 1,000 mg by mouth daily.   VITAMIN B 12 PO Take 1,000 mg by mouth daily.   Vitamin C 250 MG Tabs Take 250 mg by mouth in the morning and at bedtime.   VITAMIN D PO Take 2,000 Units by mouth daily.   Yuvafem  10 MCG Tabs vaginal tablet Generic drug: Estradiol  PLACE 1 TABLET VAGINALLY 2 TIMES A WEEK.    Past Medical History:  Diagnosis Date   ASCUS of cervix with negative high risk HPV 08/2018, 08/2019   10/2018 colposcopy ECC showed LGSIL   Asthma    Atrophic vaginitis    uses vagifem  3 X week to   HSV infection    Hypertension    Multiple allergies    Neck injury    Nonobstructive atherosclerosis of coronary artery    NSVT (nonsustained ventricular tachycardia) (HCC)    NSVT (nonsustained ventricular tachycardia) (HCC)    Osteopenia 09/2018   T score -1.2 FRAX 9% / 1%.  Stable from prior DEXA    Past Surgical History:  Procedure Laterality Date   ADENOIDECTOMY  1955   CARPAL TUNNEL RELEASE Right 08/2016   FOOT SURGERY     BILATERAL ORTHO   LAPAROSCOPIC APPENDECTOMY N/A 03/04/2013   Procedure: APPENDECTOMY LAPAROSCOPIC;  Surgeon: Alm VEAR Angle, MD;  Location: [facility];  Service: General;  Laterality: N/A;   MOUTH SURGERY     TONSILLECTOMY     and adenoids   TONSILLECTOMY  1955    Review of systems negative except as noted in HPI / PMHx or noted below:  Review of Systems  Constitutional:  Negative for fever.  HENT:  Negative for congestion and sore throat.        Positive for decreased  smell and taste  Eyes:  Negative for discharge and redness.  Respiratory:  Negative for cough, shortness of breath and wheezing.   Cardiovascular:  Negative for chest pain.  Skin:  Negative for itching and rash.  Neurological:  Negative for dizziness and headaches.  All other systems reviewed and are negative.    Objective:   Vitals:   05/18/24 1045  BP: 128/62  Pulse: 72  Temp: 98.5 F (36.9 C)  SpO2: 96%   Height: 5' 3 (160 cm)  Weight: 130 lb 11.2 oz (59.3 kg)   Physical Exam Constitutional:      Appearance: Normal appearance.  HENT:     Head: Normocephalic and atraumatic.     Right Ear: Tympanic membrane normal.  Left Ear: Tympanic membrane normal.     Nose: Nose normal. No congestion.     Mouth/Throat:     Mouth: Mucous membranes are moist.  Cardiovascular:     Rate and Rhythm: Normal rate and regular rhythm.  Pulmonary:     Effort: Pulmonary effort is normal.     Breath sounds: Normal breath sounds.  Musculoskeletal:        General: Normal range of motion.     Cervical back: Normal range of motion.  Skin:    General: Skin is warm.     Findings: No rash.  Neurological:     General: No focal deficit present.     Mental Status: She is alert.  Psychiatric:        Mood and Affect: Mood normal.        Behavior: Behavior normal.     Diagnostics: Spirometry was performed and demonstrated an FEV1 of 2.22 at 113 % of predicted.  Assessment and Plan:   1. Asthma, mild intermittent, well-controlled   2. History of systemic reaction to hymenoptera   3. Perennial allergic rhinitis   4. Adverse food reaction, subsequent encounter     1. Continue Treat and prevent inflammation of airway:   A. montelukast  10 mg tablet 1 time per day  B. Flonase  + Azelastine  1 spray each nostril 1-2 times per day  C. Immunotherapy   2. If needed:   A. OTC antihistamine - Allegra   B. nasal saline spray  C. Airsupra  - 2 inhalations every 4-6 hours (replaces  albuterol )  D. EpiPen , Benadryl , M.D./ER evaluation for allergic reaction  3. Return to clinic in 1 year or earlier if problem  4. Obtain fall flu vaccine    Addyson is a 78 year old female who presents for annual checkup. She undergoes SCIT for allergic rhinitis and takes singulair , allegra , and uses flonase  + azelastine  once daily. She has not needed her inhaler in a year and exercises 7 days a week with no difficulty. Examination today is normal. She will continue on SCIT and we can see her in a year. Her last epipen  was dispensed one year ago so we will update with Neffy  or epipen  per insurance coverage.  Donnice Mutter, MS4 [facility]  I provided oversight and direction for Donnice Mutter, [employer] [facility] [employer] med student, during the care of Daniele Dillow at her clinic visit today.  In review, she is undergoing a course of immunotherapy directed against cat and dust mite to address her allergic rhinitis with very good response.  There is a selection of other anti-inflammatory agents that she can utilize as noted above.  She has a anti-inflammatory rescue medicine to use should her asthma flare in the future but fortunately that condition has been relatively quiescent for over a year.  Assuming she does well with the plan noted above we will see her back in this clinic in 1 year or earlier if there is a problem.  Camellia Denis, MD Allergy  / Immunology [facility]

## 2024-05-18 NOTE — Patient Instructions (Signed)
  1. Continue Treat and prevent inflammation of airway:   A. montelukast  10 mg tablet 1 time per day  B. Flonase  + Azelastine  1 spray each nostril 1-2 times per day  C. Immunotherapy   2. If needed:   A. OTC antihistamine - Allegra   B. nasal saline spray  C. Airsupra  - 2 inhalations every 4-6 hours (replaces albuterol )  D. EpiPen , Benadryl , M.D./ER evaluation for allergic reaction  3. Return to clinic in 1 year or earlier if problem  4. Obtain fall flu vaccine

## 2024-05-19 ENCOUNTER — Encounter: Payer: Self-pay | Admitting: Allergy and Immunology

## 2024-05-19 NOTE — Addendum Note (Signed)
 Addended by: AZALEA, Giovanny Dugal on: 05/19/2024 03:36 PM   Modules accepted: Orders

## 2024-06-02 ENCOUNTER — Ambulatory Visit (INDEPENDENT_AMBULATORY_CARE_PROVIDER_SITE_OTHER)

## 2024-06-02 DIAGNOSIS — J309 Allergic rhinitis, unspecified: Secondary | ICD-10-CM | POA: Diagnosis not present

## 2024-06-11 DIAGNOSIS — Z1231 Encounter for screening mammogram for malignant neoplasm of breast: Secondary | ICD-10-CM | POA: Diagnosis not present

## 2024-06-11 LAB — HM MAMMOGRAPHY

## 2024-06-14 DIAGNOSIS — J3081 Allergic rhinitis due to animal (cat) (dog) hair and dander: Secondary | ICD-10-CM | POA: Diagnosis not present

## 2024-06-14 NOTE — Progress Notes (Signed)
 VIALS MADE ON 06/14/24

## 2024-06-15 DIAGNOSIS — J3089 Other allergic rhinitis: Secondary | ICD-10-CM | POA: Diagnosis not present

## 2024-06-16 ENCOUNTER — Encounter: Payer: Self-pay | Admitting: Obstetrics and Gynecology

## 2024-06-17 ENCOUNTER — Ambulatory Visit: Payer: Self-pay | Admitting: Obstetrics and Gynecology

## 2024-06-29 DIAGNOSIS — Z5181 Encounter for therapeutic drug level monitoring: Secondary | ICD-10-CM | POA: Diagnosis not present

## 2024-06-29 DIAGNOSIS — E785 Hyperlipidemia, unspecified: Secondary | ICD-10-CM | POA: Diagnosis not present

## 2024-06-30 DIAGNOSIS — M533 Sacrococcygeal disorders, not elsewhere classified: Secondary | ICD-10-CM | POA: Diagnosis not present

## 2024-06-30 DIAGNOSIS — M545 Low back pain, unspecified: Secondary | ICD-10-CM | POA: Diagnosis not present

## 2024-06-30 DIAGNOSIS — M5136 Other intervertebral disc degeneration, lumbar region with discogenic back pain only: Secondary | ICD-10-CM | POA: Diagnosis not present

## 2024-06-30 LAB — HEPATIC FUNCTION PANEL
ALT: 23 IU/L (ref 0–32)
AST: 26 IU/L (ref 0–40)
Albumin: 4.6 g/dL (ref 3.8–4.8)
Alkaline Phosphatase: 60 IU/L (ref 44–121)
Bilirubin Total: 0.6 mg/dL (ref 0.0–1.2)
Bilirubin, Direct: 0.2 mg/dL (ref 0.00–0.40)
Total Protein: 6.6 g/dL (ref 6.0–8.5)

## 2024-06-30 LAB — LIPID PANEL
Chol/HDL Ratio: 3.3 ratio (ref 0.0–4.4)
Cholesterol, Total: 150 mg/dL (ref 100–199)
HDL: 45 mg/dL (ref >39)
LDL Chol Calc (NIH): 85 mg/dL (ref 0–99)
Triglycerides: 112 mg/dL (ref 0–149)
VLDL Cholesterol Cal: 20 mg/dL (ref 5–40)

## 2024-07-01 ENCOUNTER — Ambulatory Visit (INDEPENDENT_AMBULATORY_CARE_PROVIDER_SITE_OTHER)

## 2024-07-01 DIAGNOSIS — J309 Allergic rhinitis, unspecified: Secondary | ICD-10-CM | POA: Diagnosis not present

## 2024-07-01 NOTE — Addendum Note (Signed)
 Addended by: Joshlyn Beadle N on: 07/01/2024 10:55 AM   Modules accepted: Orders

## 2024-07-16 DIAGNOSIS — Z23 Encounter for immunization: Secondary | ICD-10-CM | POA: Diagnosis not present

## 2024-07-19 DIAGNOSIS — M545 Low back pain, unspecified: Secondary | ICD-10-CM | POA: Diagnosis not present

## 2024-07-30 ENCOUNTER — Ambulatory Visit (INDEPENDENT_AMBULATORY_CARE_PROVIDER_SITE_OTHER)

## 2024-07-30 DIAGNOSIS — J309 Allergic rhinitis, unspecified: Secondary | ICD-10-CM

## 2024-08-02 DIAGNOSIS — M545 Low back pain, unspecified: Secondary | ICD-10-CM | POA: Diagnosis not present

## 2024-08-05 ENCOUNTER — Encounter: Payer: Self-pay | Admitting: Pharmacist Clinician (PhC)/ Clinical Pharmacy Specialist

## 2024-08-05 ENCOUNTER — Ambulatory Visit: Attending: Cardiovascular Disease | Admitting: Pharmacist Clinician (PhC)/ Clinical Pharmacy Specialist

## 2024-08-05 DIAGNOSIS — E7800 Pure hypercholesterolemia, unspecified: Secondary | ICD-10-CM

## 2024-08-05 NOTE — Patient Instructions (Signed)
 Your Results:             Your most recent labs Goal  Total Cholesterol 150 < 200  Triglycerides 112 < 150  HDL (happy/good cholesterol) 45 > 40  LDL (lousy/bad cholesterol 85 < 55 (per MD)   Please send me a MyChart message once you get your cholesterol labs drawn in November.  Once I review those results, I will message you back and we can discuss which option is best for you.  (Re-challenge with Crestor , try Repatha/Praluent, Nexletol or Leqvio   Feel free to reach out to me with any questions or conerns   Thank you for choosing [facility]  Allean Mink PharmD

## 2024-08-05 NOTE — Assessment & Plan Note (Signed)
 Assessment: Patient with ASCVD not at LDL goal of < 55 (MD goal) Most recent LDL 85 on 06/29/24 Has been compliant with moderate intensity statin : pravastatin  80 mg daily  Not able to tolerate rosuvastatin  40 mg secondary to myalgias Reviewed options for lowering LDL cholesterol, including ezetimibe, PCSK-9 inhibitors, bempedoic acid and inclisiran.  Discussed mechanisms of action, dosing, side effects, potential decreases in LDL cholesterol and costs.  Also reviewed potential options for patient assistance.  Plan: Patient would like to wait for repeat lab draw, scheduled for early November. Once results reviewed, will reach out to patient via MyChart and determine which option is best for further LDL lowering.

## 2024-08-05 NOTE — Progress Notes (Signed)
 Office Visit    Patient Name: Carrie Cox Date of Encounter: 08/05/2024  Primary Care Provider:  Chrystal Lamarr RAMAN, MD Primary Cardiologist:  Lynwood Schilling, MD  Chief Complaint    Hyperlipidemia   Significant Past Medical History   CAD CAC 939 (92nd percentile), 25-49% stenosis in RCA, LM and LAD (not flow limiting)  HTN Controlled on amlodipine, metoprolol      Allergies  Allergen Reactions   Bee Venom Anaphylaxis    Allergic to yellow jackets   Nsaids Nausea Only    Terrible pains in stomach Other reaction(s): abdominal pain   Shellfish Allergy  Anaphylaxis   Diphenhydramine  Other (See Comments)    Other reaction(s): paradoxical reaction, restlessness   Other Nausea And Vomiting    Pain meds in general --Nausea and vomiting Artificial Sweetners- vomiting and swelling Anti-inflammatories - can't take oral anti-inflammatories it hurts my stomach - Nausea and stabbing stomach pain    Edamame-Severe Rash/hives Other reaction(s): N/V, swelling   Soy Allergy  (Obsolete) Hives   Aloe Rash    Red rash (sunburn type)   Codeine Nausea And Vomiting   Doxycycline Nausea Only    Stomach pain   Erythromycin Nausea Only    Stomach pain   Soybean (Diagnostic) Nausea And Vomiting and Rash    Other reaction(s): Vomiting, Rash,    History of Present Illness    Carrie Cox is a 78 y.o. female patient of Dr Schilling, in the office today to discuss options for cholesterol management.  Crestor  20 dropped to 41 LDL, leg cramping, tolerated pravastatin , ldl went up to 60's, so doubled to 80 mg went to 79 Concern for pre-dm, breathing issues, 2/2 asthma Insurance Carrier: [employer] 1 [phone]  Pharmacy:     [facility]:      LDL Cholesterol goal:  LDL < 70  Current Medications:   pravastatin  80 mg daily  Previously tried:    Family Hx:   both parents with CAD, dad had NTG, mother died from , both were 66i; sister with MVP; children healthy  Social  Hx: Tobacco:  no Alcohol:    few times per week  Diet:   works with nutritionist; has stopped all meat, does eat chicken (cnat' tolerat fatty fish), allergy  to soy.  Had low iron; lots of f/v, salads daily  (Bernadette collins); no snacks;    Exercise: works out daily, treadmill, recumbant bike, then twice weekly tai chi, [place], walk dogs    Accessory Clinical Findings   Lab Results  Component Value Date   CHOL 150 06/29/2024   HDL 45 06/29/2024   LDLCALC 85 06/29/2024   TRIG 112 06/29/2024   CHOLHDL 3.3 06/29/2024    Lipoprotein (a)  Date/Time Value Ref Range Status  03/23/2024 09:12 AM 9.3 <75.0 nmol/L Final    Comment:    Note:  Values greater than or equal to 75.0 nmol/L may        indicate an independent risk factor for CHD,        but must be evaluated with caution when applied        to non-Caucasian populations due to the        influence of genetic factors on Lp(a) across        ethnicities.     Lab Results  Component Value Date   ALT 23 06/29/2024   AST 26 06/29/2024   ALKPHOS 60 06/29/2024   BILITOT 0.6 06/29/2024   Lab Results  Component Value Date  CREATININE 0.81 09/15/2018   BUN 15 09/15/2018   NA 139 09/15/2018   K 4.1 09/15/2018   CL 103 09/15/2018   CO2 29 09/15/2018   No results found for: HGBA1C  Home Medications    Current Outpatient Medications  Medication Sig Dispense Refill   Albuterol -Budesonide (AIRSUPRA ) 90-80 MCG/ACT AERO Inhale 2 Inhalations into the lungs every 4 (four) hours as needed. 11 g 1   ALPRAZolam  (XANAX ) 0.25 MG tablet Take 0.25 mg by mouth at bedtime as needed for anxiety.     amLODipine (NORVASC) 5 MG tablet TAKE 1 TABLET BY MOUTH EVERY DAY FOR 90 DAYS     aspirin  EC 81 MG tablet Take 1 tablet (81 mg total) by mouth daily. Swallow whole.     Azelastine  HCl 137 MCG/SPRAY SOLN Place 1 spray into the nose in the morning and at bedtime. 90 mL 3   chlorhexidine (PERIDEX) 0.12 % solution RINSE WITH 1/2 OUNCE TWICE A DAY  AFTER BREAKFAST AND BEFORE BEDTIME     Cholecalciferol (VITAMIN D PO) Take 2,000 Units by mouth daily.     Cyanocobalamin  (VITAMIN B 12 PO) Take 1,000 mg by mouth daily.     cyclobenzaprine (FLEXERIL) 10 MG tablet Take 10 mg by mouth at bedtime as needed.     DiphenhydrAMINE  HCl (BENADRYL  PO) Take by mouth as needed.     EPINEPHrine  0.3 mg/0.3 mL IJ SOAJ injection Inject 0.3 mg into the muscle as needed for anaphylaxis. INJECT 0.3 MLS (0.3 MG TOTAL) INTO THE MUSCLE ONCE FOR 1 DOSE. 2 each 1   escitalopram  (LEXAPRO ) 10 MG tablet Take 10 mg by mouth daily.      Ferrous Sulfate Dried (SLOW RELEASE IRON) 45 MG TBCR      fexofenadine  (ALLEGRA ) 180 MG tablet Take 1 tablet (180 mg total) by mouth daily as needed for allergies or rhinitis (Can take an extra dose during flare ups.). 180 tablet 3   Flaxseed, Linseed, (FLAX SEED OIL) 1000 MG CAPS as directed Orally     fluticasone  (FLONASE ) 50 MCG/ACT nasal spray Place 2 sprays into both nostrils daily. 48 g 3   folic acid  (FOLVITE ) 1 MG tablet Take 1 mg by mouth daily.     Magnesium 200 MG TABS 2 tablets with a meal Orally Once a day     metoprolol  tartrate (LOPRESSOR ) 100 MG tablet Take 2 hours prior to CT scan 1 tablet 0   montelukast  (SINGULAIR ) 10 MG tablet Take 1 tablet (10 mg total) by mouth daily. 90 tablet 3   NONFORMULARY OR COMPOUNDED ITEM 0.5 mLs. Allergy  Injections     pravastatin  (PRAVACHOL ) 80 MG tablet Take 1 tablet (80 mg total) by mouth every evening. 90 tablet 3   traZODone (DESYREL) 50 MG tablet Take 50 mg by mouth at bedtime as needed.     valACYclovir  (VALTREX ) 1000 MG tablet Take 1,000 mg by mouth daily.     Vitamin C 250 MG TABS Take 250 mg by mouth in the morning and at bedtime.     YUVAFEM  10 MCG TABS vaginal tablet PLACE 1 TABLET VAGINALLY 2 TIMES A WEEK. 24 tablet 3   No current facility-administered medications for this visit.     Assessment & Plan    Pure hypercholesterolemia Assessment: Patient with ASCVD not at LDL  goal of < 55 (MD goal) Most recent LDL 85 on 06/29/24 Has been compliant with moderate intensity statin : pravastatin  80 mg daily  Not able to tolerate rosuvastatin  40 mg  secondary to myalgias Reviewed options for lowering LDL cholesterol, including ezetimibe, PCSK-9 inhibitors, bempedoic acid and inclisiran.  Discussed mechanisms of action, dosing, side effects, potential decreases in LDL cholesterol and costs.  Also reviewed potential options for patient assistance.  Plan: Patient would like to wait for repeat lab draw, scheduled for early November. Once results reviewed, will reach out to patient via MyChart and determine which option is best for further LDL lowering.     Carrie Cox, PharmD CPP [facility] [address]   [place], [place] [address] [phone]  08/05/2024, 10:56 AM

## 2024-08-06 ENCOUNTER — Ambulatory Visit

## 2024-08-06 DIAGNOSIS — M545 Low back pain, unspecified: Secondary | ICD-10-CM | POA: Diagnosis not present

## 2024-08-06 DIAGNOSIS — J309 Allergic rhinitis, unspecified: Secondary | ICD-10-CM | POA: Diagnosis not present

## 2024-08-10 DIAGNOSIS — M545 Low back pain, unspecified: Secondary | ICD-10-CM | POA: Diagnosis not present

## 2024-08-13 ENCOUNTER — Ambulatory Visit

## 2024-08-13 DIAGNOSIS — J309 Allergic rhinitis, unspecified: Secondary | ICD-10-CM

## 2024-08-18 DIAGNOSIS — M545 Low back pain, unspecified: Secondary | ICD-10-CM | POA: Diagnosis not present

## 2024-08-23 ENCOUNTER — Ambulatory Visit

## 2024-08-23 DIAGNOSIS — J309 Allergic rhinitis, unspecified: Secondary | ICD-10-CM

## 2024-08-30 DIAGNOSIS — M545 Low back pain, unspecified: Secondary | ICD-10-CM | POA: Diagnosis not present

## 2024-09-05 DIAGNOSIS — E785 Hyperlipidemia, unspecified: Secondary | ICD-10-CM | POA: Insufficient documentation

## 2024-09-05 DIAGNOSIS — R002 Palpitations: Secondary | ICD-10-CM | POA: Insufficient documentation

## 2024-09-05 NOTE — Progress Notes (Unsigned)
  Cardiology Office Note:   Date:  09/05/2024  ID:  Carrie Cox, DOB [birth year], MRN [id number] PCP: Chrystal Lamarr RAMAN, MD  [facility] Providers Cardiologist:  Lynwood Schilling, MD {  History of Present Illness:   Carrie Cox is a 78 y.o. female who was referred by Chrystal Lamarr RAMAN, * for evaluation of palpitations.  She had a monitor that demonstrated no sustained arrhythmias.  There were occasional premature atrial contractions.  She had a calcium  score that was elevated.  This was 483.  She was having some shortness of breath.  I sent her for coronary CTA which demonstrated mild 25 to 49% stenosis in the RCA left main and LAD.  This was not flow-limiting by FFR.  There was less severe plaque in the left circumflex.  ***   ***  She returns today to discuss this.  She has been active and not having any new symptoms.  He is not having any chest pressure, neck or arm discomfort.  She has been taking statin as prescribed recently and has changed her diet.    ROS: ***  Studies Reviewed:    EKG:       ***  Risk Assessment/Calculations:   {Does this patient have ATRIAL FIBRILLATION?:[phone]} No BP recorded.  {Refresh Note OR Click here to enter BP  :1}***        Physical Exam:   VS:  There were no vitals taken for this visit.   Wt Readings from Last 3 Encounters:  05/18/24 130 lb 11.2 oz (59.3 kg)  03/26/24 133 lb 11.2 oz (60.6 kg)  12/10/23 131 lb 3.2 oz (59.5 kg)     GEN: Well nourished, well developed in no acute distress NECK: No JVD; No carotid bruits CARDIAC: ***RR, *** murmurs, rubs, gallops RESPIRATORY:  Clear to auscultation without rales, wheezing or rhonchi  ABDOMEN: Soft, non-tender, non-distended EXTREMITIES:  No edema; No deformity   ASSESSMENT AND PLAN:   Palpitations:  ***   She had no significant arrhythmias on monitor.    No change in therapy.    CAD:   ***   We reviewed this at length today.  No change in therapy.  She is aware to  let me know if she has any symptoms.  We are going to pursue aggressive primary risk reduction.   Dyslipidemia:   ***  We will follow-up with a lipid profile 3 months after starting a statin and the goal LDL will be in the 50s.  I will also check an LP(a).        Follow up ***  Signed, Lynwood Schilling, MD

## 2024-09-06 ENCOUNTER — Ambulatory Visit: Attending: Cardiology | Admitting: Cardiology

## 2024-09-06 ENCOUNTER — Encounter: Payer: Self-pay | Admitting: Cardiology

## 2024-09-06 ENCOUNTER — Ambulatory Visit: Payer: PPO | Admitting: Internal Medicine

## 2024-09-06 VITALS — BP 128/60 | HR 78 | Resp 16 | Ht 63.0 in | Wt 136.4 lb

## 2024-09-06 DIAGNOSIS — R002 Palpitations: Secondary | ICD-10-CM

## 2024-09-06 DIAGNOSIS — I2510 Atherosclerotic heart disease of native coronary artery without angina pectoris: Secondary | ICD-10-CM

## 2024-09-06 DIAGNOSIS — E785 Hyperlipidemia, unspecified: Secondary | ICD-10-CM

## 2024-09-06 NOTE — Patient Instructions (Signed)
 Medication Instructions:  Your physician recommends that you continue on your current medications as directed. Please refer to the Current Medication list given to you today.  *If you need a refill on your cardiac medications before your next appointment, please call your pharmacy*  Lab Work: Fasting lipid panel at [facility] If you have labs (blood work) drawn today and your tests are completely normal, you will receive your results only by: MyChart Message (if you have MyChart) OR A paper copy in the mail If you have any lab test that is abnormal or we need to change your treatment, we will call you to review the results.  Testing/Procedures: NONE  Follow-Up: At [facility], you and your health needs are our priority.  As part of our continuing mission to provide you with exceptional heart care, our providers are all part of one team.  This team includes your primary Cardiologist (physician) and Advanced Practice Providers or APPs (Physician Assistants and Nurse Practitioners) who all work together to provide you with the care you need, when you need it.  Your next appointment:   1 year(s)  Provider:   Lynwood Schilling, MD    We recommend signing up for the patient portal called MyChart.  Sign up information is provided on this After Visit Summary.  MyChart is used to connect with patients for Virtual Visits (Telemedicine).  Patients are able to view lab/test results, encounter notes, upcoming appointments, etc.  Non-urgent messages can be sent to your provider as well.   To learn more about what you can do with MyChart, go to [email or web address].

## 2024-09-07 ENCOUNTER — Ambulatory Visit: Admitting: Internal Medicine

## 2024-09-07 DIAGNOSIS — M545 Low back pain, unspecified: Secondary | ICD-10-CM | POA: Diagnosis not present

## 2024-09-10 DIAGNOSIS — E785 Hyperlipidemia, unspecified: Secondary | ICD-10-CM | POA: Diagnosis not present

## 2024-09-10 LAB — LIPID PANEL
Chol/HDL Ratio: 2.9 ratio (ref 0.0–4.4)
Cholesterol, Total: 137 mg/dL (ref 100–199)
HDL: 47 mg/dL (ref >39)
LDL Chol Calc (NIH): 71 mg/dL (ref 0–99)
Triglycerides: 103 mg/dL (ref 0–149)
VLDL Cholesterol Cal: 19 mg/dL (ref 5–40)

## 2024-09-11 ENCOUNTER — Encounter: Payer: Self-pay | Admitting: Pharmacist Clinician (PhC)/ Clinical Pharmacy Specialist

## 2024-09-12 ENCOUNTER — Ambulatory Visit: Payer: Self-pay | Admitting: Cardiology

## 2024-09-12 DIAGNOSIS — E785 Hyperlipidemia, unspecified: Secondary | ICD-10-CM

## 2024-09-13 MED ORDER — ROSUVASTATIN CALCIUM 20 MG PO TABS: 20.0000 mg | ORAL_TABLET | Freq: Every day | ORAL | 3 refills | Status: DC

## 2024-09-13 MED ORDER — EZETIMIBE 10 MG PO TABS: 10.0000 mg | ORAL_TABLET | Freq: Every day | ORAL | 3 refills | Status: AC

## 2024-09-13 NOTE — Progress Notes (Unsigned)
 Subjective:     Patient ID: Carrie Cox, female   DOB: 20-Aug-1946, 78 y.o.   MRN: [id number]  HPI F never smoker followed  for chronic rhinitis and asthma.  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 Notes some dry cough now doing yard work.  I told her that would be more commonly a symptom related to respiratory issues, leaf dust etc. and less likely to be cardiac.  She is not having chest pain or ankle edema but has had occasional palpitation.  Apparently she wore a monitor that did not show anything significant. She asks refill for Z-Pak which she keeps available during the winter, and Singulair .  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   Recent normal spirometry -AirSupra , Xopenex  hfa, Singulair , Flonase , Allegra    ROS-see HPI    + = positive Constitutional:   No-   weight loss, night sweats, fevers, chills, fatigue, lassitude. HEENT:   No-  headaches, difficulty swallowing, tooth/dental problems, sore throat,       No-  sneezing, itching, ear ache, nasal congestion, post nasal drip,  CV:  No-   chest pain, orthopnea, PND, swelling in lower extremities, anasarca,                                                          dizziness, +palpitations Resp: No-   shortness of breath with exertion or at rest.              No-   productive cough,  + non-productive cough,  No- coughing up of blood.              No-   change in color of  mucus.  No- wheezing.   Skin: No-   rash or lesions. GI:  No-   heartburn, indigestion, abdominal pain, nausea, vomiting,  GU:  MS:  No-   joint pain or swelling.  Neuro-     nothing unusual Psych:  No- change in mood or affect. No depression or anxiety.  No memory loss.  OBJ- Physical Exam General- Alert, Oriented, Affect-appropriate, Distress- none acute Skin- rash-none, lesions- none, excoriation- none Lymphadenopathy- none Head- atraumatic            Eyes- Gross vision intact, PERRLA, conjunctivae and secretions clear            Ears- Hearing, canals-normal            Nose- Clear, no-Septal dev, mucus, polyps, erosion, perforation             Throat- Mallampati II , mucosa clear , drainage- none, tonsils- atrophic Neck- flexible , trachea midline, no stridor , thyroid  nl, carotid no bruit Chest - symmetrical excursion , unlabored           Heart/CV- RRR , no murmur , no gallop  , no rub, nl s1 s2                           -  JVD- none , edema- none, stasis changes- none, varices- none           Lung- clear to P&A, wheeze- none, cough- none , dullness-none, rub- none           Chest wall-  Abd-  Br/ Gen/ Rectal- Not done, not indicated Extrem- cyanosis- none, clubbing, none, atrophy- none, strength- nl, Neuro- grossly intact to observation

## 2024-09-13 NOTE — Telephone Encounter (Signed)
-----   Message from Lynwood Schilling sent at 09/12/2024  4:36 PM EST ----- LDL is not quite at target.  She is supposed to have follow up [facility].  I would suggest Crestor  20 mg po daily with Zetia and repeat lipid in 3 months then follow with Lipid Clinic ----- Message ----- From: Interface, [facility] Lab Results In Sent: 09/10/2024  11:36 PM EST To: Lynwood Schilling, MD

## 2024-09-13 NOTE — Telephone Encounter (Signed)
 Spoke with pt regarding her results. Pt agreeable to plan. Prescriptions sent. Labs ordered and released. Pt verbalized understanding. All questions if any were answered.

## 2024-09-14 ENCOUNTER — Encounter: Payer: Self-pay | Admitting: Internal Medicine

## 2024-09-14 ENCOUNTER — Ambulatory Visit: Admitting: Internal Medicine

## 2024-09-14 VITALS — BP 124/58 | HR 77 | Temp 98.0°F | Ht 63.0 in | Wt 134.2 lb

## 2024-09-14 DIAGNOSIS — J4520 Mild intermittent asthma, uncomplicated: Secondary | ICD-10-CM | POA: Diagnosis not present

## 2024-09-14 DIAGNOSIS — J302 Other seasonal allergic rhinitis: Secondary | ICD-10-CM | POA: Diagnosis not present

## 2024-09-14 DIAGNOSIS — J3089 Other allergic rhinitis: Secondary | ICD-10-CM

## 2024-09-14 NOTE — Patient Instructions (Addendum)
 Glad you are doing well  Order- samples AYR nasal saline gel- small amount in each nostril as needed  If it doesn't get better- I suggest ENT [facility] on [address]  You can get future med refills through your Allergy  office. Our group will be here if you need.

## 2024-09-15 DIAGNOSIS — M545 Low back pain, unspecified: Secondary | ICD-10-CM | POA: Diagnosis not present

## 2024-09-15 NOTE — Assessment & Plan Note (Signed)
 Doing well Plan- f/u with her Allergy  provider for asthma and rhinitis

## 2024-09-15 NOTE — Assessment & Plan Note (Signed)
 Feels irritation L nostril. Turbinate may be inflamed Plan- aline nasal gel. Ifno better, suggest ENT look t it.

## 2024-09-17 ENCOUNTER — Telehealth: Payer: Self-pay | Admitting: *Deleted

## 2024-09-17 NOTE — Telephone Encounter (Signed)
 Patient left message requesting assistance with scheduling BMD in 10/2024.

## 2024-09-20 ENCOUNTER — Ambulatory Visit (INDEPENDENT_AMBULATORY_CARE_PROVIDER_SITE_OTHER)

## 2024-09-20 DIAGNOSIS — J309 Allergic rhinitis, unspecified: Secondary | ICD-10-CM | POA: Diagnosis not present

## 2024-09-20 MED ORDER — ROSUVASTATIN CALCIUM 10 MG PO TABS: 10.0000 mg | ORAL_TABLET | Freq: Every day | ORAL | 3 refills | Status: AC

## 2024-09-20 NOTE — Addendum Note (Signed)
 Addended by: Takyah Ciaramitaro L on: 09/20/2024 04:29 PM   Modules accepted: Orders

## 2024-09-20 NOTE — Telephone Encounter (Signed)
 Patient states she received a letter from our office saying that she needed to call to schedule her Dexa scan for 1/26. Per last bone density report that was done on 12-05-23, it says to repeat in 2 yrs. Pt is aware Dr Glennon is out of the office this week. Please advise.

## 2024-09-20 NOTE — Telephone Encounter (Signed)
 Per last appt note, patient wanted to recheck her DXA in 1 yr. If she does, she can schedule. Otherwise place recal for Feb 2027

## 2024-09-21 NOTE — Telephone Encounter (Signed)
 I spoke with patient. She asked how much her bone density would be if she repeated it in 2026 rather than 2027. I told her she would need to call her insurance company and speak with them about the cost. I told her to call us  back and let us  know if she would like to do the dexa scan in 2026 so we could place the order. She agreed.

## 2024-09-21 NOTE — Telephone Encounter (Signed)
 Left message to call back.

## 2024-09-22 NOTE — Telephone Encounter (Signed)
 Patient called and states that she called her insurance about pricing of Dexa and was told to call us . Patient is wanting the provider to say dexa is medically neccessary to be done in one year so that it will be covered. Please advise.

## 2024-09-22 NOTE — Progress Notes (Signed)
 Called patient to schedule her an appointment with the pharmD on 11/30/24 @ 10:15. Patient stated she will get her lipid due that day also.

## 2024-10-18 ENCOUNTER — Ambulatory Visit

## 2024-10-18 DIAGNOSIS — J309 Allergic rhinitis, unspecified: Secondary | ICD-10-CM | POA: Diagnosis not present

## 2024-11-09 ENCOUNTER — Ambulatory Visit

## 2024-11-09 DIAGNOSIS — J3089 Other allergic rhinitis: Secondary | ICD-10-CM

## 2024-11-09 DIAGNOSIS — J302 Other seasonal allergic rhinitis: Secondary | ICD-10-CM | POA: Diagnosis not present

## 2024-11-28 ENCOUNTER — Encounter: Payer: Self-pay | Admitting: Pharmacist Clinician (PhC)/ Clinical Pharmacy Specialist

## 2024-11-30 ENCOUNTER — Ambulatory Visit: Admitting: Pharmacist Clinician (PhC)/ Clinical Pharmacy Specialist

## 2025-05-17 ENCOUNTER — Ambulatory Visit: Admitting: Allergy and Immunology
